# Patient Record
Sex: Female | Born: 1950 | Race: White | Hispanic: No | Marital: Married | Smoking: Former smoker
Health system: Southern US, Community
[De-identification: ages and names within clinical notes are randomized; demographics above are authoritative.]

## PROBLEM LIST (undated history)

## (undated) DIAGNOSIS — Z87442 Personal history of urinary calculi: Secondary | ICD-10-CM

## (undated) DIAGNOSIS — T8859XA Other complications of anesthesia, initial encounter: Secondary | ICD-10-CM

## (undated) DIAGNOSIS — K8020 Calculus of gallbladder without cholecystitis without obstruction: Secondary | ICD-10-CM

## (undated) DIAGNOSIS — Z8632 Personal history of gestational diabetes: Secondary | ICD-10-CM

## (undated) DIAGNOSIS — M3500 Sicca syndrome, unspecified: Secondary | ICD-10-CM

## (undated) DIAGNOSIS — M545 Other chronic pain: Secondary | ICD-10-CM

## (undated) DIAGNOSIS — G894 Chronic pain syndrome: Secondary | ICD-10-CM

## (undated) DIAGNOSIS — G8929 Other chronic pain: Secondary | ICD-10-CM

## (undated) DIAGNOSIS — T4145XA Adverse effect of unspecified anesthetic, initial encounter: Secondary | ICD-10-CM

## (undated) DIAGNOSIS — N189 Chronic kidney disease, unspecified: Secondary | ICD-10-CM

## (undated) DIAGNOSIS — K389 Disease of appendix, unspecified: Secondary | ICD-10-CM

## (undated) DIAGNOSIS — Z9889 Other specified postprocedural states: Secondary | ICD-10-CM

## (undated) DIAGNOSIS — M797 Fibromyalgia: Secondary | ICD-10-CM

## (undated) DIAGNOSIS — E079 Disorder of thyroid, unspecified: Secondary | ICD-10-CM

## (undated) DIAGNOSIS — G43909 Migraine, unspecified, not intractable, without status migrainosus: Secondary | ICD-10-CM

## (undated) DIAGNOSIS — R51 Headache: Secondary | ICD-10-CM

## (undated) DIAGNOSIS — K219 Gastro-esophageal reflux disease without esophagitis: Secondary | ICD-10-CM

## (undated) DIAGNOSIS — M1990 Unspecified osteoarthritis, unspecified site: Secondary | ICD-10-CM

## (undated) DIAGNOSIS — E7800 Pure hypercholesterolemia, unspecified: Secondary | ICD-10-CM

## (undated) DIAGNOSIS — R112 Nausea with vomiting, unspecified: Secondary | ICD-10-CM

## (undated) DIAGNOSIS — T7840XA Allergy, unspecified, initial encounter: Secondary | ICD-10-CM

## (undated) DIAGNOSIS — I10 Essential (primary) hypertension: Secondary | ICD-10-CM

## (undated) HISTORY — DX: Essential (primary) hypertension: I10

## (undated) HISTORY — PX: OTHER SURGICAL HISTORY: SHX169

## (undated) HISTORY — PX: APPENDECTOMY: SHX54

## (undated) HISTORY — DX: Migraine, unspecified, not intractable, without status migrainosus: G43.909

## (undated) HISTORY — DX: Chronic kidney disease, unspecified: N18.9

## (undated) HISTORY — DX: Pure hypercholesterolemia, unspecified: E78.00

## (undated) HISTORY — DX: Disorder of thyroid, unspecified: E07.9

## (undated) HISTORY — DX: Allergy, unspecified, initial encounter: T78.40XA

## (undated) HISTORY — DX: Gastro-esophageal reflux disease without esophagitis: K21.9

## (undated) HISTORY — DX: Unspecified osteoarthritis, unspecified site: M19.90

## (undated) HISTORY — DX: Headache: R51

---
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
## 1996-02-29 HISTORY — PX: ABDOMINAL HYSTERECTOMY: SHX81

## 2003-03-01 HISTORY — PX: LUMBAR FUSION: SHX111

## 2006-08-30 LAB — HM COLONOSCOPY: HM Colonoscopy: NORMAL

## 2009-02-28 HISTORY — PX: ANTERIOR CERVICAL DECOMP/DISCECTOMY FUSION: SHX1161

## 2009-02-28 HISTORY — PX: KIDNEY STONE SURGERY: SHX686

## 2011-06-30 ENCOUNTER — Other Ambulatory Visit (INDEPENDENT_AMBULATORY_CARE_PROVIDER_SITE_OTHER): Payer: BC Managed Care – PPO

## 2011-06-30 ENCOUNTER — Ambulatory Visit (INDEPENDENT_AMBULATORY_CARE_PROVIDER_SITE_OTHER): Payer: BC Managed Care – PPO | Admitting: Internal Medicine

## 2011-06-30 ENCOUNTER — Encounter: Payer: Self-pay | Admitting: Internal Medicine

## 2011-06-30 VITALS — BP 150/88 | HR 80 | Temp 97.7°F | Resp 16 | Ht 65.0 in | Wt 139.0 lb

## 2011-06-30 DIAGNOSIS — G44229 Chronic tension-type headache, not intractable: Secondary | ICD-10-CM

## 2011-06-30 DIAGNOSIS — IMO0002 Reserved for concepts with insufficient information to code with codable children: Secondary | ICD-10-CM

## 2011-06-30 DIAGNOSIS — J309 Allergic rhinitis, unspecified: Secondary | ICD-10-CM

## 2011-06-30 DIAGNOSIS — Z23 Encounter for immunization: Secondary | ICD-10-CM

## 2011-06-30 DIAGNOSIS — F419 Anxiety disorder, unspecified: Secondary | ICD-10-CM | POA: Insufficient documentation

## 2011-06-30 DIAGNOSIS — I10 Essential (primary) hypertension: Secondary | ICD-10-CM

## 2011-06-30 DIAGNOSIS — Z1231 Encounter for screening mammogram for malignant neoplasm of breast: Secondary | ICD-10-CM | POA: Insufficient documentation

## 2011-06-30 DIAGNOSIS — M3500 Sicca syndrome, unspecified: Secondary | ICD-10-CM

## 2011-06-30 DIAGNOSIS — E7800 Pure hypercholesterolemia, unspecified: Secondary | ICD-10-CM

## 2011-06-30 DIAGNOSIS — G4750 Parasomnia, unspecified: Secondary | ICD-10-CM | POA: Insufficient documentation

## 2011-06-30 DIAGNOSIS — F411 Generalized anxiety disorder: Secondary | ICD-10-CM

## 2011-06-30 LAB — URINALYSIS, ROUTINE W REFLEX MICROSCOPIC
Bilirubin Urine: NEGATIVE
Nitrite: NEGATIVE
pH: 6 (ref 5.0–8.0)

## 2011-06-30 LAB — COMPREHENSIVE METABOLIC PANEL
ALT: 17 U/L (ref 0–35)
AST: 25 U/L (ref 0–37)
Albumin: 4.9 g/dL (ref 3.5–5.2)
Calcium: 10.6 mg/dL — ABNORMAL HIGH (ref 8.4–10.5)
Chloride: 95 mEq/L — ABNORMAL LOW (ref 96–112)
Creatinine, Ser: 1.1 mg/dL (ref 0.4–1.2)
Potassium: 4.9 mEq/L (ref 3.5–5.1)
Sodium: 139 mEq/L (ref 135–145)
Total Protein: 7.9 g/dL (ref 6.0–8.3)

## 2011-06-30 LAB — CBC WITH DIFFERENTIAL/PLATELET
Basophils Absolute: 0.1 10*3/uL (ref 0.0–0.1)
Eosinophils Absolute: 0.2 10*3/uL (ref 0.0–0.7)
Lymphocytes Relative: 31.6 % (ref 12.0–46.0)
MCHC: 33.6 g/dL (ref 30.0–36.0)
Monocytes Relative: 6 % (ref 3.0–12.0)
Neutrophils Relative %: 60 % (ref 43.0–77.0)
Platelets: 267 10*3/uL (ref 150.0–400.0)
RDW: 14.2 % (ref 11.5–14.6)

## 2011-06-30 LAB — LDL CHOLESTEROL, DIRECT: Direct LDL: 184.5 mg/dL

## 2011-06-30 LAB — LIPID PANEL
Total CHOL/HDL Ratio: 4
Triglycerides: 164 mg/dL — ABNORMAL HIGH (ref 0.0–149.0)

## 2011-06-30 MED ORDER — CLONAZEPAM 2 MG PO TABS: 2.0000 mg | ORAL_TABLET | Freq: Every day | ORAL | Status: DC

## 2011-06-30 MED ORDER — OLMESARTAN-AMLODIPINE-HCTZ 40-5-12.5 MG PO TABS: 1.0000 | ORAL_TABLET | Freq: Every day | ORAL | Status: DC

## 2011-06-30 MED ORDER — BUTALBITAL-APAP-CAFFEINE 50-325-40 MG PO TABS: 1.0000 | ORAL_TABLET | Freq: Four times a day (QID) | ORAL | Status: DC | PRN

## 2011-06-30 MED ORDER — MOMETASONE FUROATE 50 MCG/ACT NA SUSP: 2.0000 | Freq: Every day | NASAL | Status: DC

## 2011-06-30 NOTE — Assessment & Plan Note (Signed)
Change her BP meds to tribenzor and today I will check her lytes and renal function and will look for secondary causes as well

## 2011-06-30 NOTE — Assessment & Plan Note (Signed)
Start nasonex 

## 2011-06-30 NOTE — Assessment & Plan Note (Signed)
For an FLP today 

## 2011-06-30 NOTE — Assessment & Plan Note (Signed)
Change to fioricet at her request

## 2011-06-30 NOTE — Assessment & Plan Note (Signed)
Rheum referral at her request 

## 2011-06-30 NOTE — Assessment & Plan Note (Signed)
Continue klonopin as needed 

## 2011-06-30 NOTE — Patient Instructions (Signed)

## 2011-06-30 NOTE — Progress Notes (Signed)
Subjective:    Patient ID: Mary [name], female    DOB: 07-Oct-1950, 61 y.o.   MRN: [id number]  HPI  New to me she has a complicated history and is currently seeing a psychologist, pain doc, endo, rheum, dermatologist. She comes in today seeking better control of her BP and she wants to change fiorinal to fioricet b/c the asa causes nausea. She has a long history of CDH- tension type. Her ha description, frequency, and severity have not changed. 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 She has sacro-iliac pain and recently received injections by Dr. Marilynn Latino in [place]. She was referred to a pain clinic for meds as well.  Review of Systems  Constitutional: Positive for fatigue. Negative for fever, chills, diaphoresis, activity change, appetite change and unexpected weight change.  HENT: Positive for congestion, rhinorrhea, sneezing, postnasal drip and tinnitus. Negative for hearing loss, ear pain, nosebleeds, sore throat, facial swelling, drooling, mouth sores, trouble swallowing, neck pain, neck stiffness, dental problem, voice change, sinus pressure and ear discharge.   Eyes: Positive for photophobia. Negative for pain, discharge, redness, itching and visual disturbance.  Respiratory: Negative for apnea, cough, choking, chest tightness, shortness of breath, wheezing and stridor.   Cardiovascular: Negative for chest pain, palpitations and leg swelling.  Gastrointestinal: Positive for nausea. Negative for vomiting, abdominal pain, diarrhea, constipation, blood in stool, abdominal distention, anal bleeding and rectal pain.  Genitourinary: Negative for dysuria, urgency, frequency, hematuria, flank pain, decreased urine volume, enuresis, difficulty  urinating and dyspareunia.  Musculoskeletal: Positive for myalgias, back pain and arthralgias. Negative for joint swelling and gait problem.  Skin: Negative for color change, pallor, rash and wound.  Neurological: Negative for dizziness, tremors, seizures, syncope, facial asymmetry, speech difficulty, weakness, light-headedness, numbness and headaches.  Hematological: Negative for adenopathy. Does not bruise/bleed easily.  Psychiatric/Behavioral: Positive for sleep disturbance. Negative for suicidal ideas, hallucinations, behavioral problems, confusion, self-injury, dysphoric mood, decreased concentration and agitation. The patient is nervous/anxious. The patient is not hyperactive.        Objective:   Physical Exam  Vitals reviewed. Constitutional: She is oriented to person, place, and time. She appears well-developed and well-nourished. No distress.  HENT:  Head: Normocephalic and atraumatic.  Mouth/Throat: Oropharynx is clear and moist. No oropharyngeal exudate.  Eyes: Conjunctivae are normal. Right eye exhibits no discharge. Left eye exhibits no discharge. No scleral icterus.  Neck: Normal range of motion. Neck supple. No JVD present. No tracheal deviation present. No thyromegaly present.  Cardiovascular: Normal rate, regular rhythm, normal heart sounds and intact distal pulses.  Exam reveals no gallop and no friction rub.   No murmur heard. Pulmonary/Chest: Effort normal and breath sounds normal. No stridor. No respiratory distress. She has no wheezes. She has no rales. She exhibits no tenderness.  Abdominal: Soft. Bowel sounds are normal. She exhibits no distension and no mass. There is no tenderness. There is no rebound and no guarding.  Musculoskeletal: Normal range of motion. She exhibits no edema and no tenderness.  Lymphadenopathy:    She has no cervical adenopathy.  Neurological: She is alert and oriented to person, place, and time. She has normal reflexes. She displays normal  reflexes. She exhibits normal muscle tone. Coordination normal.  Skin:  Skin is warm and dry. No rash noted. She is not diaphoretic. No erythema. No pallor.  Psychiatric: She has a normal mood and affect. Her behavior is normal. Judgment and thought content normal.          Assessment & Plan:

## 2011-07-01 ENCOUNTER — Encounter: Payer: Self-pay | Admitting: Internal Medicine

## 2011-07-04 ENCOUNTER — Telehealth: Payer: Self-pay | Admitting: Internal Medicine

## 2011-07-04 NOTE — Telephone Encounter (Signed)
What is her BP ?

## 2011-07-04 NOTE — Telephone Encounter (Signed)
Pt says she had bad reaction to tribenzor--dizziness weak tired heart rate fast for 3 days--pt ph# [phone]

## 2011-07-11 NOTE — Telephone Encounter (Signed)
Closing phone note, no call back from pt

## 2011-07-13 ENCOUNTER — Telehealth: Payer: Self-pay | Admitting: *Deleted

## 2011-07-13 NOTE — Telephone Encounter (Signed)
Pt states that Dr. Yetta Barre recently changed her BP medication from Lisinopril to Tribenzor and that she had a reaction when she started to take Tribenzor (dizziness, palpitations, weakness). Pt stopped taking the Tribenzor and started to take Lisinopril-HCTZ 20-25 again. Pt has only a few days of pills left of the Lisinopril and wants to know if she should continue taking the medication and if a refill can be sent to her pharmacy. Please advise in TLJ's absence.

## 2011-07-14 MED ORDER — LISINOPRIL-HYDROCHLOROTHIAZIDE 20-25 MG PO TABS: 1.0000 | ORAL_TABLET | Freq: Every day | ORAL | Status: DC

## 2011-07-14 NOTE — Telephone Encounter (Signed)
Pt informed of Rx/pharmacy 

## 2011-07-14 NOTE — Telephone Encounter (Signed)
Ok to refill Lisinopr HCT #30 with 3 ref OV w/Dr Geralyn Flash

## 2011-07-14 NOTE — Telephone Encounter (Signed)
Left message for pt to callback office.  

## 2011-08-01 ENCOUNTER — Ambulatory Visit (HOSPITAL_COMMUNITY)
Admission: RE | Admit: 2011-08-01 | Discharge: 2011-08-01 | Disposition: A | Discharge status: Home / Self Care | Payer: BC Managed Care – PPO | Source: Ambulatory Visit | Attending: Internal Medicine | Admitting: Internal Medicine

## 2011-08-01 DIAGNOSIS — Z1231 Encounter for screening mammogram for malignant neoplasm of breast: Secondary | ICD-10-CM | POA: Insufficient documentation

## 2011-08-10 LAB — HM MAMMOGRAPHY

## 2011-08-11 ENCOUNTER — Encounter: Payer: Self-pay | Admitting: Internal Medicine

## 2011-08-11 ENCOUNTER — Ambulatory Visit (INDEPENDENT_AMBULATORY_CARE_PROVIDER_SITE_OTHER): Payer: BC Managed Care – PPO | Admitting: Internal Medicine

## 2011-08-11 VITALS — BP 128/72 | HR 76 | Temp 97.8°F | Resp 16 | Wt 136.5 lb

## 2011-08-11 DIAGNOSIS — G4750 Parasomnia, unspecified: Secondary | ICD-10-CM

## 2011-08-11 DIAGNOSIS — E7800 Pure hypercholesterolemia, unspecified: Secondary | ICD-10-CM

## 2011-08-11 DIAGNOSIS — M549 Dorsalgia, unspecified: Secondary | ICD-10-CM

## 2011-08-11 DIAGNOSIS — R400 Somnolence: Secondary | ICD-10-CM

## 2011-08-11 DIAGNOSIS — I10 Essential (primary) hypertension: Secondary | ICD-10-CM

## 2011-08-11 DIAGNOSIS — IMO0002 Reserved for concepts with insufficient information to code with codable children: Secondary | ICD-10-CM

## 2011-08-11 DIAGNOSIS — R404 Transient alteration of awareness: Secondary | ICD-10-CM

## 2011-08-11 DIAGNOSIS — G47419 Narcolepsy without cataplexy: Secondary | ICD-10-CM | POA: Insufficient documentation

## 2011-08-11 DIAGNOSIS — M3500 Sicca syndrome, unspecified: Secondary | ICD-10-CM

## 2011-08-11 DIAGNOSIS — G8929 Other chronic pain: Secondary | ICD-10-CM

## 2011-08-11 MED ORDER — MODAFINIL 100 MG PO TABS: 100.0000 mg | ORAL_TABLET | Freq: Every day | ORAL | Status: DC

## 2011-08-11 NOTE — Progress Notes (Signed)
Subjective:    Patient ID: Mary [name], female    DOB: 1950-07-29, 61 y.o.   MRN: [id number]  Hypertension This is a chronic problem. The current episode started more than 1 year ago. The problem has been gradually improving since onset. The problem is controlled. Pertinent negatives include no anxiety, blurred vision, chest pain, headaches, malaise/fatigue, neck pain, orthopnea, palpitations, peripheral edema, PND, shortness of breath or sweats. Agents associated with hypertension include amphetamines. Past treatments include ACE inhibitors and diuretics. The current treatment provides significant improvement. There are no compliance problems.       Review of Systems  Constitutional: Negative for fever, chills, malaise/fatigue, diaphoresis, activity change, appetite change, fatigue and unexpected weight change.  HENT: Negative.  Negative for neck pain.   Eyes: Negative.  Negative for blurred vision.  Respiratory: Negative for apnea, cough, choking, chest tightness, shortness of breath, wheezing and stridor.   Cardiovascular: Negative for chest pain, palpitations, orthopnea, leg swelling and PND.  Gastrointestinal: Negative for nausea, vomiting, abdominal pain, diarrhea, constipation, blood in stool, abdominal distention, anal bleeding and rectal pain.  Genitourinary: Negative.   Musculoskeletal: Positive for back pain (chronic, unchanged). Negative for myalgias, joint swelling, arthralgias and gait problem.  Skin: Negative for color change, pallor, rash and wound.  Neurological: Negative for dizziness, tremors, seizures, syncope, facial asymmetry, speech difficulty, weakness, light-headedness, numbness and headaches.  Hematological: Negative for adenopathy. Does not bruise/bleed easily.  Psychiatric/Behavioral: Positive for disturbed wake/sleep cycle (sleep disturbance at night and excessively sleep during the day) and decreased concentration. Negative for suicidal ideas, hallucinations,  behavioral problems, confusion, self-injury, dysphoric mood and agitation. The patient is not nervous/anxious and is not hyperactive.        Objective:   Physical Exam  Vitals reviewed. Constitutional: She is oriented to person, place, and time. She appears well-developed and well-nourished. No distress.  HENT:  Head: Normocephalic and atraumatic.  Mouth/Throat: Oropharynx is clear and moist. No oropharyngeal exudate.  Eyes: Conjunctivae are normal. Right eye exhibits no discharge. Left eye exhibits no discharge. No scleral icterus.  Neck: Normal range of motion. Neck supple. No JVD present. No tracheal deviation present. No thyromegaly present.  Cardiovascular: Normal rate, regular rhythm, normal heart sounds and intact distal pulses.  Exam reveals no gallop and no friction rub.   No murmur heard. Pulmonary/Chest: Effort normal and breath sounds normal. No stridor. No respiratory distress. She has no wheezes. She has no rales. She exhibits no tenderness.  Abdominal: Soft. Bowel sounds are normal. She exhibits no distension. There is no tenderness. There is no rebound and no guarding.  Musculoskeletal: Normal range of motion. She exhibits no edema and no tenderness.  Lymphadenopathy:    She has no cervical adenopathy.  Neurological: She is oriented to person, place, and time.  Skin: Skin is warm and dry. No rash noted. She is not diaphoretic. No erythema. No pallor.  Psychiatric: She has a normal mood and affect. Her behavior is normal. Judgment and thought content normal.      Lab Results  Component Value Date   WBC 10.4 06/30/2011   HGB 14.8 06/30/2011   HCT 44.1 06/30/2011   PLT 267.0 06/30/2011   GLUCOSE 109* 06/30/2011   CHOL 267* 06/30/2011   TRIG 164.0* 06/30/2011   HDL 72.80 06/30/2011   LDLDIRECT 184.5 06/30/2011   ALT 17 06/30/2011   AST 25 06/30/2011   NA 139 06/30/2011   K 4.9 06/30/2011   CL 95* 06/30/2011   CREATININE 1.1 06/30/2011  BUN 32* 06/30/2011   CO2 31 06/30/2011   TSH 1.23  06/30/2011      Assessment & Plan:

## 2011-08-11 NOTE — Assessment & Plan Note (Addendum)
No treatment for now at her request, she will improve on lifestyle modifications with diet, exercise, and weight loss

## 2011-08-11 NOTE — Assessment & Plan Note (Signed)
She uses provigil for this and it works well

## 2011-08-11 NOTE — Patient Instructions (Signed)

## 2011-08-11 NOTE — Assessment & Plan Note (Signed)
She wants to see a rheumatologist

## 2011-08-11 NOTE — Assessment & Plan Note (Signed)
Pain clinic referral 

## 2011-08-11 NOTE — Assessment & Plan Note (Signed)
Her BP is well controlled 

## 2011-08-12 ENCOUNTER — Other Ambulatory Visit: Payer: Self-pay | Admitting: Internal Medicine

## 2011-08-12 DIAGNOSIS — R928 Other abnormal and inconclusive findings on diagnostic imaging of breast: Secondary | ICD-10-CM

## 2011-08-19 ENCOUNTER — Encounter: Payer: Self-pay | Admitting: Physical Medicine and Rehabilitation

## 2011-08-22 ENCOUNTER — Ambulatory Visit
Admission: RE | Admit: 2011-08-22 | Discharge: 2011-08-22 | Disposition: A | Discharge status: Home / Self Care | Payer: BC Managed Care – PPO | Source: Ambulatory Visit | Attending: Internal Medicine | Admitting: Internal Medicine

## 2011-08-22 ENCOUNTER — Other Ambulatory Visit: Payer: Self-pay | Admitting: Internal Medicine

## 2011-08-22 DIAGNOSIS — R928 Other abnormal and inconclusive findings on diagnostic imaging of breast: Secondary | ICD-10-CM

## 2011-08-26 ENCOUNTER — Ambulatory Visit: Payer: BC Managed Care – PPO | Admitting: Physical Medicine and Rehabilitation

## 2011-09-06 ENCOUNTER — Other Ambulatory Visit: Payer: Self-pay

## 2011-09-06 ENCOUNTER — Other Ambulatory Visit: Payer: Self-pay | Admitting: Internal Medicine

## 2011-09-06 MED ORDER — LISINOPRIL-HYDROCHLOROTHIAZIDE 20-25 MG PO TABS: 1.0000 | ORAL_TABLET | Freq: Every day | ORAL | Status: DC

## 2011-09-06 NOTE — Telephone Encounter (Signed)
Patient husband was in our office today for appointment and requested refill to [name] mail order for her lisinopril.

## 2011-09-12 ENCOUNTER — Other Ambulatory Visit: Payer: Self-pay

## 2011-09-12 MED ORDER — CLOBETASOL PROPIONATE 0.05 % EX CREA: TOPICAL_CREAM | Freq: Two times a day (BID) | CUTANEOUS | Status: DC

## 2011-09-21 ENCOUNTER — Encounter
Payer: BC Managed Care – PPO | Attending: Physical Medicine and Rehabilitation | Admitting: Physical Medicine and Rehabilitation

## 2011-09-21 ENCOUNTER — Encounter: Payer: Self-pay | Admitting: Physical Medicine and Rehabilitation

## 2011-09-21 VITALS — BP 126/70 | HR 80 | Resp 14 | Ht 65.0 in | Wt 134.0 lb

## 2011-09-21 DIAGNOSIS — M79609 Pain in unspecified limb: Secondary | ICD-10-CM | POA: Insufficient documentation

## 2011-09-21 DIAGNOSIS — M25529 Pain in unspecified elbow: Secondary | ICD-10-CM | POA: Insufficient documentation

## 2011-09-21 DIAGNOSIS — M79643 Pain in unspecified hand: Secondary | ICD-10-CM

## 2011-09-21 DIAGNOSIS — M6250 Muscle wasting and atrophy, not elsewhere classified, unspecified site: Secondary | ICD-10-CM | POA: Insufficient documentation

## 2011-09-21 DIAGNOSIS — M2170 Unequal limb length (acquired), unspecified site: Secondary | ICD-10-CM

## 2011-09-21 DIAGNOSIS — E119 Type 2 diabetes mellitus without complications: Secondary | ICD-10-CM | POA: Insufficient documentation

## 2011-09-21 DIAGNOSIS — R51 Headache: Secondary | ICD-10-CM | POA: Insufficient documentation

## 2011-09-21 DIAGNOSIS — N189 Chronic kidney disease, unspecified: Secondary | ICD-10-CM | POA: Insufficient documentation

## 2011-09-21 DIAGNOSIS — M25569 Pain in unspecified knee: Secondary | ICD-10-CM | POA: Insufficient documentation

## 2011-09-21 DIAGNOSIS — M25519 Pain in unspecified shoulder: Secondary | ICD-10-CM | POA: Insufficient documentation

## 2011-09-21 DIAGNOSIS — M545 Low back pain, unspecified: Secondary | ICD-10-CM | POA: Insufficient documentation

## 2011-09-21 DIAGNOSIS — Z981 Arthrodesis status: Secondary | ICD-10-CM | POA: Insufficient documentation

## 2011-09-21 DIAGNOSIS — K219 Gastro-esophageal reflux disease without esophagitis: Secondary | ICD-10-CM | POA: Insufficient documentation

## 2011-09-21 DIAGNOSIS — Z86711 Personal history of pulmonary embolism: Secondary | ICD-10-CM | POA: Insufficient documentation

## 2011-09-21 DIAGNOSIS — M542 Cervicalgia: Secondary | ICD-10-CM

## 2011-09-21 NOTE — Patient Instructions (Addendum)
1. Obtain notes from previous pain management  2. Consider cervical and lumbar flexion extension films  3. Consider moving toward education on proper body mechanics, posture, core strengthening, evaluate leg length discrepancy further   4. Occupational therapy for education on pacing and joint protection techniques.  5. Physical therapy to address bilateral knee pain with an emphasis on quadriceps strengthening.  6. I understand your getting sacroiliac joint injections at an outside facility which are working out well for you.  7. Consider hand x rays  8. Non narcotic management with emphasis on rehabilitation medicine
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
## 2011-09-22 ENCOUNTER — Telehealth: Payer: Self-pay

## 2011-09-22 NOTE — Telephone Encounter (Signed)
Pt called and would like to talk to someone about getting her records.  She would like to explain some things that may be in them. ( FYI -UDS was not done on pt because she said we were not going to manage her medications.)

## 2011-09-22 NOTE — Telephone Encounter (Signed)
Spoke with pt regarding her concern.  Records will not be requested at this time.  She is not sure what she wants to do treatment wise.

## 2011-09-30 ENCOUNTER — Telehealth: Payer: Self-pay | Admitting: Internal Medicine

## 2011-09-30 NOTE — Telephone Encounter (Signed)
Caller: Tegan/Patient; PCP: Sanda Linger; CB#: [phone];  Call regarding Frequent  Migraine Headaches With Auras and Neck Spasms;  Onset: 2003.  Temp not taken; was 99.2 po at 1800 09/29/11.  Hysterectomy. Reported 13 migraines with auras; sometimes 2/day from 09/07/11 to 09/29/11.  Dr Yetta Barre referred to pain clinic; seen at pain clinic 09/21/11 but did not like it because they wanted to control all her meds and begin rehab, xrays and PT that's been done before without help. Asking for referral to different pain clinic/ MD who does Botox.  Advised to  see MD within 72 hrs for history of arthritis in neck or headache triggered by movement or positioning of head or neck per Headaches Guideline.  Instructed to call  10/03/11 for appt.

## 2011-10-14 ENCOUNTER — Encounter: Payer: BC Managed Care – PPO | Admitting: Physical Medicine and Rehabilitation

## 2011-12-15 ENCOUNTER — Ambulatory Visit: Payer: BC Managed Care – PPO | Admitting: Internal Medicine

## 2012-01-03 ENCOUNTER — Telehealth: Payer: Self-pay | Admitting: Internal Medicine

## 2012-01-03 NOTE — Telephone Encounter (Signed)
The patient is hoping to get her no show fee removed.  She stated pressed the "cancel apt" option on the automated call.

## 2012-01-09 NOTE — Telephone Encounter (Signed)
Fee removed, pt informed °

## 2012-01-16 ENCOUNTER — Other Ambulatory Visit (INDEPENDENT_AMBULATORY_CARE_PROVIDER_SITE_OTHER): Payer: BC Managed Care – PPO

## 2012-01-16 ENCOUNTER — Ambulatory Visit (INDEPENDENT_AMBULATORY_CARE_PROVIDER_SITE_OTHER): Payer: BC Managed Care – PPO | Admitting: Internal Medicine

## 2012-01-16 ENCOUNTER — Encounter: Payer: Self-pay | Admitting: Internal Medicine

## 2012-01-16 VITALS — BP 132/62 | HR 58 | Temp 97.9°F | Resp 14 | Ht 65.0 in | Wt 133.2 lb

## 2012-01-16 DIAGNOSIS — M8580 Other specified disorders of bone density and structure, unspecified site: Secondary | ICD-10-CM | POA: Insufficient documentation

## 2012-01-16 DIAGNOSIS — Z23 Encounter for immunization: Secondary | ICD-10-CM

## 2012-01-16 DIAGNOSIS — I10 Essential (primary) hypertension: Secondary | ICD-10-CM

## 2012-01-16 DIAGNOSIS — M899 Disorder of bone, unspecified: Secondary | ICD-10-CM

## 2012-01-16 DIAGNOSIS — R739 Hyperglycemia, unspecified: Secondary | ICD-10-CM | POA: Insufficient documentation

## 2012-01-16 DIAGNOSIS — R7309 Other abnormal glucose: Secondary | ICD-10-CM

## 2012-01-16 LAB — BASIC METABOLIC PANEL
BUN: 25 mg/dL — ABNORMAL HIGH (ref 6–23)
Chloride: 99 mEq/L (ref 96–112)
GFR: 56.2 mL/min — ABNORMAL LOW (ref >60.00)
Potassium: 4.4 mEq/L (ref 3.5–5.1)
Sodium: 137 mEq/L (ref 135–145)

## 2012-01-16 LAB — HEMOGLOBIN A1C: Hgb A1c MFr Bld: 6.1 % (ref 4.6–6.5)

## 2012-01-16 NOTE — Progress Notes (Signed)
Subjective:    Patient ID: Mary [name], female    DOB: 1950-04-09, 61 y.o.   MRN: [id number]  Hypertension This is a chronic problem. The current episode started more than 1 year ago. The problem has been gradually improving since onset. The problem is controlled. Associated symptoms include anxiety. Pertinent negatives include no blurred vision, chest pain, headaches, malaise/fatigue, neck pain, orthopnea, palpitations, peripheral edema, PND, shortness of breath or sweats. There are no associated agents to hypertension. Past treatments include ACE inhibitors and diuretics. The current treatment provides significant improvement. Compliance problems include exercise and diet.       Review of Systems  Constitutional: Negative for fever, chills, malaise/fatigue, diaphoresis, activity change, appetite change, fatigue and unexpected weight change.  HENT: Positive for congestion, rhinorrhea, sneezing and postnasal drip. Negative for hearing loss, ear pain, nosebleeds, sore throat, facial swelling, drooling, mouth sores, trouble swallowing, neck pain, neck stiffness, dental problem, voice change, sinus pressure, tinnitus and ear discharge.   Eyes: Negative.  Negative for blurred vision.  Respiratory: Negative for cough, chest tightness, shortness of breath, wheezing and stridor.   Cardiovascular: Negative for chest pain, palpitations, orthopnea, leg swelling and PND.  Gastrointestinal: Negative for nausea, abdominal pain, diarrhea, constipation and blood in stool.  Genitourinary: Negative.   Musculoskeletal: Negative for myalgias, back pain, joint swelling, arthralgias and gait problem.  Skin: Negative for color change, pallor, rash and wound.  Neurological: Negative for dizziness, tremors, seizures, syncope, facial asymmetry, speech difficulty, weakness, light-headedness, numbness and headaches.  Hematological: Negative for adenopathy. Does not bruise/bleed easily.  Psychiatric/Behavioral:  Negative.        Objective:   Physical Exam  Vitals reviewed. Constitutional: She is oriented to person, place, and time. She appears well-developed and well-nourished. No distress.  HENT:  Head: Normocephalic and atraumatic.  Mouth/Throat: Oropharynx is clear and moist. No oropharyngeal exudate.  Eyes: Conjunctivae normal are normal. Right eye exhibits no discharge. Left eye exhibits no discharge. No scleral icterus.  Neck: Normal range of motion. Neck supple. No JVD present. No tracheal deviation present. No thyromegaly present.  Cardiovascular: Normal rate, regular rhythm, normal heart sounds and intact distal pulses.  Exam reveals no gallop and no friction rub.   No murmur heard. Pulmonary/Chest: Effort normal and breath sounds normal. No stridor. No respiratory distress. She has no wheezes. She has no rales. She exhibits no tenderness.  Abdominal: Soft. Bowel sounds are normal. She exhibits no distension and no mass. There is no tenderness. There is no rebound and no guarding.  Musculoskeletal: Normal range of motion. She exhibits no edema and no tenderness.  Lymphadenopathy:    She has no cervical adenopathy.  Neurological: She is oriented to person, place, and time.  Skin: Skin is warm and dry. No rash noted. She is not diaphoretic. No erythema. No pallor.  Psychiatric: She has a normal mood and affect. Her behavior is normal. Judgment and thought content normal.     Lab Results  Component Value Date   WBC 10.4 06/30/2011   HGB 14.8 06/30/2011   HCT 44.1 06/30/2011   PLT 267.0 06/30/2011   GLUCOSE 109* 06/30/2011   CHOL 267* 06/30/2011   TRIG 164.0* 06/30/2011   HDL 72.80 06/30/2011   LDLDIRECT 184.5 06/30/2011   ALT 17 06/30/2011   AST 25 06/30/2011   NA 139 06/30/2011   K 4.9 06/30/2011   CL 95* 06/30/2011   CREATININE 1.1 06/30/2011   BUN 32* 06/30/2011   CO2 31 06/30/2011   TSH 1.23  06/30/2011       Assessment & Plan:

## 2012-01-16 NOTE — Patient Instructions (Signed)

## 2012-01-17 ENCOUNTER — Encounter: Payer: Self-pay | Admitting: Internal Medicine

## 2012-01-17 NOTE — Assessment & Plan Note (Signed)
I will check her a1c to see if she has developed DM II 

## 2012-01-17 NOTE — Assessment & Plan Note (Signed)
Her BP is well controlled, I will check her lytes and renal function 

## 2012-01-17 NOTE — Assessment & Plan Note (Signed)
She needs a f/up DEXA scan 

## 2012-02-16 ENCOUNTER — Telehealth: Payer: Self-pay | Admitting: Internal Medicine

## 2012-02-16 ENCOUNTER — Telehealth: Payer: Self-pay

## 2012-02-16 DIAGNOSIS — F419 Anxiety disorder, unspecified: Secondary | ICD-10-CM

## 2012-02-16 DIAGNOSIS — G4750 Parasomnia, unspecified: Secondary | ICD-10-CM

## 2012-02-16 MED ORDER — CLONAZEPAM 2 MG PO TABS: 2.0000 mg | ORAL_TABLET | Freq: Every day | ORAL | Status: DC

## 2012-02-16 NOTE — Telephone Encounter (Signed)
ok 

## 2012-02-16 NOTE — Telephone Encounter (Signed)
Patient Information:  Caller Name: Mellissa Kohut  Phone: [phone]  Patient: Mary [name]  Gender: Female  DOB: 10/17/2010  Age: 61 Years  PCP: Jeoffrey Massed  Office Follow Up:  Does the office need to follow up with this patient?: No  Instructions For The Office: N/A  RN Note:  Coughing spasms, "choking" on mucus.  Cough has made her vomit.  Call is second call in 24 hours; per protocol, advised appt within 4 hours.  Appt scheduled 02/16/12 1550 with Dr. Karma Greaser.  krs/can  Symptoms  Reason For Call & Symptoms: cold symptoms, congestion, cough  Reviewed Health History In EMR: Yes  Reviewed Medications In EMR: Yes  Reviewed Allergies In EMR: Yes  Reviewed Surgeries / Procedures: Yes  Date of Onset of Symptoms: 02/15/2012  Weight: N/A  Guideline(s) Used:  Cough  Disposition Per Guideline:   See Today in Office  Reason For Disposition Reached:   Continuous (nonstop) coughing  Advice Given:  N/A  Appointment Scheduled:  02/16/2012 15:50:00 Appointment Scheduled Provider:  Jaynie Collins

## 2012-02-16 NOTE — Telephone Encounter (Signed)
Received fax from [facility] requesting 90day supply with appropriate refills on  clonazepam 2 mg. Thanks

## 2012-03-13 ENCOUNTER — Inpatient Hospital Stay: Admission: RE | Admit: 2012-03-13 | Payer: BC Managed Care – PPO | Source: Ambulatory Visit

## 2012-05-14 ENCOUNTER — Ambulatory Visit: Payer: BC Managed Care – PPO | Admitting: Internal Medicine

## 2012-05-16 ENCOUNTER — Ambulatory Visit (INDEPENDENT_AMBULATORY_CARE_PROVIDER_SITE_OTHER): Payer: BC Managed Care – PPO | Admitting: Internal Medicine

## 2012-05-16 ENCOUNTER — Encounter: Payer: Self-pay | Admitting: Internal Medicine

## 2012-05-16 ENCOUNTER — Other Ambulatory Visit (INDEPENDENT_AMBULATORY_CARE_PROVIDER_SITE_OTHER): Payer: BC Managed Care – PPO

## 2012-05-16 VITALS — BP 114/72 | HR 64 | Temp 98.7°F | Resp 16 | Wt 134.0 lb

## 2012-05-16 DIAGNOSIS — G4750 Parasomnia, unspecified: Secondary | ICD-10-CM

## 2012-05-16 DIAGNOSIS — F411 Generalized anxiety disorder: Secondary | ICD-10-CM

## 2012-05-16 DIAGNOSIS — F419 Anxiety disorder, unspecified: Secondary | ICD-10-CM

## 2012-05-16 DIAGNOSIS — R7309 Other abnormal glucose: Secondary | ICD-10-CM

## 2012-05-16 DIAGNOSIS — E7800 Pure hypercholesterolemia, unspecified: Secondary | ICD-10-CM

## 2012-05-16 DIAGNOSIS — IMO0002 Reserved for concepts with insufficient information to code with codable children: Secondary | ICD-10-CM

## 2012-05-16 DIAGNOSIS — I10 Essential (primary) hypertension: Secondary | ICD-10-CM

## 2012-05-16 LAB — CBC WITH DIFFERENTIAL/PLATELET
Basophils Relative: 0.7 % (ref 0.0–3.0)
Eosinophils Relative: 2.2 % (ref 0.0–5.0)
HCT: 40.7 % (ref 36.0–46.0)
Monocytes Relative: 4.9 % (ref 3.0–12.0)
Neutrophils Relative %: 63.3 % (ref 43.0–77.0)
Platelets: 288 10*3/uL (ref 150.0–400.0)
RBC: 4.65 Mil/uL (ref 3.87–5.11)
WBC: 8.6 10*3/uL (ref 4.5–10.5)

## 2012-05-16 LAB — COMPREHENSIVE METABOLIC PANEL
Albumin: 4.6 g/dL (ref 3.5–5.2)
Alkaline Phosphatase: 60 U/L (ref 39–117)
BUN: 21 mg/dL (ref 6–23)
CO2: 34 mEq/L — ABNORMAL HIGH (ref 19–32)
GFR: 51.62 mL/min — ABNORMAL LOW (ref >60.00)
Glucose, Bld: 108 mg/dL — ABNORMAL HIGH (ref 70–99)
Sodium: 138 mEq/L (ref 135–145)
Total Bilirubin: 0.9 mg/dL (ref 0.3–1.2)
Total Protein: 8.1 g/dL (ref 6.0–8.3)

## 2012-05-16 LAB — LDL CHOLESTEROL, DIRECT: Direct LDL: 133.4 mg/dL

## 2012-05-16 LAB — URINALYSIS, ROUTINE W REFLEX MICROSCOPIC
Ketones, ur: NEGATIVE
Nitrite: NEGATIVE
Specific Gravity, Urine: <=1.005 (ref 1.000–1.030)
Urobilinogen, UA: 0.2 (ref 0.0–1.0)
pH: 6 (ref 5.0–8.0)

## 2012-05-16 LAB — LIPID PANEL
Cholesterol: 287 mg/dL — ABNORMAL HIGH (ref 0–200)
Total CHOL/HDL Ratio: 5
Triglycerides: 495 mg/dL — ABNORMAL HIGH (ref 0.0–149.0)
VLDL: 99 mg/dL — ABNORMAL HIGH (ref 0.0–40.0)

## 2012-05-16 LAB — TSH: TSH: 1.06 u[IU]/mL (ref 0.35–5.50)

## 2012-05-16 MED ORDER — CLONAZEPAM 2 MG PO TABS: 2.0000 mg | ORAL_TABLET | Freq: Two times a day (BID) | ORAL | Status: DC | PRN

## 2012-05-16 NOTE — Progress Notes (Signed)
Subjective:    Patient ID: Mary [name], female    DOB: Jul 23, 1950, 62 y.o.   MRN: [id number]  Hypertension This is a chronic problem. The current episode started more than 1 year ago. The problem has been gradually improving since onset. The problem is controlled. Associated symptoms include anxiety. Pertinent negatives include no blurred vision, chest pain, headaches, malaise/fatigue, neck pain, orthopnea, palpitations, peripheral edema, PND, shortness of breath or sweats. Past treatments include ACE inhibitors and diuretics. The current treatment provides significant improvement. There are no compliance problems.       Review of Systems  Constitutional: Negative.  Negative for fever, chills, malaise/fatigue, diaphoresis, activity change, appetite change, fatigue and unexpected weight change.  HENT: Negative.  Negative for neck pain.   Eyes: Negative.  Negative for blurred vision.  Respiratory: Negative.  Negative for cough, choking, chest tightness, shortness of breath, wheezing and stridor.   Cardiovascular: Negative.  Negative for chest pain, palpitations, orthopnea, leg swelling and PND.  Gastrointestinal: Negative.  Negative for nausea, vomiting, abdominal pain, diarrhea and constipation.  Endocrine: Negative.   Genitourinary: Negative.   Musculoskeletal: Positive for back pain (chronic,unchanged). Negative for myalgias, joint swelling and gait problem.  Skin: Negative.  Negative for color change, pallor, rash and wound.  Allergic/Immunologic: Negative.   Neurological: Negative.  Negative for dizziness, tremors, seizures, syncope, facial asymmetry, speech difficulty, weakness, light-headedness, numbness and headaches.  Hematological: Negative.  Negative for adenopathy. Does not bruise/bleed easily.  Psychiatric/Behavioral: Positive for sleep disturbance. Negative for suicidal ideas, hallucinations, behavioral problems, confusion, self-injury, dysphoric mood, decreased concentration  and agitation. The patient is nervous/anxious. The patient is not hyperactive.        Objective:   Physical Exam  Vitals reviewed. Constitutional: She is oriented to person, place, and time. She appears well-developed and well-nourished. No distress.  HENT:  Head: Normocephalic and atraumatic.  Mouth/Throat: Oropharynx is clear and moist. No oropharyngeal exudate.  Eyes: Conjunctivae are normal. Right eye exhibits no discharge. Left eye exhibits no discharge. No scleral icterus.  Neck: Normal range of motion. Neck supple. No JVD present. No tracheal deviation present. No thyromegaly present.  Cardiovascular: Normal rate, regular rhythm, normal heart sounds and intact distal pulses.  Exam reveals no gallop and no friction rub.   No murmur heard. Pulmonary/Chest: Effort normal and breath sounds normal. No stridor. No respiratory distress. She has no wheezes. She has no rales. She exhibits no tenderness.  Abdominal: Soft. Bowel sounds are normal. She exhibits no distension and no mass. There is no tenderness. There is no rebound and no guarding.  Musculoskeletal: Normal range of motion. She exhibits no edema and no tenderness.  Lymphadenopathy:    She has no cervical adenopathy.  Neurological: She is oriented to person, place, and time.  Skin: Skin is warm and dry. No rash noted. She is not diaphoretic. No erythema. No pallor.  Psychiatric: She has a normal mood and affect. Her behavior is normal. Judgment and thought content normal.      Lab Results  Component Value Date   WBC 10.4 06/30/2011   HGB 14.8 06/30/2011   HCT 44.1 06/30/2011   PLT 267.0 06/30/2011   GLUCOSE 104* 01/16/2012   CHOL 267* 06/30/2011   TRIG 164.0* 06/30/2011   HDL 72.80 06/30/2011   LDLDIRECT 184.5 06/30/2011   ALT 17 06/30/2011   AST 25 06/30/2011   NA 137 01/16/2012   K 4.4 01/16/2012   CL 99 01/16/2012   CREATININE 1.1 01/16/2012   BUN 25*  01/16/2012   CO2 31 01/16/2012   TSH 1.23 06/30/2011   HGBA1C 6.1 01/16/2012       Assessment & Plan:

## 2012-05-16 NOTE — Assessment & Plan Note (Signed)
She will continue klonopin She is not willing to start an SSRI

## 2012-05-16 NOTE — Assessment & Plan Note (Signed)
Repeat FLP today.

## 2012-05-16 NOTE — Patient Instructions (Signed)

## 2012-05-16 NOTE — Assessment & Plan Note (Signed)
Her BP is well controlled Today I will check her lytes and renal function 

## 2012-05-16 NOTE — Assessment & Plan Note (Signed)
I will check her a1c to see if she has developed DM2

## 2012-05-17 ENCOUNTER — Encounter: Payer: Self-pay | Admitting: Internal Medicine

## 2012-08-07 ENCOUNTER — Other Ambulatory Visit: Payer: Self-pay | Admitting: Internal Medicine

## 2012-08-07 DIAGNOSIS — I10 Essential (primary) hypertension: Secondary | ICD-10-CM

## 2012-08-07 MED ORDER — LISINOPRIL-HYDROCHLOROTHIAZIDE 20-25 MG PO TABS: 1.0000 | ORAL_TABLET | Freq: Every day | ORAL | Status: DC

## 2012-08-29 ENCOUNTER — Telehealth: Payer: Self-pay | Admitting: *Deleted

## 2012-08-29 DIAGNOSIS — G4750 Parasomnia, unspecified: Secondary | ICD-10-CM

## 2012-08-29 DIAGNOSIS — F419 Anxiety disorder, unspecified: Secondary | ICD-10-CM

## 2012-08-29 DIAGNOSIS — G44229 Chronic tension-type headache, not intractable: Secondary | ICD-10-CM

## 2012-08-29 MED ORDER — BUTALBITAL-APAP-CAFFEINE 50-325-40 MG PO TABS: 1.0000 | ORAL_TABLET | Freq: Four times a day (QID) | ORAL | Status: AC | PRN

## 2012-08-29 MED ORDER — CLONAZEPAM 2 MG PO TABS: 2.0000 mg | ORAL_TABLET | Freq: Two times a day (BID) | ORAL | Status: DC | PRN

## 2012-08-29 NOTE — Telephone Encounter (Signed)
Pt called requesting refill on Butalbital APAP (Fioricet) 50-325mg  and Clonazepam 2mg . Please advise

## 2012-08-29 NOTE — Telephone Encounter (Signed)
ok 

## 2012-08-30 NOTE — Telephone Encounter (Signed)
Rx faxed to pharmacy by Alvy Beal, CMA

## 2012-11-15 ENCOUNTER — Ambulatory Visit: Payer: BC Managed Care – PPO | Admitting: Internal Medicine

## 2012-12-12 ENCOUNTER — Other Ambulatory Visit: Payer: Self-pay | Admitting: Internal Medicine

## 2012-12-12 DIAGNOSIS — I10 Essential (primary) hypertension: Secondary | ICD-10-CM

## 2012-12-12 MED ORDER — LISINOPRIL-HYDROCHLOROTHIAZIDE 20-25 MG PO TABS: 1.0000 | ORAL_TABLET | Freq: Every day | ORAL | Status: DC

## 2013-01-31 ENCOUNTER — Other Ambulatory Visit (INDEPENDENT_AMBULATORY_CARE_PROVIDER_SITE_OTHER): Payer: BC Managed Care – PPO

## 2013-01-31 ENCOUNTER — Encounter: Payer: Self-pay | Admitting: Internal Medicine

## 2013-01-31 ENCOUNTER — Ambulatory Visit (INDEPENDENT_AMBULATORY_CARE_PROVIDER_SITE_OTHER): Payer: BC Managed Care – PPO | Admitting: Internal Medicine

## 2013-01-31 VITALS — BP 132/74 | HR 67 | Temp 98.5°F | Resp 16 | Ht 65.0 in | Wt 134.0 lb

## 2013-01-31 DIAGNOSIS — R404 Transient alteration of awareness: Secondary | ICD-10-CM

## 2013-01-31 DIAGNOSIS — F419 Anxiety disorder, unspecified: Secondary | ICD-10-CM

## 2013-01-31 DIAGNOSIS — R400 Somnolence: Secondary | ICD-10-CM

## 2013-01-31 DIAGNOSIS — Z23 Encounter for immunization: Secondary | ICD-10-CM

## 2013-01-31 DIAGNOSIS — E781 Pure hyperglyceridemia: Secondary | ICD-10-CM | POA: Insufficient documentation

## 2013-01-31 DIAGNOSIS — F411 Generalized anxiety disorder: Secondary | ICD-10-CM

## 2013-01-31 DIAGNOSIS — I10 Essential (primary) hypertension: Secondary | ICD-10-CM

## 2013-01-31 DIAGNOSIS — IMO0002 Reserved for concepts with insufficient information to code with codable children: Secondary | ICD-10-CM

## 2013-01-31 DIAGNOSIS — G4750 Parasomnia, unspecified: Secondary | ICD-10-CM

## 2013-01-31 DIAGNOSIS — E7800 Pure hypercholesterolemia, unspecified: Secondary | ICD-10-CM

## 2013-01-31 LAB — LIPID PANEL: Cholesterol: 261 mg/dL — ABNORMAL HIGH (ref 0–200)

## 2013-01-31 LAB — BASIC METABOLIC PANEL
BUN: 21 mg/dL (ref 6–23)
Calcium: 9.4 mg/dL (ref 8.4–10.5)
Creatinine, Ser: 0.9 mg/dL (ref 0.4–1.2)
GFR: 67.65 mL/min (ref >60.00)

## 2013-01-31 MED ORDER — MODAFINIL 100 MG PO TABS: 100.0000 mg | ORAL_TABLET | Freq: Every day | ORAL | Status: DC

## 2013-01-31 MED ORDER — LISINOPRIL-HYDROCHLOROTHIAZIDE 20-25 MG PO TABS: 1.0000 | ORAL_TABLET | Freq: Every day | ORAL | Status: DC

## 2013-01-31 NOTE — Patient Instructions (Signed)
Hypertriglyceridemia  Diet for High blood levels of Triglycerides Most fats in food are triglycerides. Triglycerides in your blood are stored as fat in your body. High levels of triglycerides in your blood may put you at a greater risk for heart disease and stroke.  Normal triglyceride levels are less than 150 mg/dL. Borderline high levels are 150-199 mg/dl. High levels are 200 - 499 mg/dL, and very high triglyceride levels are greater than 500 mg/dL. The decision to treat high triglycerides is generally based on the level. For people with borderline or high triglyceride levels, treatment includes weight loss and exercise. Drugs are recommended for people with very high triglyceride levels. Many people who need treatment for high triglyceride levels have metabolic syndrome. This syndrome is a collection of disorders that often include: insulin resistance, high blood pressure, blood clotting problems, high cholesterol and triglycerides. TESTING PROCEDURE FOR TRIGLYCERIDES  You should not eat 4 hours before getting your triglycerides measured. The normal range of triglycerides is between 10 and 250 milligrams per deciliter (mg/dl). Some people may have extreme levels (1000 or above), but your triglyceride level may be too high if it is above 150 mg/dl, depending on what other risk factors you have for heart disease.  People with high blood triglycerides may also have high blood cholesterol levels. If you have high blood cholesterol as well as high blood triglycerides, your risk for heart disease is probably greater than if you only had high triglycerides. High blood cholesterol is one of the main risk factors for heart disease. CHANGING YOUR DIET  Your weight can affect your blood triglyceride level. If you are more than 20% above your ideal body weight, you may be able to lower your blood triglycerides by losing weight. Eating less and exercising regularly is the best way to combat this. Fat provides more  calories than any other food. The best way to lose weight is to eat less fat. Only 30% of your total calories should come from fat. Less than 7% of your diet should come from saturated fat. A diet low in fat and saturated fat is the same as a diet to decrease blood cholesterol. By eating a diet lower in fat, you may lose weight, lower your blood cholesterol, and lower your blood triglyceride level.  Eating a diet low in fat, especially saturated fat, may also help you lower your blood triglyceride level. Ask your dietitian to help you figure how much fat you can eat based on the number of calories your caregiver has prescribed for you.  Exercise, in addition to helping with weight loss may also help lower triglyceride levels.   Alcohol can increase blood triglycerides. You may need to stop drinking alcoholic beverages.  Too much carbohydrate in your diet may also increase your blood triglycerides. Some complex carbohydrates are necessary in your diet. These may include bread, rice, potatoes, other starchy vegetables and cereals.  Reduce "simple" carbohydrates. These may include pure sugars, candy, honey, and jelly without losing other nutrients. If you have the kind of high blood triglycerides that is affected by the amount of carbohydrates in your diet, you will need to eat less sugar and less high-sugar foods. Your caregiver can help you with this.  Adding 2-4 grams of fish oil (EPA+ DHA) may also help lower triglycerides. Speak with your caregiver before adding any supplements to your regimen. Following the Diet  Maintain your ideal weight. Your caregivers can help you with a diet. Generally, eating less food and getting more   exercise will help you lose weight. Joining a weight control group may also help. Ask your caregivers for a good weight control group in your area.  Eat low-fat foods instead of high-fat foods. This can help you lose weight too.  These foods are lower in fat. Eat MORE of these:    Dried beans, peas, and lentils.  Egg whites.  Low-fat cottage cheese.  Fish.  Lean cuts of meat, such as round, sirloin, rump, and flank (cut extra fat off meat you fix).  Whole grain breads, cereals and pasta.  Skim and nonfat dry milk.  Low-fat yogurt.  Poultry without the skin.  Cheese made with skim or part-skim milk, such as mozzarella, parmesan, farmers', ricotta, or pot cheese. These are higher fat foods. Eat LESS of these:   Whole milk and foods made from whole milk, such as American, blue, cheddar, monterey jack, and swiss cheese  High-fat meats, such as luncheon meats, sausages, knockwurst, bratwurst, hot dogs, ribs, corned beef, ground pork, and regular ground beef.  Fried foods. Limit saturated fats in your diet. Substituting unsaturated fat for saturated fat may decrease your blood triglyceride level. You will need to read package labels to know which products contain saturated fats.  These foods are high in saturated fat. Eat LESS of these:   Fried pork skins.  Whole milk.  Skin and fat from poultry.  Palm oil.  Butter.  Shortening.  Cream cheese.  Bacon.  Margarines and baked goods made from listed oils.  Vegetable shortenings.  Chitterlings.  Fat from meats.  Coconut oil.  Palm kernel oil.  Lard.  Cream.  Sour cream.  Fatback.  Coffee whiteners and non-dairy creamers made with these oils.  Cheese made from whole milk. Use unsaturated fats (both polyunsaturated and monounsaturated) moderately. Remember, even though unsaturated fats are better than saturated fats; you still want a diet low in total fat.  These foods are high in unsaturated fat:   Canola oil.  Sunflower oil.  Mayonnaise.  Almonds.  Peanuts.  Pine nuts.  Margarines made with these oils.  Safflower oil.  Olive oil.  Avocados.  Cashews.  Peanut butter.  Sunflower seeds.  Soybean oil.  Peanut  oil.  Olives.  Pecans.  Walnuts.  Pumpkin seeds. Avoid sugar and other high-sugar foods. This will decrease carbohydrates without decreasing other nutrients. Sugar in your food goes rapidly to your blood. When there is excess sugar in your blood, your liver may use it to make more triglycerides. Sugar also contains calories without other important nutrients.  Eat LESS of these:   Sugar, brown sugar, powdered sugar, jam, jelly, preserves, honey, syrup, molasses, pies, candy, cakes, cookies, frosting, pastries, colas, soft drinks, punches, fruit drinks, and regular gelatin.  Avoid alcohol. Alcohol, even more than sugar, may increase blood triglycerides. In addition, alcohol is high in calories and low in nutrients. Ask for sparkling water, or a diet soft drink instead of an alcoholic beverage. Suggestions for planning and preparing meals   Bake, broil, grill or roast meats instead of frying.  Remove fat from meats and skin from poultry before cooking.  Add spices, herbs, lemon juice or vinegar to vegetables instead of salt, rich sauces or gravies.  Use a non-stick skillet without fat or use no-stick sprays.  Cool and refrigerate stews and broth. Then remove the hardened fat floating on the surface before serving.  Refrigerate meat drippings and skim off fat to make low-fat gravies.  Serve more fish.  Use less butter,   margarine and other high-fat spreads on bread or vegetables.  Use skim or reconstituted non-fat dry milk for cooking.  Cook with low-fat cheeses.  Substitute low-fat yogurt or cottage cheese for all or part of the sour cream in recipes for sauces, dips or congealed salads.  Use half yogurt/half mayonnaise in salad recipes.  Substitute evaporated skim milk for cream. Evaporated skim milk or reconstituted non-fat dry milk can be whipped and substituted for whipped cream in certain recipes.  Choose fresh fruits for dessert instead of high-fat foods such as pies or  cakes. Fruits are naturally low in fat. When Dining Out   Order low-fat appetizers such as fruit or vegetable juice, pasta with vegetables or tomato sauce.  Select clear, rather than cream soups.  Ask that dressings and gravies be served on the side. Then use less of them.  Order foods that are baked, broiled, poached, steamed, stir-fried, or roasted.  Ask for margarine instead of butter, and use only a small amount.  Drink sparkling water, unsweetened tea or coffee, or diet soft drinks instead of alcohol or other sweet beverages. QUESTIONS AND ANSWERS ABOUT OTHER FATS IN THE BLOOD: SATURATED FAT, TRANS FAT, AND CHOLESTEROL What is trans fat? Trans fat is a type of fat that is formed when vegetable oil is hardened through a process called hydrogenation. This process helps makes foods more solid, gives them shape, and prolongs their shelf life. Trans fats are also called hydrogenated or partially hydrogenated oils.  What do saturated fat, trans fat, and cholesterol in foods have to do with heart disease? Saturated fat, trans fat, and cholesterol in the diet all raise the level of LDL "bad" cholesterol in the blood. The higher the LDL cholesterol, the greater the risk for coronary heart disease (CHD). Saturated fat and trans fat raise LDL similarly.  What foods contain saturated fat, trans fat, and cholesterol? High amounts of saturated fat are found in animal products, such as fatty cuts of meat, chicken skin, and full-fat dairy products like butter, whole milk, cream, and cheese, and in tropical vegetable oils such as palm, palm kernel, and coconut oil. Trans fat is found in some of the same foods as saturated fat, such as vegetable shortening, some margarines (especially hard or stick margarine), crackers, cookies, baked goods, fried foods, salad dressings, and other processed foods made with partially hydrogenated vegetable oils. Small amounts of trans fat also occur naturally in some animal  products, such as milk products, beef, and lamb. Foods high in cholesterol include liver, other organ meats, egg yolks, shrimp, and full-fat dairy products. How can I use the new food label to make heart-healthy food choices? Check the Nutrition Facts panel of the food label. Choose foods lower in saturated fat, trans fat, and cholesterol. For saturated fat and cholesterol, you can also use the Percent Daily Value (%DV): 5% DV or less is low, and 20% DV or more is high. (There is no %DV for trans fat.) Use the Nutrition Facts panel to choose foods low in saturated fat and cholesterol, and if the trans fat is not listed, read the ingredients and limit products that list shortening or hydrogenated or partially hydrogenated vegetable oil, which tend to be high in trans fat. POINTS TO REMEMBER:   Discuss your risk for heart disease with your caregivers, and take steps to reduce risk factors.  Change your diet. Choose foods that are low in saturated fat, trans fat, and cholesterol.  Add exercise to your daily routine if   it is not already being done. Participate in physical activity of moderate intensity, like brisk walking, for at least 30 minutes on most, and preferably all days of the week. No time? Break the 30 minutes into three, 10-minute segments during the day.  Stop smoking. If you do smoke, contact your caregiver to discuss ways in which they can help you quit.  Do not use street drugs.  Maintain a normal weight.  Maintain a healthy blood pressure.  Keep up with your blood work for checking the fats in your blood as directed by your caregiver. Document Released: 12/03/2003 Document Revised: 08/16/2011 Document Reviewed: 06/30/2008 [employer] Patient Information 2014 [employer].  

## 2013-01-31 NOTE — Progress Notes (Signed)
Subjective:    Patient ID: Mary [name], female    DOB: [birth year], 62 y.o.   MRN: [id number]  Hypertension This is a chronic problem. The current episode started more than 1 year ago. The problem has been gradually improving since onset. The problem is controlled. Pertinent negatives include no anxiety, blurred vision, chest pain, headaches, malaise/fatigue, neck pain, orthopnea, palpitations, peripheral edema, PND, shortness of breath or sweats. Past treatments include ACE inhibitors and diuretics. The current treatment provides moderate improvement. Compliance problems include exercise and diet.       Review of Systems  Constitutional: Positive for fatigue. Negative for fever, chills, malaise/fatigue, diaphoresis, activity change, appetite change and unexpected weight change.       She has severe fatigue and excessive sleepiness during the day  HENT: Negative.   Eyes: Negative.  Negative for blurred vision.  Respiratory: Negative.  Negative for cough, choking, chest tightness, shortness of breath, wheezing and stridor.   Cardiovascular: Negative.  Negative for chest pain, palpitations, orthopnea, leg swelling and PND.  Gastrointestinal: Negative.  Negative for nausea, vomiting, abdominal pain, diarrhea, constipation and blood in stool.  Endocrine: Negative.   Genitourinary: Negative.   Musculoskeletal: Positive for back pain. Negative for arthralgias, gait problem, joint swelling, myalgias, neck pain and neck stiffness.  Skin: Negative.   Allergic/Immunologic: Negative.   Neurological: Negative.  Negative for dizziness, tremors, syncope, weakness, light-headedness, numbness and headaches.  Hematological: Negative.  Negative for adenopathy. Does not bruise/bleed easily.  Psychiatric/Behavioral: Negative for suicidal ideas, hallucinations, behavioral problems, confusion, sleep disturbance, self-injury, dysphoric mood, decreased concentration and agitation. The patient is  nervous/anxious. The patient is not hyperactive.        Objective:   Physical Exam  Vitals reviewed. Constitutional: She is oriented to person, place, and time. She appears well-developed and well-nourished. No distress.  HENT:  Head: Normocephalic and atraumatic.  Mouth/Throat: Oropharynx is clear and moist. No oropharyngeal exudate.  Eyes: Conjunctivae are normal. Right eye exhibits no discharge. Left eye exhibits no discharge. No scleral icterus.  Neck: Normal range of motion. Neck supple. No JVD present. No tracheal deviation present. No thyromegaly present.  Cardiovascular: Normal rate, regular rhythm, normal heart sounds and intact distal pulses.  Exam reveals no gallop and no friction rub.   No murmur heard. Pulmonary/Chest: Effort normal and breath sounds normal. No stridor. No respiratory distress. She has no wheezes. She has no rales. She exhibits no tenderness.  Abdominal: Soft. Bowel sounds are normal. She exhibits no distension and no mass. There is no tenderness. There is no rebound and no guarding.  Musculoskeletal: Normal range of motion. She exhibits no edema and no tenderness.  Lymphadenopathy:    She has no cervical adenopathy.  Neurological: She is oriented to person, place, and time.  Skin: Skin is warm and dry. No rash noted. She is not diaphoretic. No erythema. No pallor.  Psychiatric: She has a normal mood and affect. Her behavior is normal. Judgment and thought content normal.     Lab Results  Component Value Date   WBC 8.6 05/16/2012   HGB 13.8 05/16/2012   HCT 40.7 05/16/2012   PLT 288.0 05/16/2012   GLUCOSE 108* 05/16/2012   CHOL 287* 05/16/2012   TRIG 495.0 Triglyceride is over 400; calculations on Lipids are invalid.* 05/16/2012   HDL 55.20 05/16/2012   LDLDIRECT 133.4 05/16/2012   ALT 16 05/16/2012   AST 23 05/16/2012   NA 138 05/16/2012   K 4.6 05/16/2012   CL 92*  05/16/2012   CREATININE 1.1 05/16/2012   BUN 21 05/16/2012   CO2 34* 05/16/2012   TSH 1.06  05/16/2012   HGBA1C 5.9 05/16/2012       Assessment & Plan:

## 2013-01-31 NOTE — Progress Notes (Signed)
Pre visit review using our clinic review tool, if applicable. No additional management support is needed unless otherwise documented below in the visit note. 

## 2013-02-01 DIAGNOSIS — Z23 Encounter for immunization: Secondary | ICD-10-CM

## 2013-02-03 ENCOUNTER — Encounter: Payer: Self-pay | Admitting: Internal Medicine

## 2013-02-03 NOTE — Assessment & Plan Note (Signed)
Improvement noted 

## 2013-02-03 NOTE — Assessment & Plan Note (Signed)
She will restart provigil

## 2013-02-03 NOTE — Assessment & Plan Note (Signed)
Her BP is well controlled Lytes and renal function are normal 

## 2013-02-03 NOTE — Assessment & Plan Note (Signed)
Again, she will not take a statin 

## 2013-02-03 NOTE — Assessment & Plan Note (Signed)
She will continue klonopin as needed

## 2013-03-21 ENCOUNTER — Encounter: Payer: Self-pay | Admitting: Internal Medicine

## 2013-03-21 ENCOUNTER — Ambulatory Visit (INDEPENDENT_AMBULATORY_CARE_PROVIDER_SITE_OTHER): Payer: BC Managed Care – PPO | Admitting: Internal Medicine

## 2013-03-21 VITALS — BP 128/80 | HR 71 | Temp 97.9°F | Resp 16 | Ht 65.0 in | Wt 135.0 lb

## 2013-03-21 DIAGNOSIS — I10 Essential (primary) hypertension: Secondary | ICD-10-CM

## 2013-03-21 DIAGNOSIS — M549 Dorsalgia, unspecified: Secondary | ICD-10-CM

## 2013-03-21 DIAGNOSIS — IMO0002 Reserved for concepts with insufficient information to code with codable children: Secondary | ICD-10-CM

## 2013-03-21 DIAGNOSIS — M3500 Sicca syndrome, unspecified: Secondary | ICD-10-CM

## 2013-03-21 DIAGNOSIS — G8929 Other chronic pain: Secondary | ICD-10-CM

## 2013-03-21 DIAGNOSIS — R404 Transient alteration of awareness: Secondary | ICD-10-CM

## 2013-03-21 DIAGNOSIS — G4750 Parasomnia, unspecified: Secondary | ICD-10-CM

## 2013-03-21 DIAGNOSIS — R400 Somnolence: Secondary | ICD-10-CM

## 2013-03-21 MED ORDER — AMLODIPINE-OLMESARTAN 10-40 MG PO TABS: 1.0000 | ORAL_TABLET | Freq: Every day | ORAL | Status: DC

## 2013-03-21 MED ORDER — MODAFINIL 100 MG PO TABS: 100.0000 mg | ORAL_TABLET | Freq: Every day | ORAL | Status: DC

## 2013-03-21 NOTE — Assessment & Plan Note (Signed)
I think she is having side effects from the ACEI-HCTZ combo So I have asked her to change to azor for BP control

## 2013-03-21 NOTE — Assessment & Plan Note (Signed)
She tells me that she needs a new pain specialist

## 2013-03-21 NOTE — Progress Notes (Signed)
Pre visit review using our clinic review tool, if applicable. No additional management support is needed unless otherwise documented below in the visit note. 

## 2013-03-21 NOTE — Assessment & Plan Note (Signed)
I have asked her to see rheum about this

## 2013-03-21 NOTE — Patient Instructions (Signed)

## 2013-03-21 NOTE — Progress Notes (Signed)
Subjective:    Patient ID: Mary [name], female    DOB: 12/11/1950, 63 y.o.   MRN: [id number]  Hypertension This is a chronic problem. The current episode started more than 1 year ago. The problem has been gradually improving since onset. The problem is controlled. Associated symptoms include anxiety. Pertinent negatives include no blurred vision, chest pain, headaches, malaise/fatigue, neck pain, orthopnea, palpitations, peripheral edema, PND, shortness of breath or sweats. Agents associated with hypertension include amphetamines. Past treatments include ACE inhibitors and diuretics. The current treatment provides significant improvement. Compliance problems include medication side effects, exercise and diet (dry mouth and throat, intermittent episodes of neck and face swelling).       Review of Systems  Constitutional: Negative.  Negative for fever, chills, malaise/fatigue, diaphoresis, activity change, appetite change, fatigue and unexpected weight change.  HENT: Negative.  Negative for facial swelling, sinus pressure, sore throat, trouble swallowing and voice change.        Dry mouth  Eyes: Negative.  Negative for blurred vision.  Respiratory: Negative.  Negative for cough, choking, chest tightness, shortness of breath, wheezing and stridor.   Cardiovascular: Negative.  Negative for chest pain, palpitations, orthopnea, leg swelling and PND.  Gastrointestinal: Negative.  Negative for nausea, abdominal pain, diarrhea, constipation and blood in stool.  Endocrine: Negative.   Genitourinary: Negative.   Musculoskeletal: Positive for back pain. Negative for arthralgias, gait problem, joint swelling, myalgias, neck pain and neck stiffness.  Skin: Negative.   Allergic/Immunologic: Negative.   Neurological: Negative.  Negative for headaches.  Hematological: Negative.  Negative for adenopathy. Does not bruise/bleed easily.  Psychiatric/Behavioral: Negative.        Objective:   Physical  Exam  Vitals reviewed. Constitutional: She is oriented to person, place, and time. She appears well-developed and well-nourished.  Non-toxic appearance. She does not have a sickly appearance. She does not appear ill. No distress.  HENT:  Head: Normocephalic and atraumatic.  Mouth/Throat: Oropharynx is clear and moist. Mucous membranes are not pale, dry and not cyanotic. No oral lesions. No trismus in the jaw. No uvula swelling. No oropharyngeal exudate, posterior oropharyngeal edema, posterior oropharyngeal erythema or tonsillar abscesses.  Eyes: Conjunctivae are normal. Right eye exhibits no discharge. Left eye exhibits no discharge. No scleral icterus.  Neck: Normal range of motion. Neck supple. No JVD present. No tracheal deviation present. No thyromegaly present.  Cardiovascular: Normal rate, regular rhythm, normal heart sounds and intact distal pulses.  Exam reveals no gallop and no friction rub.   No murmur heard. Pulmonary/Chest: Effort normal and breath sounds normal. No stridor. No respiratory distress. She has no wheezes. She has no rales. She exhibits no tenderness.  Abdominal: Soft. Bowel sounds are normal. She exhibits no distension and no mass. There is no tenderness. There is no rebound and no guarding.  Musculoskeletal: Normal range of motion. She exhibits no edema and no tenderness.  Lymphadenopathy:    She has no cervical adenopathy.  Neurological: She is oriented to person, place, and time.  Skin: Skin is warm and dry. No rash noted. She is not diaphoretic. No erythema. No pallor.  Psychiatric: She has a normal mood and affect. Her behavior is normal. Judgment and thought content normal.     Lab Results  Component Value Date   WBC 8.6 05/16/2012   HGB 13.8 05/16/2012   HCT 40.7 05/16/2012   PLT 288.0 05/16/2012   GLUCOSE 99 01/31/2013   CHOL 261* 01/31/2013   TRIG 189.0* 01/31/2013   HDL  59.20 01/31/2013   LDLDIRECT 167.1 01/31/2013   ALT 16 05/16/2012   AST 23 05/16/2012    NA 137 01/31/2013   K 4.0 01/31/2013   CL 99 01/31/2013   CREATININE 0.9 01/31/2013   BUN 21 01/31/2013   CO2 28 01/31/2013   TSH 1.06 05/16/2012   HGBA1C 5.9 05/16/2012       Assessment & Plan:

## 2013-03-21 NOTE — Assessment & Plan Note (Signed)
Will continue provigil

## 2013-05-08 ENCOUNTER — Other Ambulatory Visit: Payer: Self-pay | Admitting: Internal Medicine

## 2013-05-23 ENCOUNTER — Ambulatory Visit: Payer: BC Managed Care – PPO | Admitting: Internal Medicine

## 2013-06-03 ENCOUNTER — Telehealth: Payer: Self-pay | Admitting: *Deleted

## 2013-06-03 NOTE — Telephone Encounter (Signed)
Spoke with pt advised of MDs message 

## 2013-06-03 NOTE — Telephone Encounter (Signed)
Pt called states since taking Azor she has had excessive weight gain.  Please advise

## 2013-06-03 NOTE — Telephone Encounter (Signed)
That does not make sense to me

## 2013-06-17 ENCOUNTER — Encounter: Payer: Self-pay | Admitting: Internal Medicine

## 2013-06-17 ENCOUNTER — Ambulatory Visit (INDEPENDENT_AMBULATORY_CARE_PROVIDER_SITE_OTHER): Payer: BC Managed Care – PPO | Admitting: Internal Medicine

## 2013-06-17 ENCOUNTER — Ambulatory Visit (INDEPENDENT_AMBULATORY_CARE_PROVIDER_SITE_OTHER): Payer: BC Managed Care – PPO

## 2013-06-17 VITALS — BP 180/86 | HR 58 | Temp 98.5°F | Resp 16 | Ht 65.0 in | Wt 140.0 lb

## 2013-06-17 DIAGNOSIS — F419 Anxiety disorder, unspecified: Secondary | ICD-10-CM

## 2013-06-17 DIAGNOSIS — G4750 Parasomnia, unspecified: Secondary | ICD-10-CM

## 2013-06-17 DIAGNOSIS — IMO0002 Reserved for concepts with insufficient information to code with codable children: Secondary | ICD-10-CM

## 2013-06-17 DIAGNOSIS — I10 Essential (primary) hypertension: Secondary | ICD-10-CM

## 2013-06-17 DIAGNOSIS — F411 Generalized anxiety disorder: Secondary | ICD-10-CM

## 2013-06-17 LAB — URINALYSIS, ROUTINE W REFLEX MICROSCOPIC
Bilirubin Urine: NEGATIVE
Ketones, ur: NEGATIVE
Leukocytes, UA: NEGATIVE
NITRITE: NEGATIVE
PH: 5.5 (ref 5.0–8.0)
Specific Gravity, Urine: <=1.005 — AB (ref 1.000–1.030)
TOTAL PROTEIN, URINE-UPE24: NEGATIVE
Urine Glucose: NEGATIVE
Urobilinogen, UA: 0.2 (ref 0.0–1.0)

## 2013-06-17 LAB — BASIC METABOLIC PANEL
BUN: 30 mg/dL — ABNORMAL HIGH (ref 6–23)
CO2: 29 mEq/L (ref 19–32)
Calcium: 10 mg/dL (ref 8.4–10.5)
Chloride: 101 mEq/L (ref 96–112)
Creatinine, Ser: 1 mg/dL (ref 0.4–1.2)
GFR: 61.24 mL/min (ref >60.00)
Glucose, Bld: 119 mg/dL — ABNORMAL HIGH (ref 70–99)
POTASSIUM: 4.9 meq/L (ref 3.5–5.1)
Sodium: 139 mEq/L (ref 135–145)

## 2013-06-17 LAB — CBC WITH DIFFERENTIAL/PLATELET
Basophils Absolute: 0 10*3/uL (ref 0.0–0.1)
Basophils Relative: 0.7 % (ref 0.0–3.0)
EOS PCT: 2 % (ref 0.0–5.0)
Eosinophils Absolute: 0.1 10*3/uL (ref 0.0–0.7)
HEMATOCRIT: 37.9 % (ref 36.0–46.0)
Hemoglobin: 12.7 g/dL (ref 12.0–15.0)
LYMPHS ABS: 2.2 10*3/uL (ref 0.7–4.0)
Lymphocytes Relative: 30.5 % (ref 12.0–46.0)
MCHC: 33.6 g/dL (ref 30.0–36.0)
MCV: 87.1 fl (ref 78.0–100.0)
MONOS PCT: 5.4 % (ref 3.0–12.0)
Monocytes Absolute: 0.4 10*3/uL (ref 0.1–1.0)
Neutro Abs: 4.4 10*3/uL (ref 1.4–7.7)
Neutrophils Relative %: 61.4 % (ref 43.0–77.0)
Platelets: 205 10*3/uL (ref 150.0–400.0)
RBC: 4.35 Mil/uL (ref 3.87–5.11)
RDW: 13.4 % (ref 11.5–14.6)
WBC: 7.2 10*3/uL (ref 4.5–10.5)

## 2013-06-17 LAB — T3, FREE: T3, Free: 3 pg/mL (ref 2.3–4.2)

## 2013-06-17 LAB — TSH: TSH: 0.58 u[IU]/mL (ref 0.35–5.50)

## 2013-06-17 MED ORDER — NEBIVOLOL HCL 10 MG PO TABS: 10.0000 mg | ORAL_TABLET | Freq: Every day | ORAL | Status: DC

## 2013-06-17 MED ORDER — LISINOPRIL 20 MG PO TABS: 20.0000 mg | ORAL_TABLET | Freq: Every day | ORAL | Status: DC

## 2013-06-17 MED ORDER — CLONAZEPAM 2 MG PO TABS: 2.0000 mg | ORAL_TABLET | Freq: Two times a day (BID) | ORAL | Status: DC | PRN

## 2013-06-17 MED ORDER — FUROSEMIDE 20 MG PO TABS: 20.0000 mg | ORAL_TABLET | Freq: Two times a day (BID) | ORAL | Status: DC

## 2013-06-17 NOTE — Progress Notes (Signed)
Subjective:    Patient ID: Mary [name], female    DOB: 12-25-1950, 63 y.o.   MRN: [id number]  Hypertension This is a chronic problem. The current episode started more than 1 year ago. The problem has been gradually worsening since onset. The problem is uncontrolled. Associated symptoms include anxiety. Pertinent negatives include no blurred vision, chest pain, headaches, malaise/fatigue, neck pain, orthopnea, palpitations, peripheral edema, PND, shortness of breath or sweats. Agents associated with hypertension include amphetamines. Past treatments include calcium channel blockers and angiotensin blockers. The current treatment provides moderate improvement. Compliance problems include psychosocial issues, exercise and diet.  Hypertensive end-organ damage includes a thyroid problem.      Review of Systems  Constitutional: Positive for fatigue. Negative for fever, chills, malaise/fatigue, diaphoresis, appetite change and unexpected weight change.  HENT: Negative.   Eyes: Negative.  Negative for blurred vision.  Respiratory: Negative.  Negative for cough, choking, chest tightness, shortness of breath and stridor.   Cardiovascular: Negative.  Negative for chest pain, palpitations, orthopnea, leg swelling and PND.  Gastrointestinal: Negative.  Negative for nausea, vomiting, abdominal pain, diarrhea, constipation and blood in stool.  Endocrine: Negative.   Genitourinary: Negative.   Musculoskeletal: Positive for arthralgias and back pain. Negative for gait problem, joint swelling, myalgias, neck pain and neck stiffness.  Skin: Negative.   Allergic/Immunologic: Negative.   Neurological: Negative.  Negative for dizziness, tremors, weakness, light-headedness, numbness and headaches.  Hematological: Negative.  Negative for adenopathy. Does not bruise/bleed easily.  Psychiatric/Behavioral: Positive for sleep disturbance. Negative for suicidal ideas, hallucinations, behavioral problems,  confusion, self-injury, dysphoric mood, decreased concentration and agitation. The patient is nervous/anxious. The patient is not hyperactive.        Objective:   Physical Exam  Vitals reviewed. Constitutional: She is oriented to person, place, and time. She appears well-developed and well-nourished. No distress.  HENT:  Head: Normocephalic and atraumatic.  Mouth/Throat: Oropharynx is clear and moist. No oropharyngeal exudate.  Eyes: Conjunctivae and EOM are normal. Pupils are equal, round, and reactive to light. Right eye exhibits no discharge. Left eye exhibits no discharge. No scleral icterus.  Neck: Normal range of motion. Neck supple. No JVD present. No tracheal deviation present. No thyromegaly present.  Cardiovascular: Normal rate, regular rhythm, normal heart sounds and intact distal pulses.  Exam reveals no gallop and no friction rub.   No murmur heard. Pulmonary/Chest: Effort normal and breath sounds normal. No stridor. No respiratory distress. She has no wheezes. She has no rales. She exhibits no tenderness.  Abdominal: Soft. Bowel sounds are normal. She exhibits no distension and no mass. There is no tenderness. There is no rebound and no guarding.  Musculoskeletal: Normal range of motion. She exhibits no edema and no tenderness.  Lymphadenopathy:    She has no cervical adenopathy.  Neurological: She is oriented to person, place, and time.  Skin: Skin is warm and dry. No rash noted. She is not diaphoretic. No erythema. No pallor.  Psychiatric: Her behavior is normal. Judgment and thought content normal. Her mood appears anxious. Her affect is not angry, not blunt, not labile and not inappropriate. Her speech is not rapid and/or pressured, not delayed, not tangential and not slurred. Cognition and memory are normal. Cognition and memory are not impaired. She exhibits a depressed mood (tearful). She expresses no homicidal and no suicidal ideation. She expresses no suicidal plans and  no homicidal plans. She is communicative.     Lab Results  Component Value Date   WBC 8.6  05/16/2012   HGB 13.8 05/16/2012   HCT 40.7 05/16/2012   PLT 288.0 05/16/2012   GLUCOSE 99 01/31/2013   CHOL 261* 01/31/2013   TRIG 189.0* 01/31/2013   HDL 59.20 01/31/2013   LDLDIRECT 167.1 01/31/2013   ALT 16 05/16/2012   AST 23 05/16/2012   NA 137 01/31/2013   K 4.0 01/31/2013   CL 99 01/31/2013   CREATININE 0.9 01/31/2013   BUN 21 01/31/2013   CO2 28 01/31/2013   TSH 1.06 05/16/2012   HGBA1C 5.9 05/16/2012       Assessment & Plan:

## 2013-06-17 NOTE — Progress Notes (Signed)
Pre visit review using our clinic review tool, if applicable. No additional management support is needed unless otherwise documented below in the visit note. 

## 2013-06-17 NOTE — Patient Instructions (Signed)

## 2013-06-18 ENCOUNTER — Telehealth: Payer: Self-pay

## 2013-06-18 ENCOUNTER — Telehealth: Payer: Self-pay | Admitting: Internal Medicine

## 2013-06-18 ENCOUNTER — Encounter: Payer: Self-pay | Admitting: Internal Medicine

## 2013-06-18 DIAGNOSIS — I10 Essential (primary) hypertension: Secondary | ICD-10-CM

## 2013-06-18 LAB — DRUGS OF ABUSE SCREEN W/O ALC, ROUTINE URINE
Amphetamine Screen, Ur: NEGATIVE
Barbiturate Quant, Ur: POSITIVE — AB
Benzodiazepines.: NEGATIVE
CREATININE, U: 29.3 mg/dL
Cocaine Metabolites: NEGATIVE
METHADONE: NEGATIVE
Marijuana Metabolite: NEGATIVE
Opiate Screen, Urine: POSITIVE — AB
PROPOXYPHENE: NEGATIVE
Phencyclidine (PCP): NEGATIVE

## 2013-06-18 LAB — T4: T4, Total: 6.5 ug/dL (ref 5.0–12.5)

## 2013-06-18 MED ORDER — LISINOPRIL 20 MG PO TABS: 20.0000 mg | ORAL_TABLET | Freq: Every day | ORAL | Status: DC

## 2013-06-18 NOTE — Telephone Encounter (Signed)
1 qd 

## 2013-06-18 NOTE — Assessment & Plan Note (Signed)
She tells me that klonopin helps with this

## 2013-06-18 NOTE — Assessment & Plan Note (Addendum)
Her BP is not well controlled and she has not been taking Azor She will have to stop taking provigil due to the BP elevation She tells me that HCTZ "does not work for her" Will try to control her BP with an ACEI, lasix, and bystolic I will check her labs and UDS today to look for secondary causes of HTN and to look for end organ damage

## 2013-06-18 NOTE — Telephone Encounter (Signed)
Relevant patient education assigned to patient using Emmi. ° °

## 2013-06-18 NOTE — Assessment & Plan Note (Signed)
She requests a refill for klonopin She will not take an antidepressant like an SSRI

## 2013-06-18 NOTE — Telephone Encounter (Signed)
Received fax from pharmacy requesting clarification on lisinopril. There are two sets of directions 1QD and 1 1/2 QD, please advise which is correct.

## 2013-06-19 LAB — OPIATES/OPIOIDS (LC/MS-MS)
Codeine Urine: NEGATIVE ng/mL
HEROIN (6-AM), UR: NEGATIVE ng/mL
Hydrocodone: NEGATIVE ng/mL
Hydromorphone: NEGATIVE ng/mL
Morphine Urine: 4147 ng/mL
NOROXYCODONE, UR: 957 ng/mL
Norhydrocodone, Ur: NEGATIVE ng/mL
OXYCODONE, UR: 170 ng/mL
Oxymorphone: 111 ng/mL

## 2013-06-19 LAB — BARBITURATES (GC/LC/MS), URINE
AMOBARBITAL GC/MS, URINE: NEGATIVE ng/mL
Butalbital: 684 ng/mL
Pentabarbital: NEGATIVE ng/mL
Phenobarbital: NEGATIVE ng/mL
SECOBARBITAL GC/MS, URINE: NEGATIVE ng/mL

## 2013-07-08 ENCOUNTER — Other Ambulatory Visit (INDEPENDENT_AMBULATORY_CARE_PROVIDER_SITE_OTHER): Payer: BC Managed Care – PPO

## 2013-07-08 ENCOUNTER — Ambulatory Visit (INDEPENDENT_AMBULATORY_CARE_PROVIDER_SITE_OTHER): Payer: BC Managed Care – PPO | Admitting: Internal Medicine

## 2013-07-08 ENCOUNTER — Encounter: Payer: Self-pay | Admitting: Internal Medicine

## 2013-07-08 VITALS — BP 136/82 | HR 47 | Temp 97.9°F | Resp 16 | Ht 65.0 in | Wt 137.0 lb

## 2013-07-08 DIAGNOSIS — I10 Essential (primary) hypertension: Secondary | ICD-10-CM

## 2013-07-08 DIAGNOSIS — R300 Dysuria: Secondary | ICD-10-CM

## 2013-07-08 DIAGNOSIS — N390 Urinary tract infection, site not specified: Secondary | ICD-10-CM | POA: Insufficient documentation

## 2013-07-08 DIAGNOSIS — M899 Disorder of bone, unspecified: Secondary | ICD-10-CM

## 2013-07-08 DIAGNOSIS — E7800 Pure hypercholesterolemia, unspecified: Secondary | ICD-10-CM

## 2013-07-08 DIAGNOSIS — M949 Disorder of cartilage, unspecified: Secondary | ICD-10-CM

## 2013-07-08 DIAGNOSIS — M8580 Other specified disorders of bone density and structure, unspecified site: Secondary | ICD-10-CM

## 2013-07-08 DIAGNOSIS — B952 Enterococcus as the cause of diseases classified elsewhere: Secondary | ICD-10-CM | POA: Insufficient documentation

## 2013-07-08 LAB — URINALYSIS, ROUTINE W REFLEX MICROSCOPIC
Bilirubin Urine: NEGATIVE
Hgb urine dipstick: NEGATIVE
KETONES UR: NEGATIVE
LEUKOCYTES UA: NEGATIVE
Nitrite: NEGATIVE
PH: 5.5 (ref 5.0–8.0)
SPECIFIC GRAVITY, URINE: 1.01 (ref 1.000–1.030)
Total Protein, Urine: NEGATIVE
URINE GLUCOSE: NEGATIVE
UROBILINOGEN UA: 0.2 (ref 0.0–1.0)

## 2013-07-08 NOTE — Progress Notes (Signed)
Pre visit review using our clinic review tool, if applicable. No additional management support is needed unless otherwise documented below in the visit note. 

## 2013-07-08 NOTE — Patient Instructions (Signed)

## 2013-07-08 NOTE — Progress Notes (Signed)
Subjective:    Patient ID: Mary [name], female    DOB: [birth year], 63 y.o.   MRN: [id number]  Dysuria  This is a new problem. The current episode started 1 to 4 weeks ago. The problem occurs intermittently. The problem has been unchanged. The quality of the pain is described as burning. The pain is at a severity of 0/10. The patient is experiencing no pain. She is sexually active. There is no history of pyelonephritis. Pertinent negatives include no chills, discharge, flank pain, frequency, hematuria, hesitancy, nausea, sweats, urgency or vomiting. She has tried nothing for the symptoms. The treatment provided no relief.      Review of Systems  Constitutional: Negative.  Negative for fever, chills, diaphoresis, appetite change and fatigue.  HENT: Negative.   Eyes: Negative.   Respiratory: Negative.  Negative for cough, choking, chest tightness, shortness of breath and stridor.   Cardiovascular: Negative.  Negative for chest pain, palpitations and leg swelling.  Gastrointestinal: Negative.  Negative for nausea, vomiting, abdominal pain, diarrhea, constipation and abdominal distention.  Endocrine: Negative.   Genitourinary: Positive for dysuria. Negative for hesitancy, urgency, frequency, hematuria, flank pain, decreased urine volume, vaginal bleeding, vaginal discharge, enuresis, difficulty urinating, genital sores, vaginal pain, menstrual problem and dyspareunia.  Musculoskeletal: Negative.   Skin: Negative.   Allergic/Immunologic: Negative.   Neurological: Negative.   Hematological: Negative.  Negative for adenopathy. Does not bruise/bleed easily.  Psychiatric/Behavioral: Negative.        Objective:   Physical Exam  Vitals reviewed. Constitutional: She is oriented to person, place, and time. She appears well-developed and well-nourished. No distress.  HENT:  Head: Normocephalic and atraumatic.  Mouth/Throat: Oropharynx is clear and moist. No oropharyngeal exudate.  Eyes:  Conjunctivae are normal. Right eye exhibits no discharge. Left eye exhibits no discharge. No scleral icterus.  Neck: Normal range of motion. Neck supple. No JVD present. No tracheal deviation present. No thyromegaly present.  Cardiovascular: Normal rate, regular rhythm, normal heart sounds and intact distal pulses.  Exam reveals no gallop and no friction rub.   No murmur heard. Pulmonary/Chest: Effort normal and breath sounds normal. No stridor. No respiratory distress. She has no wheezes. She has no rales. She exhibits no tenderness.  Abdominal: Soft. Bowel sounds are normal. She exhibits no distension and no mass. There is no hepatosplenomegaly, splenomegaly or hepatomegaly. There is no tenderness. There is no rebound, no guarding and no CVA tenderness.  Musculoskeletal: Normal range of motion. She exhibits no edema and no tenderness.  Lymphadenopathy:    She has no cervical adenopathy.  Neurological: She is oriented to person, place, and time.  Skin: Skin is warm and dry. No rash noted. She is not diaphoretic. No erythema. No pallor.  Psychiatric: She has a normal mood and affect. Her behavior is normal. Judgment and thought content normal.      Lab Results  Component Value Date   WBC 7.2 06/17/2013   HGB 12.7 06/17/2013   HCT 37.9 06/17/2013   PLT 205.0 06/17/2013   GLUCOSE 119* 06/17/2013   CHOL 261* 01/31/2013   TRIG 189.0* 01/31/2013   HDL 59.20 01/31/2013   LDLDIRECT 167.1 01/31/2013   ALT 16 05/16/2012   AST 23 05/16/2012   NA 139 06/17/2013   K 4.9 06/17/2013   CL 101 06/17/2013   CREATININE 1.0 06/17/2013   BUN 30* 06/17/2013   CO2 29 06/17/2013   TSH 0.58 06/17/2013   HGBA1C 5.9 05/16/2012      Assessment & Plan:

## 2013-07-09 NOTE — Assessment & Plan Note (Signed)
Her BP is well controlled 

## 2013-07-09 NOTE — Assessment & Plan Note (Signed)
She is due for a DEXA scan 

## 2013-07-09 NOTE — Assessment & Plan Note (Signed)
I will check her UA and urine clx Will treat for UTI if needed

## 2013-07-10 ENCOUNTER — Encounter: Payer: Self-pay | Admitting: Internal Medicine

## 2013-07-11 ENCOUNTER — Other Ambulatory Visit: Payer: Self-pay | Admitting: Internal Medicine

## 2013-07-11 ENCOUNTER — Encounter: Payer: Self-pay | Admitting: Internal Medicine

## 2013-07-11 DIAGNOSIS — N390 Urinary tract infection, site not specified: Secondary | ICD-10-CM

## 2013-07-11 DIAGNOSIS — B952 Enterococcus as the cause of diseases classified elsewhere: Secondary | ICD-10-CM

## 2013-07-11 MED ORDER — AMPICILLIN 250 MG PO CAPS: 250.0000 mg | ORAL_CAPSULE | Freq: Four times a day (QID) | ORAL | Status: DC

## 2013-07-12 LAB — CULTURE, URINE COMPREHENSIVE: Colony Count: 35000

## 2013-07-15 ENCOUNTER — Other Ambulatory Visit: Payer: Self-pay | Admitting: Internal Medicine

## 2013-07-15 ENCOUNTER — Telehealth: Payer: Self-pay | Admitting: Internal Medicine

## 2013-07-15 DIAGNOSIS — B952 Enterococcus as the cause of diseases classified elsewhere: Secondary | ICD-10-CM

## 2013-07-15 DIAGNOSIS — N390 Urinary tract infection, site not specified: Secondary | ICD-10-CM

## 2013-07-15 MED ORDER — FOSFOMYCIN TROMETHAMINE 3 G PO PACK: 3.0000 g | PACK | Freq: Once | ORAL | Status: DC

## 2013-07-15 NOTE — Telephone Encounter (Signed)
Pt has allergy to penicillin. Is there an alternative to the Ampicillin TR 250 mg that was rx?

## 2013-07-15 NOTE — Telephone Encounter (Signed)
Yes, this has been changed

## 2013-07-29 ENCOUNTER — Other Ambulatory Visit: Payer: Self-pay

## 2013-07-29 DIAGNOSIS — I10 Essential (primary) hypertension: Secondary | ICD-10-CM

## 2013-07-29 MED ORDER — LISINOPRIL 20 MG PO TABS: 20.0000 mg | ORAL_TABLET | Freq: Every day | ORAL | Status: DC

## 2013-07-29 MED ORDER — FUROSEMIDE 20 MG PO TABS: 20.0000 mg | ORAL_TABLET | Freq: Two times a day (BID) | ORAL | Status: DC

## 2013-10-06 ENCOUNTER — Other Ambulatory Visit: Payer: Self-pay | Admitting: Internal Medicine

## 2013-10-22 ENCOUNTER — Telehealth: Payer: Self-pay

## 2013-10-22 DIAGNOSIS — G4750 Parasomnia, unspecified: Secondary | ICD-10-CM

## 2013-10-22 DIAGNOSIS — F419 Anxiety disorder, unspecified: Secondary | ICD-10-CM

## 2013-10-22 NOTE — Telephone Encounter (Signed)
Received request for butalb\caff and clonazepam. Please advise for mail order. Thanks

## 2013-10-23 MED ORDER — CLONAZEPAM 2 MG PO TABS: 2.0000 mg | ORAL_TABLET | Freq: Two times a day (BID) | ORAL | Status: DC | PRN

## 2013-10-23 MED ORDER — BUTALBITAL-APAP-CAFFEINE 50-325-40 MG PO TABS: ORAL_TABLET | ORAL | Status: DC

## 2013-10-23 NOTE — Telephone Encounter (Signed)
RX printed and pending signature to be faxed

## 2013-10-23 NOTE — Telephone Encounter (Signed)
RX faxed

## 2013-10-23 NOTE — Telephone Encounter (Signed)
Ok with me 

## 2013-11-13 ENCOUNTER — Ambulatory Visit: Payer: BC Managed Care – PPO | Admitting: Internal Medicine

## 2014-01-01 ENCOUNTER — Other Ambulatory Visit: Payer: Self-pay | Admitting: *Deleted

## 2014-01-01 DIAGNOSIS — G4750 Parasomnia, unspecified: Secondary | ICD-10-CM

## 2014-01-01 DIAGNOSIS — F419 Anxiety disorder, unspecified: Secondary | ICD-10-CM

## 2014-01-01 DIAGNOSIS — I10 Essential (primary) hypertension: Secondary | ICD-10-CM

## 2014-01-01 NOTE — Telephone Encounter (Signed)
Please advise refills? [facility] is [facility] and would the following medications. Lisinopril, Clonazepam and Butalbital-Apap-Caffeine.

## 2014-01-02 MED ORDER — LISINOPRIL 20 MG PO TABS: 20.0000 mg | ORAL_TABLET | Freq: Every day | ORAL | Status: DC

## 2014-01-02 MED ORDER — CLONAZEPAM 2 MG PO TABS: 2.0000 mg | ORAL_TABLET | Freq: Two times a day (BID) | ORAL | Status: DC | PRN

## 2014-01-02 MED ORDER — BUTALBITAL-APAP-CAFFEINE 50-325-40 MG PO TABS: ORAL_TABLET | ORAL | Status: DC

## 2014-01-08 ENCOUNTER — Ambulatory Visit (INDEPENDENT_AMBULATORY_CARE_PROVIDER_SITE_OTHER): Payer: BC Managed Care – PPO | Admitting: Internal Medicine

## 2014-01-08 ENCOUNTER — Other Ambulatory Visit (INDEPENDENT_AMBULATORY_CARE_PROVIDER_SITE_OTHER): Payer: BC Managed Care – PPO

## 2014-01-08 DIAGNOSIS — Z23 Encounter for immunization: Secondary | ICD-10-CM

## 2014-01-08 DIAGNOSIS — I10 Essential (primary) hypertension: Secondary | ICD-10-CM

## 2014-01-08 DIAGNOSIS — E7800 Pure hypercholesterolemia, unspecified: Secondary | ICD-10-CM

## 2014-01-08 DIAGNOSIS — R739 Hyperglycemia, unspecified: Secondary | ICD-10-CM

## 2014-01-08 DIAGNOSIS — G47419 Narcolepsy without cataplexy: Secondary | ICD-10-CM

## 2014-01-08 LAB — CBC WITH DIFFERENTIAL/PLATELET
BASOS PCT: 0.5 % (ref 0.0–3.0)
Basophils Absolute: 0 10*3/uL (ref 0.0–0.1)
EOS ABS: 0.2 10*3/uL (ref 0.0–0.7)
Eosinophils Relative: 1.9 % (ref 0.0–5.0)
HCT: 38.6 % (ref 36.0–46.0)
HEMOGLOBIN: 12.9 g/dL (ref 12.0–15.0)
Lymphocytes Relative: 31.9 % (ref 12.0–46.0)
Lymphs Abs: 2.6 10*3/uL (ref 0.7–4.0)
MCHC: 33.4 g/dL (ref 30.0–36.0)
MCV: 88.5 fl (ref 78.0–100.0)
MONO ABS: 0.4 10*3/uL (ref 0.1–1.0)
Monocytes Relative: 5.2 % (ref 3.0–12.0)
Neutro Abs: 4.9 10*3/uL (ref 1.4–7.7)
Neutrophils Relative %: 60.5 % (ref 43.0–77.0)
Platelets: 266 10*3/uL (ref 150.0–400.0)
RBC: 4.36 Mil/uL (ref 3.87–5.11)
RDW: 14.7 % (ref 11.5–15.5)
WBC: 8.1 10*3/uL (ref 4.0–10.5)

## 2014-01-08 LAB — TSH: TSH: 0.74 u[IU]/mL (ref 0.35–4.50)

## 2014-01-08 LAB — HEMOGLOBIN A1C: Hgb A1c MFr Bld: 5.5 % (ref 4.6–6.5)

## 2014-01-08 MED ORDER — MODAFINIL 100 MG PO TABS: 100.0000 mg | ORAL_TABLET | Freq: Every day | ORAL | Status: DC

## 2014-01-08 NOTE — Assessment & Plan Note (Signed)
She will cont provigil for this

## 2014-01-08 NOTE — Assessment & Plan Note (Signed)
Her BP is well controlled I will monitor her lytes and renal function today 

## 2014-01-08 NOTE — Assessment & Plan Note (Signed)
FLP today 

## 2014-01-08 NOTE — Assessment & Plan Note (Signed)
I will recheck her A1C to see if she has developed DM2  

## 2014-01-08 NOTE — Progress Notes (Signed)
Pre visit review using our clinic review tool, if applicable. No additional management support is needed unless otherwise documented below in the visit note. 

## 2014-01-08 NOTE — Patient Instructions (Signed)

## 2014-01-08 NOTE — Progress Notes (Signed)
Subjective:    Patient ID: Mary [name], female    DOB: 1950/05/08, 63 y.o.   MRN: [id number]  Hypertension This is a chronic problem. The current episode started more than 1 year ago. The problem has been gradually improving since onset. The problem is controlled. Associated symptoms include anxiety and neck pain. Pertinent negatives include no blurred vision, chest pain, headaches, malaise/fatigue, orthopnea, palpitations, peripheral edema, PND, shortness of breath or sweats. Past treatments include ACE inhibitors and diuretics. The current treatment provides significant improvement. Compliance problems include exercise and diet.       Review of Systems  Constitutional: Positive for fatigue. Negative for fever, chills, malaise/fatigue, diaphoresis and appetite change.       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  HENT: Negative.   Eyes: Negative.  Negative for blurred vision and visual disturbance.  Respiratory: Negative.  Negative for shortness of breath.   Cardiovascular: Negative.  Negative for chest pain, palpitations, orthopnea and PND.  Gastrointestinal: Negative.  Negative for nausea, vomiting, abdominal pain, diarrhea, constipation and blood in stool.  Endocrine: Negative.   Genitourinary: Negative.   Musculoskeletal: Positive for back pain, arthralgias and neck pain.  Skin: Negative.   Neurological: Negative.  Negative for headaches.  Hematological: Negative.  Negative for adenopathy. Does not bruise/bleed easily.  Psychiatric/Behavioral: Positive for sleep disturbance. Negative for suicidal ideas, hallucinations, behavioral problems, confusion, self-injury, dysphoric mood, decreased concentration and agitation. The patient is nervous/anxious. The patient is not hyperactive.        Objective:   Physical Exam  Constitutional: She is oriented to  person, place, and time. She appears well-developed and well-nourished. No distress.  HENT:  Head: Normocephalic and atraumatic.  Mouth/Throat: Oropharynx is clear and moist. No oropharyngeal exudate.  Eyes: Conjunctivae are normal. Right eye exhibits no discharge. Left eye exhibits no discharge. No scleral icterus.  Neck: Normal range of motion. Neck supple. No JVD present. No tracheal deviation present. No thyromegaly present.  Cardiovascular: Normal rate, regular rhythm, normal heart sounds and intact distal pulses.  Exam reveals no gallop and no friction rub.   No murmur heard. Pulmonary/Chest: Effort normal and breath sounds normal. No stridor. No respiratory distress. She has no wheezes. She has no rales. She exhibits no tenderness.  Abdominal: Soft. Bowel sounds are normal. She exhibits no distension and no mass. There is no tenderness. There is no rebound and no guarding.  Musculoskeletal: Normal range of motion. She exhibits no edema or tenderness.  Lymphadenopathy:    She has no cervical adenopathy.  Neurological: She is oriented to person, place, and time.  Skin: Skin is warm and dry. No rash noted. She is not diaphoretic. No erythema. No pallor.  Psychiatric: She has a normal mood and affect. Her behavior is normal. Judgment and thought content normal.  Vitals reviewed.    Lab Results  Component Value Date   WBC 7.2 06/17/2013   HGB 12.7 06/17/2013   HCT 37.9 06/17/2013   PLT 205.0 06/17/2013   GLUCOSE 119* 06/17/2013   CHOL 261* 01/31/2013   TRIG 189.0* 01/31/2013   HDL 59.20 01/31/2013   LDLDIRECT 167.1 01/31/2013   ALT 16 05/16/2012   AST 23 05/16/2012   NA 139 06/17/2013   K 4.9 06/17/2013   CL 101 06/17/2013   CREATININE 1.0 06/17/2013   BUN 30* 06/17/2013   CO2 29  06/17/2013   TSH 0.58 06/17/2013   HGBA1C 5.9 05/16/2012       Assessment & Plan:

## 2014-01-09 ENCOUNTER — Encounter: Payer: Self-pay | Admitting: Internal Medicine

## 2014-01-09 DIAGNOSIS — Z23 Encounter for immunization: Secondary | ICD-10-CM

## 2014-01-09 LAB — LIPID PANEL
Cholesterol: 280 mg/dL — ABNORMAL HIGH (ref 0–200)
HDL: 64.2 mg/dL (ref >39.00)
LDL Cholesterol: 197 mg/dL — ABNORMAL HIGH (ref 0–99)
NonHDL: 215.8
TRIGLYCERIDES: 96 mg/dL (ref 0.0–149.0)
Total CHOL/HDL Ratio: 4
VLDL: 19.2 mg/dL (ref 0.0–40.0)

## 2014-01-09 LAB — COMPREHENSIVE METABOLIC PANEL
ALK PHOS: 60 U/L (ref 39–117)
ALT: 13 U/L (ref 0–35)
AST: 21 U/L (ref 0–37)
Albumin: 4 g/dL (ref 3.5–5.2)
BUN: 25 mg/dL — AB (ref 6–23)
CO2: 29 mEq/L (ref 19–32)
CREATININE: 1.1 mg/dL (ref 0.4–1.2)
Calcium: 9.7 mg/dL (ref 8.4–10.5)
Chloride: 96 mEq/L (ref 96–112)
GFR: 52.4 mL/min — ABNORMAL LOW (ref >60.00)
Glucose, Bld: 147 mg/dL — ABNORMAL HIGH (ref 70–99)
Potassium: 3.6 mEq/L (ref 3.5–5.1)
Sodium: 136 mEq/L (ref 135–145)
Total Bilirubin: 0.3 mg/dL (ref 0.2–1.2)
Total Protein: 7.6 g/dL (ref 6.0–8.3)

## 2014-01-13 ENCOUNTER — Encounter: Payer: Self-pay | Admitting: Internal Medicine

## 2014-04-08 ENCOUNTER — Other Ambulatory Visit: Payer: Self-pay

## 2014-04-08 DIAGNOSIS — I10 Essential (primary) hypertension: Secondary | ICD-10-CM

## 2014-04-08 MED ORDER — FUROSEMIDE 20 MG PO TABS: 20.0000 mg | ORAL_TABLET | Freq: Two times a day (BID) | ORAL | Status: DC

## 2014-04-08 MED ORDER — LISINOPRIL 20 MG PO TABS: 20.0000 mg | ORAL_TABLET | Freq: Every day | ORAL | Status: DC

## 2014-06-11 ENCOUNTER — Telehealth: Payer: Self-pay | Admitting: Internal Medicine

## 2014-06-11 DIAGNOSIS — I10 Essential (primary) hypertension: Secondary | ICD-10-CM

## 2014-06-11 MED ORDER — LISINOPRIL 20 MG PO TABS: 20.0000 mg | ORAL_TABLET | Freq: Every day | ORAL | Status: DC

## 2014-06-11 MED ORDER — FUROSEMIDE 20 MG PO TABS: 20.0000 mg | ORAL_TABLET | Freq: Two times a day (BID) | ORAL | Status: DC

## 2014-06-11 NOTE — Telephone Encounter (Signed)
Fax refills to [facility]...Raechel Chute/lmb

## 2014-06-11 NOTE — Telephone Encounter (Signed)
Pt request refill for Lasix and lisinopril (PRINIVIL,ZESTRIL) 20 MG tablet [facility], phone # [phone]. Pt is no longer use [facility] anymore (please remove).   Pt is out of this med and need this ASAP.

## 2014-07-11 ENCOUNTER — Telehealth: Payer: Self-pay

## 2014-07-11 NOTE — Telephone Encounter (Signed)
Received refill request from [facility]  request refills for fioricet, and clnazepam . Both Rx last written 01/02/14. Please advise Thanks

## 2014-07-13 NOTE — Telephone Encounter (Signed)
No, she is overdue for an OV

## 2014-07-14 NOTE — Telephone Encounter (Signed)
Pt notified via mychart

## 2014-07-18 NOTE — Telephone Encounter (Signed)
See my chart message below, pt last in office11/2015. Thanks, PCP out of the office today.

## 2014-07-20 NOTE — Telephone Encounter (Signed)
I am unable to refill the medication per Dr. Yetta BarreJones previous message requiring an office visit.

## 2014-08-14 ENCOUNTER — Other Ambulatory Visit (INDEPENDENT_AMBULATORY_CARE_PROVIDER_SITE_OTHER): Payer: BLUE CROSS/BLUE SHIELD

## 2014-08-14 ENCOUNTER — Telehealth: Payer: Self-pay

## 2014-08-14 ENCOUNTER — Encounter: Payer: Self-pay | Admitting: Internal Medicine

## 2014-08-14 ENCOUNTER — Ambulatory Visit (INDEPENDENT_AMBULATORY_CARE_PROVIDER_SITE_OTHER): Payer: BLUE CROSS/BLUE SHIELD | Admitting: Internal Medicine

## 2014-08-14 VITALS — BP 130/78 | HR 76 | Temp 98.6°F | Resp 16 | Ht 65.0 in | Wt 138.2 lb

## 2014-08-14 DIAGNOSIS — G47419 Narcolepsy without cataplexy: Secondary | ICD-10-CM

## 2014-08-14 DIAGNOSIS — F419 Anxiety disorder, unspecified: Secondary | ICD-10-CM | POA: Diagnosis not present

## 2014-08-14 DIAGNOSIS — I10 Essential (primary) hypertension: Secondary | ICD-10-CM | POA: Diagnosis not present

## 2014-08-14 DIAGNOSIS — G44221 Chronic tension-type headache, intractable: Secondary | ICD-10-CM

## 2014-08-14 DIAGNOSIS — G4750 Parasomnia, unspecified: Secondary | ICD-10-CM | POA: Diagnosis not present

## 2014-08-14 DIAGNOSIS — R739 Hyperglycemia, unspecified: Secondary | ICD-10-CM

## 2014-08-14 LAB — HEMOGLOBIN A1C: HEMOGLOBIN A1C: 5.5 % (ref 4.6–6.5)

## 2014-08-14 LAB — BASIC METABOLIC PANEL
BUN: 19 mg/dL (ref 6–23)
CALCIUM: 10.2 mg/dL (ref 8.4–10.5)
CO2: 31 mEq/L (ref 19–32)
Chloride: 98 mEq/L (ref 96–112)
Creatinine, Ser: 1.03 mg/dL (ref 0.40–1.20)
GFR: 57.6 mL/min — AB (ref >60.00)
Glucose, Bld: 106 mg/dL — ABNORMAL HIGH (ref 70–99)
Potassium: 4.6 mEq/L (ref 3.5–5.1)
Sodium: 137 mEq/L (ref 135–145)

## 2014-08-14 MED ORDER — BUTALBITAL-APAP-CAFFEINE 50-325-40 MG PO TABS: ORAL_TABLET | ORAL | Status: DC

## 2014-08-14 MED ORDER — CLONAZEPAM 2 MG PO TABS: 2.0000 mg | ORAL_TABLET | Freq: Two times a day (BID) | ORAL | Status: DC | PRN

## 2014-08-14 NOTE — Telephone Encounter (Signed)
Patient had rx for Butalbital-acetaminophen -caffeine; 50-325-40 mg per tablet; take 1 to 2 tablets every 6 hours as needed for h/a and rx for klonopin 2mg  by mouth 2 times daily as needed for anxiety. Patient requested on her way out for these rx to be faxed to [facility].  Faxed over rx to [facility] as directed by the patient.  To check for receipt in am of 6/17

## 2014-08-14 NOTE — Patient Instructions (Signed)

## 2014-08-14 NOTE — Progress Notes (Signed)
Pre visit review using our clinic review tool, if applicable. No additional management support is needed unless otherwise documented below in the visit note. 

## 2014-08-14 NOTE — Progress Notes (Signed)
Subjective:  Patient ID: Mary [name], female    DOB: [birth year]  Age: 64 y.o. MRN: [id number]  CC: Hypertension   HPI Mary [name] presents for a BP check and a request for medication refills. She has no new complaints.  Outpatient Prescriptions Prior to Visit  Medication Sig Dispense Refill  . clobetasol cream (TEMOVATE) 0.05 % Apply topically 2 (two) times daily. 30 g 2  . furosemide (LASIX) 20 MG tablet Take 1 tablet (20 mg total) by mouth 2 (two) times daily. 180 tablet 2  . lisinopril (PRINIVIL,ZESTRIL) 20 MG tablet Take 1 tablet (20 mg total) by mouth daily. 90 tablet 2  . methocarbamol (ROBAXIN) 500 MG tablet Take 2 tabs four times daily    . modafinil (PROVIGIL) 100 MG tablet Take 1 tablet (100 mg total) by mouth daily. 30 tablet 5  . morphine (MS CONTIN) 30 MG 12 hr tablet every 8 (eight) hours.     Marland Kitchen oxyCODONE-acetaminophen (PERCOCET) 7.5-325 MG per tablet Take 1 tablet by mouth 3 (three) times daily as needed.    . butalbital-acetaminophen-caffeine (FIORICET, ESGIC) 50-325-40 MG per tablet TAKE 1 TO 2 TABLETS EVERY 6 HOURS AS NEEDED FOR HEADACHE 90 tablet 1  . clonazePAM (KLONOPIN) 2 MG tablet Take 1 tablet (2 mg total) by mouth 2 (two) times daily as needed for anxiety. 180 tablet 1  . mometasone (NASONEX) 50 MCG/ACT nasal spray Place 2 sprays into the nose daily. 17 g 0   No facility-administered medications prior to visit.    ROS Review of Systems  Constitutional: Positive for fatigue. Negative for fever, chills, diaphoresis, appetite change and unexpected weight change.       She complains of fatigue and sleepiness during the day  Eyes: Negative.  Negative for photophobia and visual disturbance.  Respiratory: Negative.  Negative for cough, choking, chest tightness, shortness of breath, wheezing and stridor.   Cardiovascular: Negative.  Negative for chest pain, palpitations and leg swelling.  Gastrointestinal: Negative.  Negative for nausea, vomiting, abdominal  pain, diarrhea, constipation and blood in stool.  Endocrine: Negative.   Genitourinary: Negative.   Musculoskeletal: Positive for back pain and neck pain. Negative for myalgias, joint swelling, arthralgias, gait problem and neck stiffness.       She has chronic,unchanged neck and low back pain  Skin: Negative.  Negative for rash.  Allergic/Immunologic: Negative.   Neurological: Positive for headaches. Negative for dizziness, tremors, seizures, syncope, facial asymmetry, speech difficulty, weakness, light-headedness and numbness.       She complains of intermittent, chronic tension-type headaches that are well controlled with fioricet, she requests a refill.  Hematological: Negative.  Negative for adenopathy. Does not bruise/bleed easily.  Psychiatric/Behavioral: Positive for sleep disturbance. Negative for suicidal ideas, hallucinations, behavioral problems, confusion, self-injury, dysphoric mood, decreased concentration and agitation. The patient is nervous/anxious. The patient is not hyperactive.        She complains of anxiety and occasional panic feelings as well as insomnia, she requests a refill on klonopin    Objective:  BP 130/78 mmHg  Pulse 76  Temp(Src) 98.6 F (37 C) (Oral)  Resp 16  Ht  (1.651 m)  Wt 138 lb 4 oz (62.71 kg)  BMI 23.01 kg/m2  SpO2 93%  BP Readings from Last 3 Encounters:  08/14/14 130/78  07/08/13 136/82  06/17/13 180/86    Wt Readings from Last 3 Encounters:  08/14/14 138 lb 4 oz (62.71 kg)  07/08/13 137 lb (62.143 kg)  06/17/13 140 lb (63.504  kg)    Physical Exam  Constitutional: She is oriented to person, place, and time. She appears well-developed and well-nourished. No distress.  HENT:  Head: Normocephalic and atraumatic.  Mouth/Throat: Oropharynx is clear and moist. No oropharyngeal exudate.  Eyes: Conjunctivae and EOM are normal. Pupils are equal, round, and reactive to light. Right eye exhibits no discharge. Left eye exhibits no  discharge. No scleral icterus.  Neck: Normal range of motion. Neck supple. No JVD present. No tracheal deviation present. No thyromegaly present.  Cardiovascular: Normal rate, regular rhythm, normal heart sounds and intact distal pulses.  Exam reveals no gallop and no friction rub.   No murmur heard. Pulmonary/Chest: Effort normal and breath sounds normal. No stridor. No respiratory distress. She has no wheezes. She has no rales. She exhibits no tenderness.  Abdominal: Soft. Bowel sounds are normal. She exhibits no distension and no mass. There is no tenderness. There is no rebound and no guarding.  Musculoskeletal: Normal range of motion. She exhibits no edema or tenderness.       Cervical back: Normal.       Lumbar back: Normal.  Lymphadenopathy:    She has no cervical adenopathy.  Neurological: She is oriented to person, place, and time.  Neg SLR in BLE  Skin: Skin is warm and dry. No rash noted. She is not diaphoretic. No erythema. No pallor.  Psychiatric: She has a normal mood and affect. Her behavior is normal. Judgment and thought content normal.  Vitals reviewed.   Lab Results  Component Value Date   WBC 8.1 01/08/2014   HGB 12.9 01/08/2014   HCT 38.6 01/08/2014   PLT 266.0 01/08/2014   GLUCOSE 106* 08/14/2014   CHOL 280* 01/08/2014   TRIG 96.0 01/08/2014   HDL 64.20 01/08/2014   LDLDIRECT 167.1 01/31/2013   LDLCALC 197* 01/08/2014   ALT 13 01/08/2014   AST 21 01/08/2014   NA 137 08/14/2014   K 4.6 08/14/2014   CL 98 08/14/2014   CREATININE 1.03 08/14/2014   BUN 19 08/14/2014   CO2 31 08/14/2014   TSH 0.74 01/08/2014   HGBA1C 5.5 08/14/2014    US Breast Right  09/02/2011   **ADDENDUM** CREATED: 09/02/2011 09:24:01  Correction to Screening Mammogram report:  The possible mass was seen in the right breast, not the left.  Correctly, the right breast received diagnostic mammography and ultrasound.  **END ADDENDUM** SIGNED BY: Esperanza Heir, M.D.  08/22/2011   *RADIOLOGY  REPORT*  Clinical Data:  The patient returns for evaluation of a possible mass in the right breast noted on recent screening study dated 08/01/2011.  The prior screening report incorrectly stated that the abnormality was in the left breast.  The left breast is normal. The possible mass was in the right breast.  DIGITAL DIAGNOSTIC RIGHT LIMITED MAMMOGRAM  AND RIGHT BREAST ULTRASOUND:  Comparison:  01/08/2003 from [facility]  Findings:  Additional views confirm the presence of a circumscribed oval nodule in the medial aspect of the right breast posteriorly. This has a dependent calcification within it.  On physical exam, no mass is palpated in the right breast.  Ultrasound is performed, showing two adjacent cysts at 2 o'clock 5 cm from the right nipple measuring 5 x 3 x 4 mm in overall size.  IMPRESSION: Right breast cysts.  No mammographic or sonographic evidence of malignancy.  RECOMMENDATION: Yearly screening mammography is suggested.  BI-RADS CATEGORY 2:  Benign finding(s).  Original Report Authenticated By: [email or web address]  [employer] R  09/02/2011   **ADDENDUM** CREATED: 09/02/2011 09:24:01  Correction to Screening Mammogram report:  The possible mass was seen in the right breast, not the left.  Correctly, the right breast received diagnostic mammography and ultrasound.  **END ADDENDUM** SIGNED BY: Esperanza Heir, M.D.  08/22/2011   *RADIOLOGY REPORT*  Clinical Data:  The patient returns for evaluation of a possible mass in the right breast noted on recent screening study dated 08/01/2011.  The prior screening report incorrectly stated that the abnormality was in the left breast.  The left breast is normal. The possible mass was in the right breast.  DIGITAL DIAGNOSTIC RIGHT LIMITED MAMMOGRAM  AND RIGHT BREAST ULTRASOUND:  Comparison:  01/08/2003 from [facility]  Findings:  Additional views confirm the presence of a circumscribed oval nodule  in the medial aspect of the right breast posteriorly. This has a dependent calcification within it.  On physical exam, no mass is palpated in the right breast.  Ultrasound is performed, showing two adjacent cysts at 2 o'clock 5 cm from the right nipple measuring 5 x 3 x 4 mm in overall size.  IMPRESSION: Right breast cysts.  No mammographic or sonographic evidence of malignancy.  RECOMMENDATION: Yearly screening mammography is suggested.  BI-RADS CATEGORY 2:  Benign finding(s).  Original Report Authenticated By: [email or web address]   Assessment & Plan:   Mary was seen today for hypertension.  Diagnoses and all orders for this visit:  Chronic tension-type headache, intractable Orders: -     butalbital-acetaminophen-caffeine (FIORICET, ESGIC) 50-325-40 MG per tablet; TAKE 1 TO 2 TABLETS EVERY 6 HOURS AS NEEDED FOR HEADACHE  Anxiety - she will not take an anti-depressant, will cont klonopin at her request Orders: -     clonazePAM (KLONOPIN) 2 MG tablet; Take 1 tablet (2 mg total) by mouth 2 (two) times daily as needed for anxiety.  Narcolepsy - her last sleep study was done in 2007, she wants to have the Rx for provigil covered by her insurance so I have asked her to see sleep medicine to consider having another sleep study done Orders: -     Ambulatory referral to Pulmonology  Parasomnia Orders: -     clonazePAM (KLONOPIN) 2 MG tablet; Take 1 tablet (2 mg total) by mouth 2 (two) times daily as needed for anxiety. -     Ambulatory referral to Pulmonology   I am having Ms. [name] maintain her oxyCODONE-acetaminophen, methocarbamol, mometasone, clobetasol cream, morphine, modafinil, lisinopril, furosemide, butalbital-acetaminophen-caffeine, and clonazePAM.  Meds ordered this encounter  Medications  . butalbital-acetaminophen-caffeine (FIORICET, ESGIC) 50-325-40 MG per tablet    Sig: TAKE 1 TO 2 TABLETS EVERY 6 HOURS AS NEEDED FOR HEADACHE    Dispense:  90 tablet    Refill:  1  . clonazePAM  (KLONOPIN) 2 MG tablet    Sig: Take 1 tablet (2 mg total) by mouth 2 (two) times daily as needed for anxiety.    Dispense:  180 tablet    Refill:  1     Follow-up: Return in about 4 months (around 12/14/2014).  Sanda Linger, MD

## 2014-08-15 NOTE — Telephone Encounter (Signed)
Both rx went through. No fax sheet indicating any issue with transmission.

## 2014-08-18 NOTE — Assessment & Plan Note (Signed)
Her BP is well controlled Lytes and renal function are stable 

## 2014-09-09 ENCOUNTER — Other Ambulatory Visit: Payer: Self-pay | Admitting: Internal Medicine

## 2014-11-27 ENCOUNTER — Institutional Professional Consult (permissible substitution): Payer: BLUE CROSS/BLUE SHIELD | Admitting: Internal Medicine

## 2014-12-16 ENCOUNTER — Ambulatory Visit: Payer: BLUE CROSS/BLUE SHIELD | Admitting: Internal Medicine

## 2014-12-16 DIAGNOSIS — Z0289 Encounter for other administrative examinations: Secondary | ICD-10-CM

## 2015-01-14 ENCOUNTER — Encounter (HOSPITAL_COMMUNITY): Payer: Self-pay

## 2015-01-14 ENCOUNTER — Ambulatory Visit (INDEPENDENT_AMBULATORY_CARE_PROVIDER_SITE_OTHER): Payer: BLUE CROSS/BLUE SHIELD | Admitting: Internal Medicine

## 2015-01-14 ENCOUNTER — Emergency Department (HOSPITAL_COMMUNITY)
Admission: EM | Admit: 2015-01-14 | Discharge: 2015-01-14 | Disposition: A | Discharge status: Home / Self Care | Payer: BLUE CROSS/BLUE SHIELD | Attending: Physician Assistant | Admitting: Physician Assistant

## 2015-01-14 ENCOUNTER — Encounter: Payer: Self-pay | Admitting: Internal Medicine

## 2015-01-14 DIAGNOSIS — Z79891 Long term (current) use of opiate analgesic: Secondary | ICD-10-CM | POA: Insufficient documentation

## 2015-01-14 DIAGNOSIS — Z79899 Other long term (current) drug therapy: Secondary | ICD-10-CM | POA: Insufficient documentation

## 2015-01-14 DIAGNOSIS — H9201 Otalgia, right ear: Secondary | ICD-10-CM | POA: Insufficient documentation

## 2015-01-14 DIAGNOSIS — J45909 Unspecified asthma, uncomplicated: Secondary | ICD-10-CM | POA: Diagnosis not present

## 2015-01-14 DIAGNOSIS — Z87891 Personal history of nicotine dependence: Secondary | ICD-10-CM | POA: Insufficient documentation

## 2015-01-14 DIAGNOSIS — E1122 Type 2 diabetes mellitus with diabetic chronic kidney disease: Secondary | ICD-10-CM | POA: Insufficient documentation

## 2015-01-14 DIAGNOSIS — Z880 Allergy status to penicillin: Secondary | ICD-10-CM | POA: Insufficient documentation

## 2015-01-14 DIAGNOSIS — N189 Chronic kidney disease, unspecified: Secondary | ICD-10-CM | POA: Insufficient documentation

## 2015-01-14 DIAGNOSIS — R5383 Other fatigue: Secondary | ICD-10-CM

## 2015-01-14 DIAGNOSIS — Z7952 Long term (current) use of systemic steroids: Secondary | ICD-10-CM | POA: Diagnosis not present

## 2015-01-14 DIAGNOSIS — M1990 Unspecified osteoarthritis, unspecified site: Secondary | ICD-10-CM | POA: Insufficient documentation

## 2015-01-14 DIAGNOSIS — Z8639 Personal history of other endocrine, nutritional and metabolic disease: Secondary | ICD-10-CM | POA: Diagnosis not present

## 2015-01-14 DIAGNOSIS — E876 Hypokalemia: Secondary | ICD-10-CM | POA: Diagnosis not present

## 2015-01-14 DIAGNOSIS — R404 Transient alteration of awareness: Secondary | ICD-10-CM | POA: Diagnosis not present

## 2015-01-14 DIAGNOSIS — J029 Acute pharyngitis, unspecified: Secondary | ICD-10-CM | POA: Diagnosis not present

## 2015-01-14 DIAGNOSIS — K5900 Constipation, unspecified: Secondary | ICD-10-CM | POA: Diagnosis not present

## 2015-01-14 DIAGNOSIS — G43909 Migraine, unspecified, not intractable, without status migrainosus: Secondary | ICD-10-CM | POA: Diagnosis not present

## 2015-01-14 DIAGNOSIS — Z9089 Acquired absence of other organs: Secondary | ICD-10-CM | POA: Insufficient documentation

## 2015-01-14 DIAGNOSIS — Z8719 Personal history of other diseases of the digestive system: Secondary | ICD-10-CM | POA: Insufficient documentation

## 2015-01-14 HISTORY — DX: Sjogren syndrome, unspecified: M35.00

## 2015-01-14 LAB — BASIC METABOLIC PANEL
ANION GAP: 12 (ref 5–15)
BUN: 14 mg/dL (ref 6–20)
CALCIUM: 9.9 mg/dL (ref 8.9–10.3)
CO2: 28 mmol/L (ref 22–32)
Chloride: 99 mmol/L — ABNORMAL LOW (ref 101–111)
Creatinine, Ser: 0.93 mg/dL (ref 0.44–1.00)
GLUCOSE: 90 mg/dL (ref 65–99)
Potassium: 3.3 mmol/L — ABNORMAL LOW (ref 3.5–5.1)
SODIUM: 139 mmol/L (ref 135–145)

## 2015-01-14 LAB — CBC
HCT: 42.8 % (ref 36.0–46.0)
HEMOGLOBIN: 13.7 g/dL (ref 12.0–15.0)
MCH: 29.8 pg (ref 26.0–34.0)
MCHC: 32 g/dL (ref 30.0–36.0)
MCV: 93 fL (ref 78.0–100.0)
PLATELETS: 251 10*3/uL (ref 150–400)
RBC: 4.6 MIL/uL (ref 3.87–5.11)
RDW: 13.2 % (ref 11.5–15.5)
WBC: 7.4 10*3/uL (ref 4.0–10.5)

## 2015-01-14 LAB — URINALYSIS, ROUTINE W REFLEX MICROSCOPIC
BILIRUBIN URINE: NEGATIVE
GLUCOSE, UA: NEGATIVE mg/dL
HGB URINE DIPSTICK: NEGATIVE
Ketones, ur: NEGATIVE mg/dL
Nitrite: NEGATIVE
Protein, ur: NEGATIVE mg/dL
SPECIFIC GRAVITY, URINE: 1.008 (ref 1.005–1.030)
pH: 5 (ref 5.0–8.0)

## 2015-01-14 LAB — CBG MONITORING, ED: GLUCOSE-CAPILLARY: 95 mg/dL (ref 65–99)

## 2015-01-14 LAB — URINE MICROSCOPIC-ADD ON
BACTERIA UA: NONE SEEN
RBC / HPF: NONE SEEN RBC/hpf (ref 0–5)

## 2015-01-14 NOTE — ED Provider Notes (Signed)
CSN: [id number]     Arrival date & time 01/14/15  1614 History   First MD Initiated Contact with Patient 01/14/15 2028     Chief Complaint  Patient presents with  . Sore Throat  . Fatigue     (Consider location/radiation/quality/duration/timing/severity/associated sxs/prior Treatment) HPI Comments: Mary [name] is a 64 y.o. female with a PMHx of asthma, arthritis, DM2, GERD, CKD, thyroid disease, Sjogren's syndrome, HLD, and chronic migraines who presents to the ED with complaints of sore throat 3 weeks which is gradually improved. She states she went to an urgent care and had a negative strep test, and that this symptom has gradually improved. She has had right ear pain and fatigue as well. She was seen by her regular doctor today, chart review reveals that Dr. Yetta Barre (PCP) who felt that her fatigue was due to overuse of her chronic narcotics, but wanted her evaluated for metabolic or infectious causes. Concerned that she may need to detox/withdraw from her pain medications.   She describes her right ear pain is 2/10 intermittent pressure-like pain which is nonradiating, with no known aggravating factors, and relieved with "popping her ear". She reports that all her symptoms are gradually improving. She is unsure why her primary care doctor sent her here. She denies any fevers, chills, chest pain, shortness breath, drooling, trismus, ear drainage, rhinorrhea, cough, abdominal pain, nausea, vomiting, diarrhea, melena, hematochezia, dysuria, hematuria, vaginal bleeding or discharge, numbness, tingling, weakness, headache, or vision changes. She states she has chronic constipation which is unchanged from baseline.  Patient is a 64 y.o. female presenting with pharyngitis. The history is provided by the patient and medical records. No language interpreter was used.  Sore Throat This is a new problem. The current episode started 1 to 4 weeks ago. The problem occurs constantly. The problem has been  gradually improving. Associated symptoms include a sore throat. Pertinent negatives include no abdominal pain, arthralgias, chest pain, chills, fever, headaches, myalgias, nausea, numbness, urinary symptoms, vomiting or weakness. Nothing aggravates the symptoms. She has tried nothing for the symptoms. The treatment provided no relief.    Past Medical History  Diagnosis Date  . Asthma   . Arthritis   . Diabetes mellitus   . Headache(784.0)   . GERD (gastroesophageal reflux disease)   . Allergy   . Chronic kidney disease   . Thyroid disease   . Migraines   . High cholesterol   . Sjogrens syndrome [facility])    Past Surgical History  Procedure Laterality Date  . Appendectomy    . Abdominal hysterectomy  1998  . Tonsillectomy  1971  . Breast biopsy  1971  . Lumbar fusion  2005  . Anterior cervical decomp/discectomy fusion  2011  . Kidney stone surgery  2011   Family History  Problem Relation Age of Onset  . Cancer Neg Hx   . Early death Neg Hx   . Heart disease Neg Hx   . Hyperlipidemia Neg Hx   . Hypertension Neg Hx   . Stroke Neg Hx    Social History  Substance Use Topics  . Smoking status: Former Games developer  . Smokeless tobacco: None  . Alcohol Use: No   OB History    No data available     Review of Systems  Constitutional: Negative for fever and chills.  HENT: Positive for ear pain and sore throat. Negative for drooling, ear discharge, rhinorrhea and trouble swallowing.   Eyes: Negative for pain, discharge and visual disturbance.  Respiratory: Negative  for shortness of breath.   Cardiovascular: Negative for chest pain.  Gastrointestinal: Positive for constipation (chronic and unchanged). Negative for nausea, vomiting, abdominal pain, diarrhea and blood in stool.  Genitourinary: Negative for dysuria, hematuria, vaginal bleeding and vaginal discharge.  Musculoskeletal: Negative for myalgias and arthralgias.  Skin: Negative for color change.  Allergic/Immunologic: Negative  for immunocompromised state.  Neurological: Negative for weakness, numbness and headaches.  Psychiatric/Behavioral: Negative for confusion.   10 Systems reviewed and are negative for acute change except as noted in the HPI.    Allergies  Sulfa antibiotics; Adhesive; Celebrex; Lyrica; Macrobid; Penicillins; Tribenzor; Tricor; Vioxx; and Cephalexin  Home Medications   Prior to Admission medications   Medication Sig Start Date End Date Taking? Authorizing Provider  butalbital-acetaminophen-caffeine (FIORICET, ESGIC) 50-325-40 MG per tablet TAKE 1 TO 2 TABLETS EVERY 6 HOURS AS NEEDED FOR HEADACHE 08/14/14   Etta Grandchildhomas L Jones, MD  clobetasol cream (TEMOVATE) 0.05 % Apply topically 2 (two) times daily. 09/12/11   Etta Grandchildhomas L Jones, MD  clonazePAM (KLONOPIN) 2 MG tablet Take 1 tablet (2 mg total) by mouth 2 (two) times daily as needed for anxiety. 08/14/14   Etta Grandchildhomas L Jones, MD  furosemide (LASIX) 20 MG tablet Take 1 tablet (20 mg total) by mouth 2 (two) times daily. 06/11/14   Etta Grandchildhomas L Jones, MD  lidocaine (XYLOCAINE) 2 % solution  01/01/15   Historical Provider, MD  lisinopril (PRINIVIL,ZESTRIL) 20 MG tablet Take 1 tablet (20 mg total) by mouth daily. 06/11/14   Etta Grandchildhomas L Jones, MD  methocarbamol (ROBAXIN) 500 MG tablet Take 2 tabs four times daily    Historical Provider, MD  morphine (MS CONTIN) 30 MG 12 hr tablet every 8 (eight) hours.  08/25/11   Historical Provider, MD  oxyCODONE-acetaminophen (PERCOCET) 7.5-325 MG per tablet Take 1 tablet by mouth 3 (three) times daily as needed.    Historical Provider, MD   BP 134/70 mmHg  Pulse 51  Temp(Src) 98.1 F (36.7 C) (Oral)  Resp 20  SpO2 100% Physical Exam  Constitutional: She is oriented to person, place, and time. Vital signs are normal. She appears well-developed and well-nourished.  Non-toxic appearance. No distress.  Afebrile, nontoxic, NAD. Thin frail woman  HENT:  Head: Normocephalic and atraumatic.  Right Ear: Hearing, tympanic membrane,  external ear and ear canal normal.  Left Ear: Hearing, tympanic membrane, external ear and ear canal normal.  Nose: Nose normal.  Mouth/Throat: Uvula is midline and mucous membranes are normal. No trismus in the jaw. No uvula swelling. Posterior oropharyngeal erythema present. No oropharyngeal exudate, posterior oropharyngeal edema or tonsillar abscesses.  Ears are clear bilaterally. Nose clear. Oropharynx with trace erythema, without uvular swelling or deviation, no trismus or drooling, no tonsils present, no exudates.  No PTA.  Eyes: Conjunctivae and EOM are normal. Right eye exhibits no discharge. Left eye exhibits no discharge.  Neck: Normal range of motion. Neck supple.  Cardiovascular: Normal rate, regular rhythm, normal heart sounds and intact distal pulses.  Exam reveals no gallop and no friction rub.   No murmur heard. Pulmonary/Chest: Effort normal and breath sounds normal. No respiratory distress. She has no decreased breath sounds. She has no wheezes. She has no rhonchi. She has no rales.  Abdominal: Soft. Normal appearance and bowel sounds are normal. She exhibits no distension. There is no tenderness. There is no rigidity, no rebound, no guarding, no CVA tenderness, no tenderness at McBurney's point and negative Murphy's sign.  Musculoskeletal: Normal range of motion.  Neurological:  She is alert and oriented to person, place, and time. She has normal strength. No sensory deficit.  No focal neuro deficits  Skin: Skin is warm, dry and intact. No rash noted.  Psychiatric: She has a normal mood and affect.  Nursing note and vitals reviewed.   ED Course  Procedures (including critical care time) Labs Review Labs Reviewed  BASIC METABOLIC PANEL - Abnormal; Notable for the following:    Potassium 3.3 (*)    Chloride 99 (*)    All other components within normal limits  URINALYSIS, ROUTINE W REFLEX MICROSCOPIC (NOT AT [facility]) - Abnormal; Notable for the following:    Leukocytes, UA  SMALL (*)    All other components within normal limits  URINE MICROSCOPIC-ADD ON - Abnormal; Notable for the following:    Squamous Epithelial / LPF 0-5 (*)    All other components within normal limits  CBC  CBG MONITORING, ED    Imaging Review No results found. I have personally reviewed and evaluated these images and lab results as part of my medical decision-making.   EKG Interpretation None      MDM   Final diagnoses:  Pharyngitis  Sore throat  Right ear pain  Other fatigue  Hypokalemia    64 y.o. female here with sore throat 3 weeks which is gradually improving, and right ear pressure-like pain as well as fatigue. According to chart review, PCP sent her here for evaluation of fatigue and concern for detox from narcotics. On exam, patient is alert and oriented, throat clear, ears clear bilaterally. Discussed that this is likely a viral URI which is improving. Lab results all unremarkable aside from mild hypokalemia at 3.3, doubt need for intervention, discussed foods with potassium to supplement. Doubt need for strep testing, pt states she had neg strep already and clinically she has a low CENTOR score. Doubt need for mono given that this will not change management of her course. Will have her f/up with PCP in 3-5 days. Discussed symptomatic control. I explained the diagnosis and have given explicit precautions to return to the ER including for any other new or worsening symptoms. The patient understands and accepts the medical plan as it's been dictated and I have answered their questions. Discharge instructions concerning home care and prescriptions have been given. The patient is STABLE and is discharged to home in good condition.  BP 98/56 mmHg  Pulse 65  Temp(Src) 97.9 F (36.6 C) (Oral)  Resp 18  SpO2 97%  No orders of the defined types were placed in this encounter.       Janilah Hojnacki Camprubi-Soms, PA-C 01/14/15 2107  Courteney Randall An, MD 01/14/15 2316

## 2015-01-14 NOTE — ED Notes (Signed)
Per PCP's office, they are concerned that the Pt has been drinking or taking too much of her pain medications.

## 2015-01-14 NOTE — Discharge Instructions (Signed)
Continue to stay well-hydrated. Gargle warm salt water and spit it out. Continue to alternate between Tylenol and Ibuprofen for pain or fever. Consider an over the counter antihistamine like zyrtec or claritin for your symptoms. Increase the potassium in your diet using the list of foods below. Followup with your primary care doctor in 3-5 days for recheck of ongoing symptoms. Return to emergency department for emergent changing or worsening of symptoms.   Pharyngitis Pharyngitis is redness, pain, and swelling (inflammation) of your pharynx.  CAUSES  Pharyngitis is usually caused by infection. Most of the time, these infections are from viruses (viral) and are part of a cold. However, sometimes pharyngitis is caused by bacteria (bacterial). Pharyngitis can also be caused by allergies. Viral pharyngitis may be spread from person to person by coughing, sneezing, and personal items or utensils (cups, forks, spoons, toothbrushes). Bacterial pharyngitis may be spread from person to person by more intimate contact, such as kissing.  SIGNS AND SYMPTOMS  Symptoms of pharyngitis include:   Sore throat.   Tiredness (fatigue).   Low-grade fever.   Headache.  Joint pain and muscle aches.  Skin rashes.  Swollen lymph nodes.  Plaque-like film on throat or tonsils (often seen with bacterial pharyngitis). DIAGNOSIS  Your health care provider will ask you questions about your illness and your symptoms. Your medical history, along with a physical exam, is often all that is needed to diagnose pharyngitis. Sometimes, a rapid strep test is done. Other lab tests may also be done, depending on the suspected cause.  TREATMENT  Viral pharyngitis will usually get better in 3-4 days without the use of medicine. Bacterial pharyngitis is treated with medicines that kill germs (antibiotics).  HOME CARE INSTRUCTIONS   Drink enough water and fluids to keep your urine clear or pale yellow.   Only take  over-the-counter or prescription medicines as directed by your health care provider:   If you are prescribed antibiotics, make sure you finish them even if you start to feel better.   Do not take aspirin.   Get lots of rest.   Gargle with 8 oz of salt water ( tsp of salt per 1 qt of water) as often as every 1-2 hours to soothe your throat.   Throat lozenges (if you are not at risk for choking) or sprays may be used to soothe your throat. SEEK MEDICAL CARE IF:   You have large, tender lumps in your neck.  You have a rash.  You cough up green, yellow-brown, or bloody spit. SEEK IMMEDIATE MEDICAL CARE IF:   Your neck becomes stiff.  You drool or are unable to swallow liquids.  You vomit or are unable to keep medicines or liquids down.  You have severe pain that does not go away with the use of recommended medicines.  You have trouble breathing (not caused by a stuffy nose). MAKE SURE YOU:   Understand these instructions.  Will watch your condition.  Will get help right away if you are not doing well or get worse.   This information is not intended to replace advice given to you by your health care provider. Make sure you discuss any questions you have with your health care provider.   Document Released: 02/14/2005 Document Revised: 12/05/2012 Document Reviewed: 10/22/2012 Elsevier Interactive Patient Education 2016 [employer].  Fatigue Fatigue is feeling tired all of the time, a lack of energy, or a lack of motivation. Occasional or mild fatigue is often a normal response to activity  or life in general. However, long-lasting (chronic) or extreme fatigue may indicate an underlying medical condition. HOME CARE INSTRUCTIONS  Watch your fatigue for any changes. The following actions may help to lessen any discomfort you are feeling:  Talk to your health care provider about how much sleep you need each night. Try to get the required amount every night.  Take  medicines only as directed by your health care provider.  Eat a healthy and nutritious diet. Ask your health care provider if you need help changing your diet.  Drink enough fluid to keep your urine clear or pale yellow.  Practice ways of relaxing, such as yoga, meditation, massage therapy, or acupuncture.  Exercise regularly.   Change situations that cause you stress. Try to keep your work and personal routine reasonable.  Do not abuse illegal drugs.  Limit alcohol intake to no more than 1 drink per day for nonpregnant women and 2 drinks per day for men. One drink equals 12 ounces of beer, 5 ounces of wine, or 1 ounces of hard liquor.  Take a multivitamin, if directed by your health care provider. SEEK MEDICAL CARE IF:   Your fatigue does not get better.  You have a fever.   You have unintentional weight loss or gain.  You have headaches.   You have difficulty:   Falling asleep.  Sleeping throughout the night.  You feel angry, guilty, anxious, or sad.   You are unable to have a bowel movement (constipation).   You skin is dry.   Your legs or another part of your body is swollen.  SEEK IMMEDIATE MEDICAL CARE IF:   You feel confused.   Your vision is blurry.  You feel faint or pass out.   You have a severe headache.   You have severe abdominal, pelvic, or back pain.   You have chest pain, shortness of breath, or an irregular or fast heartbeat.   You are unable to urinate or you urinate less than normal.   You develop abnormal bleeding, such as bleeding from the rectum, vagina, nose, lungs, or nipples.  You vomit blood.   You have thoughts about harming yourself or committing suicide.   You are worried that you might harm someone else.    This information is not intended to replace advice given to you by your health care provider. Make sure you discuss any questions you have with your health care provider.   Document Released:  12/12/2006 Document Revised: 03/07/2014 Document Reviewed: 06/18/2013 Elsevier Interactive Patient Education 2016 [employer].  Hypokalemia Hypokalemia means that the amount of potassium in the blood is lower than normal.Potassium is a chemical, called an electrolyte, that helps regulate the amount of fluid in the body. It also stimulates muscle contraction and helps nerves function properly.Most of the body's potassium is inside of cells, and only a very small amount is in the blood. Because the amount in the blood is so small, minor changes can be life-threatening. CAUSES  Antibiotics.  Diarrhea or vomiting.  Using laxatives too much, which can cause diarrhea.  Chronic kidney disease.  Water pills (diuretics).  Eating disorders (bulimia).  Low magnesium level.  Sweating a lot. SIGNS AND SYMPTOMS  Weakness.  Constipation.  Fatigue.  Muscle cramps.  Mental confusion.  Skipped heartbeats or irregular heartbeat (palpitations).  Tingling or numbness. DIAGNOSIS  Your health care provider can diagnose hypokalemia with blood tests. In addition to checking your potassium level, your health care provider may also check other  lab tests. TREATMENT Hypokalemia can be treated with potassium supplements taken by mouth or adjustments in your current medicines. If your potassium level is very low, you may need to get potassium through a vein (IV) and be monitored in the hospital. A diet high in potassium is also helpful. Foods high in potassium are:  Nuts, such as peanuts and pistachios.  Seeds, such as sunflower seeds and pumpkin seeds.  Peas, lentils, and lima beans.  Whole grain and bran cereals and breads.  Fresh fruit and vegetables, such as apricots, avocado, bananas, cantaloupe, kiwi, oranges, tomatoes, asparagus, and potatoes.  Orange and tomato juices.  Red meats.  Fruit yogurt. HOME CARE INSTRUCTIONS  Take all medicines as prescribed by your health care  provider.  Maintain a healthy diet by including nutritious food, such as fruits, vegetables, nuts, whole grains, and lean meats.  If you are taking a laxative, be sure to follow the directions on the label. SEEK MEDICAL CARE IF:  Your weakness gets worse.  You feel your heart pounding or racing.  You are vomiting or having diarrhea.  You are diabetic and having trouble keeping your blood glucose in the normal range. SEEK IMMEDIATE MEDICAL CARE IF:  You have chest pain, shortness of breath, or dizziness.  You are vomiting or having diarrhea for more than 2 days.  You faint. MAKE SURE YOU:   Understand these instructions.  Will watch your condition.  Will get help right away if you are not doing well or get worse.   This information is not intended to replace advice given to you by your health care provider. Make sure you discuss any questions you have with your health care provider.   Document Released: 02/14/2005 Document Revised: 03/07/2014 Document Reviewed: 08/17/2012 Elsevier Interactive Patient Education 2016 [employer]  Potassium Content of Foods Potassium is a mineral found in many foods and drinks. It helps keep fluids and minerals balanced in your body and affects how steadily your heart beats. Potassium also helps control your blood pressure and keep your muscles and nervous system healthy. Certain health conditions and medicines may change the balance of potassium in your body. When this happens, you can help balance your level of potassium through the foods that you do or do not eat. Your health care provider or dietitian may recommend an amount of potassium that you should have each day. The following lists of foods provide the amount of potassium (in parentheses) per serving in each item. HIGH IN POTASSIUM  The following foods and beverages have 200 mg or more of potassium per serving:  Apricots, 2 raw or 5 dry (200 mg).  Artichoke, 1 medium (345  mg).  Avocado, raw,  each (245 mg).  Banana, 1 medium (425 mg).  Beans, lima, or baked beans, canned,  cup (280 mg).  Beans, white, canned,  cup (595 mg).  Beef roast, 3 oz (320 mg).  Beef, ground, 3 oz (270 mg).  Beets, raw or cooked,  cup (260 mg).  Bran muffin, 2 oz (300 mg).  Broccoli,  cup (230 mg).  Brussels sprouts,  cup (250 mg).  Cantaloupe,  cup (215 mg).  Cereal, 100% bran,  cup (200-400 mg).  Cheeseburger, single, fast food, 1 each (225-400 mg).  Chicken, 3 oz (220 mg).  Clams, canned, 3 oz (535 mg).  Crab, 3 oz (225 mg).  Dates, 5 each (270 mg).  Dried beans and peas,  cup (300-475 mg).  Figs, dried, 2 each (260 mg).  Fish: halibut, tuna, cod, snapper, 3 oz (480 mg).  Fish: salmon, haddock, swordfish, perch, 3 oz (300 mg).  Fish, tuna, canned 3 oz (200 mg).  [place] fries, fast food, 3 oz (470 mg).  Granola with fruit and nuts,  cup (200 mg).  Grapefruit juice,  cup (200 mg).  Greens, beet,  cup (655 mg).  Honeydew melon,  cup (200 mg).  Kale, raw, 1 cup (300 mg).  Kiwi, 1 medium (240 mg).  Kohlrabi, rutabaga, parsnips,  cup (280 mg).  Lentils,  cup (365 mg).  Mango, 1 each (325 mg).  Milk, chocolate, 1 cup (420 mg).  Milk: nonfat, low-fat, whole, buttermilk, 1 cup (350-380 mg).  Molasses, 1 Tbsp (295 mg).  Mushrooms,  cup (280) mg.  Nectarine, 1 each (275 mg).  Nuts: almonds, peanuts, hazelnuts, [place], cashew, mixed, 1 oz (200 mg).  Nuts, pistachios, 1 oz (295 mg).  Orange, 1 each (240 mg).  Orange juice,  cup (235 mg).  Papaya, medium,  fruit (390 mg).  Peanut butter, chunky, 2 Tbsp (240 mg).  Peanut butter, smooth, 2 Tbsp (210 mg).  Pear, 1 medium (200 mg).  Pomegranate, 1 whole (400 mg).  Pomegranate juice,  cup (215 mg).  Pork, 3 oz (350 mg).  Potato chips, salted, 1 oz (465 mg).  Potato, baked with skin, 1 medium (925 mg).  Potatoes, boiled,  cup (255 mg).  Potatoes, mashed,   cup (330 mg).  Prune juice,  cup (370 mg).  Prunes, 5 each (305 mg).  Pudding, chocolate,  cup (230 mg).  Pumpkin, canned,  cup (250 mg).  Raisins, seedless,  cup (270 mg).  Seeds, sunflower or pumpkin, 1 oz (240 mg).  Soy milk, 1 cup (300 mg).  Spinach,  cup (420 mg).  Spinach, canned,  cup (370 mg).  Sweet potato, baked with skin, 1 medium (450 mg).  Swiss chard,  cup (480 mg).  Tomato or vegetable juice,  cup (275 mg).  Tomato sauce or puree,  cup (400-550 mg).  Tomato, raw, 1 medium (290 mg).  Tomatoes, canned,  cup (200-300 mg).  [place], 3 oz (250 mg).  Wheat germ, 1 oz (250 mg).  Winter squash,  cup (250 mg).  Yogurt, plain or fruited, 6 oz (260-435 mg).  Zucchini,  cup (220 mg). MODERATE IN POTASSIUM The following foods and beverages have 50-200 mg of potassium per serving:  Apple, 1 each (150 mg).  Apple juice,  cup (150 mg).  Applesauce,  cup (90 mg).  Apricot nectar,  cup (140 mg).  Asparagus, small spears,  cup or 6 spears (155 mg).  Bagel, cinnamon raisin, 1 each (130 mg).  Bagel, egg or plain, 4 in., 1 each (70 mg).  Beans, green,  cup (90 mg).  Beans, yellow,  cup (190 mg).  Beer, regular, 12 oz (100 mg).  Beets, canned,  cup (125 mg).  Blackberries,  cup (115 mg).  Blueberries,  cup (60 mg).  Bread, whole wheat, 1 slice (70 mg).  Broccoli, raw,  cup (145 mg).  Cabbage,  cup (150 mg).  Carrots, cooked or raw,  cup (180 mg).  Cauliflower, raw,  cup (150 mg).  Celery, raw,  cup (155 mg).  Cereal, bran flakes, cup (120-150 mg).  Cheese, cottage,  cup (110 mg).  Cherries, 10 each (150 mg).  Chocolate, 1 oz bar (165 mg).  Coffee, brewed 6 oz (90 mg).  Corn,  cup or 1 ear (195 mg).  Cucumbers,  cup (80 mg).  Egg,  large, 1 each (60 mg).  Eggplant,  cup (60 mg).  Endive, raw, cup (80 mg).  English muffin, 1 each (65 mg).  Fish, orange roughy, 3 oz (150 mg).  Frankfurter,  beef or pork, 1 each (75 mg).  Fruit cocktail,  cup (115 mg).  Grape juice,  cup (170 mg).  Grapefruit,  fruit (175 mg).  Grapes,  cup (155 mg).  Greens: kale, turnip, collard,  cup (110-150 mg).  Ice cream or frozen yogurt, chocolate,  cup (175 mg).  Ice cream or frozen yogurt, vanilla,  cup (120-150 mg).  Lemons, limes, 1 each (80 mg).  Lettuce, all types, 1 cup (100 mg).  Mixed vegetables,  cup (150 mg).  Mushrooms, raw,  cup (110 mg).  Nuts: walnuts, pecans, or macadamia, 1 oz (125 mg).  Oatmeal,  cup (80 mg).  Okra,  cup (110 mg).  Onions, raw,  cup (120 mg).  Peach, 1 each (185 mg).  Peaches, canned,  cup (120 mg).  Pears, canned,  cup (120 mg).  Peas, green, frozen,  cup (90 mg).  Peppers, green,  cup (130 mg).  Peppers, red,  cup (160 mg).  Pineapple juice,  cup (165 mg).  Pineapple, fresh or canned,  cup (100 mg).  Plums, 1 each (105 mg).  Pudding, vanilla,  cup (150 mg).  Raspberries,  cup (90 mg).  Rhubarb,  cup (115 mg).  Rice, wild,  cup (80 mg).  Shrimp, 3 oz (155 mg).  Spinach, raw, 1 cup (170 mg).  Strawberries,  cup (125 mg).  Summer squash  cup (175-200 mg).  Swiss chard, raw, 1 cup (135 mg).  Tangerines, 1 each (140 mg).  Tea, brewed, 6 oz (65 mg).  Turnips,  cup (140 mg).  Watermelon,  cup (85 mg).  Wine, red, table, 5 oz (180 mg).  Wine, white, table, 5 oz (100 mg). LOW IN POTASSIUM The following foods and beverages have less than 50 mg of potassium per serving.  Bread, white, 1 slice (30 mg).  Carbonated beverages, 12 oz (less than 5 mg).  Cheese, 1 oz (20-30 mg).  Cranberries,  cup (45 mg).  Cranberry juice cocktail,  cup (20 mg).  Fats and oils, 1 Tbsp (less than 5 mg).  Hummus, 1 Tbsp (32 mg).  Nectar: papaya, mango, or pear,  cup (35 mg).  Rice, white or brown,  cup (50 mg).  Spaghetti or macaroni,  cup cooked (30 mg).  Tortilla, flour or corn, 1 each (50  mg).  Waffle, 4 in., 1 each (50 mg).  Water chestnuts,  cup (40 mg).   This information is not intended to replace advice given to you by your health care provider. Make sure you discuss any questions you have with your health care provider.   Document Released: 09/28/2004 Document Revised: 02/19/2013 Document Reviewed: 01/11/2013 Elsevier Interactive Patient Education [employer]

## 2015-01-14 NOTE — Progress Notes (Signed)
Subjective:  Patient ID: Mary [name], female    DOB: [birth year]  Age: 64 y.o. MRN: [id number]  CC: Fatigue   HPI Mary [name] presents for a 3 week history of sore throat, dry mouth, fatigue, lethargy, somnolence, chills, and a few episodes of fever.  Outpatient Prescriptions Prior to Visit  Medication Sig Dispense Refill  . butalbital-acetaminophen-caffeine (FIORICET, ESGIC) 50-325-40 MG per tablet TAKE 1 TO 2 TABLETS EVERY 6 HOURS AS NEEDED FOR HEADACHE 90 tablet 1  . clobetasol cream (TEMOVATE) 0.05 % Apply topically 2 (two) times daily. 30 g 2  . clonazePAM (KLONOPIN) 2 MG tablet Take 1 tablet (2 mg total) by mouth 2 (two) times daily as needed for anxiety. 180 tablet 1  . furosemide (LASIX) 20 MG tablet Take 1 tablet (20 mg total) by mouth 2 (two) times daily. 180 tablet 2  . lisinopril (PRINIVIL,ZESTRIL) 20 MG tablet Take 1 tablet (20 mg total) by mouth daily. 90 tablet 2  . methocarbamol (ROBAXIN) 500 MG tablet Take 2 tabs four times daily    . morphine (MS CONTIN) 30 MG 12 hr tablet every 8 (eight) hours.     Marland Kitchen oxyCODONE-acetaminophen (PERCOCET) 7.5-325 MG per tablet Take 1 tablet by mouth 3 (three) times daily as needed.    . modafinil (PROVIGIL) 100 MG tablet Take 1 tablet (100 mg total) by mouth daily. 30 tablet 5  . mometasone (NASONEX) 50 MCG/ACT nasal spray Place 2 sprays into the nose daily. 17 g 0   No facility-administered medications prior to visit.    ROS Review of Systems  Constitutional: Positive for fever, chills, activity change and fatigue. Negative for diaphoresis, appetite change and unexpected weight change.  HENT: Positive for facial swelling and sinus pressure. Negative for congestion, postnasal drip, rhinorrhea, sneezing, sore throat and trouble swallowing.   Eyes: Negative.   Respiratory: Negative.  Negative for cough, choking, chest tightness, shortness of breath and stridor.   Cardiovascular: Negative.  Negative for chest pain, palpitations  and leg swelling.  Gastrointestinal: Negative.  Negative for nausea, vomiting, abdominal pain, diarrhea, constipation and blood in stool.  Endocrine: Negative.   Genitourinary: Negative.   Musculoskeletal: Negative.  Negative for myalgias, back pain, joint swelling and arthralgias.  Skin: Negative.  Negative for rash.  Allergic/Immunologic: Negative.   Neurological: Negative.  Negative for dizziness, tremors, light-headedness and numbness.  Hematological: Negative.  Negative for adenopathy. Does not bruise/bleed easily.  Psychiatric/Behavioral: Positive for confusion, sleep disturbance, dysphoric mood and decreased concentration. Negative for agitation. The patient is nervous/anxious.     Objective:  BP 138/70 mmHg  Pulse 88  Temp(Src) 98.6 F (37 C) (Oral)  Ht  (1.651 m)  Wt 138 lb (62.596 kg)  BMI 22.96 kg/m2  SpO2 98%  BP Readings from Last 3 Encounters:  01/14/15 134/70  01/14/15 138/70  08/14/14 130/78    Wt Readings from Last 3 Encounters:  01/14/15 138 lb (62.596 kg)  08/14/14 138 lb 4 oz (62.71 kg)  07/08/13 137 lb (62.143 kg)    Physical Exam  Constitutional: She is oriented to person, place, and time.  Non-toxic appearance. She has a sickly appearance. She appears ill. She appears distressed.  She is pale, lethargic, somnolent, disoriented, and staggers.  HENT:  Mouth/Throat: Oropharynx is clear and moist. No oropharyngeal exudate.  Eyes: Conjunctivae are normal. Right eye exhibits no discharge. Left eye exhibits no discharge. No scleral icterus.  Neck: Normal range of motion. Neck supple. No JVD present. No tracheal deviation present.  No thyromegaly present.  Cardiovascular: Normal rate, regular rhythm, normal heart sounds and intact distal pulses.  Exam reveals no gallop and no friction rub.   No murmur heard. Pulmonary/Chest: Effort normal and breath sounds normal. No stridor. No respiratory distress. She has no wheezes. She has no rales. She exhibits no  tenderness.  Abdominal: Soft. Bowel sounds are normal. She exhibits no distension and no mass. There is no tenderness. There is no rebound and no guarding.  Musculoskeletal: Normal range of motion. She exhibits no edema or tenderness.  Lymphadenopathy:    She has no cervical adenopathy.  Neurological: She is oriented to person, place, and time.  Skin: Skin is warm and dry. No rash noted. She is not diaphoretic. No erythema. No pallor.    Lab Results  Component Value Date   WBC 8.1 01/08/2014   HGB 12.9 01/08/2014   HCT 38.6 01/08/2014   PLT 266.0 01/08/2014   GLUCOSE 106* 08/14/2014   CHOL 280* 01/08/2014   TRIG 96.0 01/08/2014   HDL 64.20 01/08/2014   LDLDIRECT 167.1 01/31/2013   LDLCALC 197* 01/08/2014   ALT 13 01/08/2014   AST 21 01/08/2014   NA 137 08/14/2014   K 4.6 08/14/2014   CL 98 08/14/2014   CREATININE 1.03 08/14/2014   BUN 19 08/14/2014   CO2 31 08/14/2014   TSH 0.74 01/08/2014   HGBA1C 5.5 08/14/2014    US Breast Right  09/02/2011  **ADDENDUM** CREATED: 09/02/2011 09:24:01 Correction to Screening Mammogram report:  The possible mass was seen in the right breast, not the left.  Correctly, the right breast received diagnostic mammography and ultrasound. **END ADDENDUM** SIGNED BY: Esperanza Heir, M.D.  08/22/2011  *RADIOLOGY REPORT* Clinical Data:  The patient returns for evaluation of a possible mass in the right breast noted on recent screening study dated 08/01/2011.  The prior screening report incorrectly stated that the abnormality was in the left breast.  The left breast is normal. The possible mass was in the right breast. DIGITAL DIAGNOSTIC RIGHT LIMITED MAMMOGRAM  AND RIGHT BREAST ULTRASOUND: Comparison:  01/08/2003 from [facility] Radiology [facility] Findings:  Additional views confirm the presence of a circumscribed oval nodule in the medial aspect of the right breast posteriorly. This has a dependent calcification within it. On physical exam, no  mass is palpated in the right breast. Ultrasound is performed, showing two adjacent cysts at 2 o'clock 5 cm from the right nipple measuring 5 x 3 x 4 mm in overall size. IMPRESSION: Right breast cysts.  No mammographic or sonographic evidence of malignancy. RECOMMENDATION: Yearly screening mammography is suggested. BI-RADS CATEGORY 2:  Benign finding(s). Original Report Authenticated By: Phineas Inches  [employer] R  09/02/2011  **ADDENDUM** CREATED: 09/02/2011 09:24:01 Correction to Screening Mammogram report:  The possible mass was seen in the right breast, not the left.  Correctly, the right breast received diagnostic mammography and ultrasound. **END ADDENDUM** SIGNED BY: Esperanza Heir, M.D.  08/22/2011  *RADIOLOGY REPORT* Clinical Data:  The patient returns for evaluation of a possible mass in the right breast noted on recent screening study dated 08/01/2011.  The prior screening report incorrectly stated that the abnormality was in the left breast.  The left breast is normal. The possible mass was in the right breast. DIGITAL DIAGNOSTIC RIGHT LIMITED MAMMOGRAM  AND RIGHT BREAST ULTRASOUND: Comparison:  01/08/2003 from [facility] Radiology [facility] Findings:  Additional views confirm the presence of a circumscribed oval nodule in the  medial aspect of the right breast posteriorly. This has a dependent calcification within it. On physical exam, no mass is palpated in the right breast. Ultrasound is performed, showing two adjacent cysts at 2 o'clock 5 cm from the right nipple measuring 5 x 3 x 4 mm in overall size. IMPRESSION: Right breast cysts.  No mammographic or sonographic evidence of malignancy. RECOMMENDATION: Yearly screening mammography is suggested. BI-RADS CATEGORY 2:  Benign finding(s). Original Report Authenticated By: [email or web address]   Assessment & Plan:   Mary [name] was seen today for fatigue.  Diagnoses and all orders for this visit:  Altered level of consciousness- this is most likely  related to her narcotic medications, I cannot rule out an infectious or metabolic cause, her husband will take her to the emergency room for urgent evaluation, I'm concerned she may need an admission and detox from her narcotic medications.  Other orders -     Cancel: clonazePAM (KLONOPIN) 2 MG tablet; Take 1 tablet (2 mg total) by mouth 2 (two) times daily as needed for anxiety. -     Cancel: lisinopril (PRINIVIL,ZESTRIL) 20 MG tablet; Take 1 tablet (20 mg total) by mouth daily. -     Cancel: furosemide (LASIX) 20 MG tablet; Take 1 tablet (20 mg total) by mouth 2 (two) times daily.  I have discontinued Ms. Doscher's mometasone and modafinil. I am also having her maintain her oxyCODONE-acetaminophen, methocarbamol, clobetasol cream, morphine, lisinopril, furosemide, butalbital-acetaminophen-caffeine, clonazePAM, and lidocaine.  Meds ordered this encounter  Medications  . DISCONTD: oxyCODONE-acetaminophen (PERCOCET) 10-325 MG tablet    Sig:     Refill:  0  . lidocaine (XYLOCAINE) 2 % solution    Sig:     Refill:  0     Follow-up: Return if symptoms worsen or fail to improve.  Sanda Lingerhomas Wang [place], MD

## 2015-01-14 NOTE — Patient Instructions (Signed)
Fatigue  Fatigue is feeling tired all of the time, a lack of energy, or a lack of motivation. Occasional or mild fatigue is often a normal response to activity or life in general. However, long-lasting (chronic) or extreme fatigue may indicate an underlying medical condition.  HOME CARE INSTRUCTIONS   Watch your fatigue for any changes. The following actions may help to lessen any discomfort you are feeling:  · Talk to your health care provider about how much sleep you need each night. Try to get the required amount every night.  · Take medicines only as directed by your health care provider.  · Eat a healthy and nutritious diet. Ask your health care provider if you need help changing your diet.  · Drink enough fluid to keep your urine clear or pale yellow.  · Practice ways of relaxing, such as yoga, meditation, massage therapy, or acupuncture.  · Exercise regularly.    · Change situations that cause you stress. Try to keep your work and personal routine reasonable.  · Do not abuse illegal drugs.  · Limit alcohol intake to no more than 1 drink per day for nonpregnant women and 2 drinks per day for men. One drink equals 12 ounces of beer, 5 ounces of wine, or 1½ ounces of hard liquor.  · Take a multivitamin, if directed by your health care provider.  SEEK MEDICAL CARE IF:   · Your fatigue does not get better.  · You have a fever.    · You have unintentional weight loss or gain.  · You have headaches.    · You have difficulty:      Falling asleep.    Sleeping throughout the night.  · You feel angry, guilty, anxious, or sad.     · You are unable to have a bowel movement (constipation).    · You skin is dry.     · Your legs or another part of your body is swollen.    SEEK IMMEDIATE MEDICAL CARE IF:   · You feel confused.    · Your vision is blurry.  · You feel faint or pass out.    · You have a severe headache.    · You have severe abdominal, pelvic, or back pain.    · You have chest pain, shortness of breath, or an  irregular or fast heartbeat.    · You are unable to urinate or you urinate less than normal.    · You develop abnormal bleeding, such as bleeding from the rectum, vagina, nose, lungs, or nipples.  · You vomit blood.     · You have thoughts about harming yourself or committing suicide.    · You are worried that you might harm someone else.       This information is not intended to replace advice given to you by your health care provider. Make sure you discuss any questions you have with your health care provider.     Document Released: 12/12/2006 Document Revised: 03/07/2014 Document Reviewed: 06/18/2013  Elsevier Interactive Patient Education ©2016 [employer]

## 2015-01-14 NOTE — Progress Notes (Signed)
Pre visit review using our clinic review tool, if applicable. No additional management support is needed unless otherwise documented below in the visit note. 

## 2015-01-14 NOTE — ED Notes (Signed)
MD at bedside. 

## 2015-01-14 NOTE — ED Notes (Signed)
Pt c/o sore throat and intermittent R ear pain x 3 weeks and increased fatigue.  Pain score 2/10.  Hx of Sjogrens.

## 2015-01-19 ENCOUNTER — Other Ambulatory Visit: Payer: Self-pay | Admitting: Internal Medicine

## 2015-01-19 DIAGNOSIS — F419 Anxiety disorder, unspecified: Secondary | ICD-10-CM

## 2015-01-19 MED ORDER — CLONAZEPAM 1 MG PO TABS: 1.0000 mg | ORAL_TABLET | Freq: Two times a day (BID) | ORAL | Status: DC

## 2015-01-26 ENCOUNTER — Other Ambulatory Visit: Payer: Self-pay | Admitting: Internal Medicine

## 2015-05-29 ENCOUNTER — Telehealth: Payer: Self-pay

## 2015-05-29 NOTE — Telephone Encounter (Signed)
LVM for pt to call back as soon as possible.   RE: Flu Vaccine for this flu season.   

## 2015-05-29 NOTE — Telephone Encounter (Signed)
Pt stated she is not coming back to our office anymore.

## 2015-06-01 NOTE — Telephone Encounter (Signed)
Please see below.

## 2017-03-10 DIAGNOSIS — G894 Chronic pain syndrome: Secondary | ICD-10-CM | POA: Diagnosis not present

## 2017-03-10 DIAGNOSIS — Z5181 Encounter for therapeutic drug level monitoring: Secondary | ICD-10-CM | POA: Diagnosis not present

## 2017-03-10 DIAGNOSIS — M19011 Primary osteoarthritis, right shoulder: Secondary | ICD-10-CM | POA: Diagnosis not present

## 2017-03-10 DIAGNOSIS — M25511 Pain in right shoulder: Secondary | ICD-10-CM | POA: Diagnosis not present

## 2017-04-07 DIAGNOSIS — M25511 Pain in right shoulder: Secondary | ICD-10-CM | POA: Diagnosis not present

## 2017-04-07 DIAGNOSIS — G894 Chronic pain syndrome: Secondary | ICD-10-CM | POA: Diagnosis not present

## 2017-04-07 DIAGNOSIS — M898X1 Other specified disorders of bone, shoulder: Secondary | ICD-10-CM | POA: Diagnosis not present

## 2017-04-07 DIAGNOSIS — I10 Essential (primary) hypertension: Secondary | ICD-10-CM | POA: Diagnosis not present

## 2017-04-24 DIAGNOSIS — H25013 Cortical age-related cataract, bilateral: Secondary | ICD-10-CM | POA: Diagnosis not present

## 2017-04-24 DIAGNOSIS — H43813 Vitreous degeneration, bilateral: Secondary | ICD-10-CM | POA: Diagnosis not present

## 2017-04-24 DIAGNOSIS — H04123 Dry eye syndrome of bilateral lacrimal glands: Secondary | ICD-10-CM | POA: Diagnosis not present

## 2017-05-03 DIAGNOSIS — E559 Vitamin D deficiency, unspecified: Secondary | ICD-10-CM | POA: Diagnosis not present

## 2017-05-03 DIAGNOSIS — Z0000 Encounter for general adult medical examination without abnormal findings: Secondary | ICD-10-CM | POA: Diagnosis not present

## 2017-05-03 DIAGNOSIS — E7800 Pure hypercholesterolemia, unspecified: Secondary | ICD-10-CM | POA: Diagnosis not present

## 2017-05-03 DIAGNOSIS — I10 Essential (primary) hypertension: Secondary | ICD-10-CM | POA: Diagnosis not present

## 2017-05-11 DIAGNOSIS — H2511 Age-related nuclear cataract, right eye: Secondary | ICD-10-CM | POA: Diagnosis not present

## 2017-05-11 DIAGNOSIS — H25811 Combined forms of age-related cataract, right eye: Secondary | ICD-10-CM | POA: Diagnosis not present

## 2017-05-11 DIAGNOSIS — H25011 Cortical age-related cataract, right eye: Secondary | ICD-10-CM | POA: Diagnosis not present

## 2017-06-01 DIAGNOSIS — H25012 Cortical age-related cataract, left eye: Secondary | ICD-10-CM | POA: Diagnosis not present

## 2017-06-01 DIAGNOSIS — H25812 Combined forms of age-related cataract, left eye: Secondary | ICD-10-CM | POA: Diagnosis not present

## 2017-06-01 DIAGNOSIS — H2512 Age-related nuclear cataract, left eye: Secondary | ICD-10-CM | POA: Diagnosis not present

## 2017-06-02 DIAGNOSIS — M898X1 Other specified disorders of bone, shoulder: Secondary | ICD-10-CM | POA: Diagnosis not present

## 2017-06-02 DIAGNOSIS — G894 Chronic pain syndrome: Secondary | ICD-10-CM | POA: Diagnosis not present

## 2017-06-02 DIAGNOSIS — M25511 Pain in right shoulder: Secondary | ICD-10-CM | POA: Diagnosis not present

## 2017-06-02 DIAGNOSIS — I10 Essential (primary) hypertension: Secondary | ICD-10-CM | POA: Diagnosis not present

## 2017-07-27 DIAGNOSIS — M25511 Pain in right shoulder: Secondary | ICD-10-CM | POA: Diagnosis not present

## 2017-07-27 DIAGNOSIS — G894 Chronic pain syndrome: Secondary | ICD-10-CM | POA: Diagnosis not present

## 2017-07-27 DIAGNOSIS — M7918 Myalgia, other site: Secondary | ICD-10-CM | POA: Diagnosis not present

## 2017-07-27 DIAGNOSIS — M898X1 Other specified disorders of bone, shoulder: Secondary | ICD-10-CM | POA: Diagnosis not present

## 2017-10-04 DIAGNOSIS — Z79899 Other long term (current) drug therapy: Secondary | ICD-10-CM | POA: Diagnosis not present

## 2017-10-04 DIAGNOSIS — M25511 Pain in right shoulder: Secondary | ICD-10-CM | POA: Diagnosis not present

## 2017-10-04 DIAGNOSIS — Z5181 Encounter for therapeutic drug level monitoring: Secondary | ICD-10-CM | POA: Diagnosis not present

## 2017-10-04 DIAGNOSIS — G894 Chronic pain syndrome: Secondary | ICD-10-CM | POA: Diagnosis not present

## 2017-11-15 DIAGNOSIS — M67911 Unspecified disorder of synovium and tendon, right shoulder: Secondary | ICD-10-CM | POA: Diagnosis not present

## 2017-11-23 DIAGNOSIS — Z23 Encounter for immunization: Secondary | ICD-10-CM | POA: Diagnosis not present

## 2017-11-29 DIAGNOSIS — G894 Chronic pain syndrome: Secondary | ICD-10-CM | POA: Diagnosis not present

## 2017-11-29 DIAGNOSIS — M961 Postlaminectomy syndrome, not elsewhere classified: Secondary | ICD-10-CM | POA: Diagnosis not present

## 2017-11-29 DIAGNOSIS — M25511 Pain in right shoulder: Secondary | ICD-10-CM | POA: Diagnosis not present

## 2017-11-29 DIAGNOSIS — M19011 Primary osteoarthritis, right shoulder: Secondary | ICD-10-CM | POA: Diagnosis not present

## 2017-11-30 DIAGNOSIS — M24111 Other articular cartilage disorders, right shoulder: Secondary | ICD-10-CM | POA: Diagnosis not present

## 2017-11-30 DIAGNOSIS — M7581 Other shoulder lesions, right shoulder: Secondary | ICD-10-CM | POA: Diagnosis not present

## 2017-11-30 DIAGNOSIS — S46011A Strain of muscle(s) and tendon(s) of the rotator cuff of right shoulder, initial encounter: Secondary | ICD-10-CM | POA: Diagnosis not present

## 2017-11-30 DIAGNOSIS — M67813 Other specified disorders of tendon, right shoulder: Secondary | ICD-10-CM | POA: Diagnosis not present

## 2017-12-01 DIAGNOSIS — M24811 Other specific joint derangements of right shoulder, not elsewhere classified: Secondary | ICD-10-CM | POA: Diagnosis not present

## 2017-12-01 DIAGNOSIS — M75121 Complete rotator cuff tear or rupture of right shoulder, not specified as traumatic: Secondary | ICD-10-CM | POA: Diagnosis not present

## 2018-01-29 DIAGNOSIS — G894 Chronic pain syndrome: Secondary | ICD-10-CM | POA: Diagnosis not present

## 2018-01-29 DIAGNOSIS — M961 Postlaminectomy syndrome, not elsewhere classified: Secondary | ICD-10-CM | POA: Diagnosis not present

## 2018-01-29 DIAGNOSIS — M25511 Pain in right shoulder: Secondary | ICD-10-CM | POA: Diagnosis not present

## 2018-01-29 DIAGNOSIS — M19011 Primary osteoarthritis, right shoulder: Secondary | ICD-10-CM | POA: Diagnosis not present

## 2018-02-28 HISTORY — PX: SHOULDER SURGERY: SHX246

## 2018-03-05 DIAGNOSIS — M7521 Bicipital tendinitis, right shoulder: Secondary | ICD-10-CM | POA: Diagnosis not present

## 2018-03-05 DIAGNOSIS — M75111 Incomplete rotator cuff tear or rupture of right shoulder, not specified as traumatic: Secondary | ICD-10-CM | POA: Diagnosis not present

## 2018-03-05 DIAGNOSIS — M19011 Primary osteoarthritis, right shoulder: Secondary | ICD-10-CM | POA: Diagnosis not present

## 2018-03-05 DIAGNOSIS — M7541 Impingement syndrome of right shoulder: Secondary | ICD-10-CM | POA: Diagnosis not present

## 2018-03-05 DIAGNOSIS — G8918 Other acute postprocedural pain: Secondary | ICD-10-CM | POA: Diagnosis not present

## 2018-03-14 DIAGNOSIS — M1711 Unilateral primary osteoarthritis, right knee: Secondary | ICD-10-CM | POA: Diagnosis not present

## 2018-03-14 DIAGNOSIS — Z9889 Other specified postprocedural states: Secondary | ICD-10-CM | POA: Diagnosis not present

## 2018-03-26 DIAGNOSIS — G894 Chronic pain syndrome: Secondary | ICD-10-CM | POA: Diagnosis not present

## 2018-03-26 DIAGNOSIS — M25511 Pain in right shoulder: Secondary | ICD-10-CM | POA: Diagnosis not present

## 2018-03-26 DIAGNOSIS — M19011 Primary osteoarthritis, right shoulder: Secondary | ICD-10-CM | POA: Diagnosis not present

## 2018-03-26 DIAGNOSIS — M961 Postlaminectomy syndrome, not elsewhere classified: Secondary | ICD-10-CM | POA: Diagnosis not present

## 2018-05-03 DIAGNOSIS — G479 Sleep disorder, unspecified: Secondary | ICD-10-CM | POA: Diagnosis not present

## 2018-05-03 DIAGNOSIS — I10 Essential (primary) hypertension: Secondary | ICD-10-CM | POA: Diagnosis not present

## 2018-05-03 DIAGNOSIS — H6980 Other specified disorders of Eustachian tube, unspecified ear: Secondary | ICD-10-CM | POA: Diagnosis not present

## 2018-06-22 DIAGNOSIS — M961 Postlaminectomy syndrome, not elsewhere classified: Secondary | ICD-10-CM | POA: Diagnosis not present

## 2018-06-22 DIAGNOSIS — M25511 Pain in right shoulder: Secondary | ICD-10-CM | POA: Diagnosis not present

## 2018-06-22 DIAGNOSIS — M19011 Primary osteoarthritis, right shoulder: Secondary | ICD-10-CM | POA: Diagnosis not present

## 2018-06-22 DIAGNOSIS — G894 Chronic pain syndrome: Secondary | ICD-10-CM | POA: Diagnosis not present

## 2018-06-27 DIAGNOSIS — F419 Anxiety disorder, unspecified: Secondary | ICD-10-CM | POA: Diagnosis not present

## 2018-06-27 DIAGNOSIS — Z0000 Encounter for general adult medical examination without abnormal findings: Secondary | ICD-10-CM | POA: Diagnosis not present

## 2018-06-27 DIAGNOSIS — I10 Essential (primary) hypertension: Secondary | ICD-10-CM | POA: Diagnosis not present

## 2018-06-27 DIAGNOSIS — G479 Sleep disorder, unspecified: Secondary | ICD-10-CM | POA: Diagnosis not present

## 2018-07-24 DIAGNOSIS — G894 Chronic pain syndrome: Secondary | ICD-10-CM | POA: Diagnosis not present

## 2018-07-24 DIAGNOSIS — M961 Postlaminectomy syndrome, not elsewhere classified: Secondary | ICD-10-CM | POA: Diagnosis not present

## 2018-07-24 DIAGNOSIS — M25511 Pain in right shoulder: Secondary | ICD-10-CM | POA: Diagnosis not present

## 2018-07-24 DIAGNOSIS — M19011 Primary osteoarthritis, right shoulder: Secondary | ICD-10-CM | POA: Diagnosis not present

## 2018-09-07 DIAGNOSIS — M961 Postlaminectomy syndrome, not elsewhere classified: Secondary | ICD-10-CM | POA: Diagnosis not present

## 2018-09-07 DIAGNOSIS — M25511 Pain in right shoulder: Secondary | ICD-10-CM | POA: Diagnosis not present

## 2018-09-07 DIAGNOSIS — M19011 Primary osteoarthritis, right shoulder: Secondary | ICD-10-CM | POA: Diagnosis not present

## 2018-09-07 DIAGNOSIS — G894 Chronic pain syndrome: Secondary | ICD-10-CM | POA: Diagnosis not present

## 2018-10-05 DIAGNOSIS — G894 Chronic pain syndrome: Secondary | ICD-10-CM | POA: Diagnosis not present

## 2018-10-05 DIAGNOSIS — M25511 Pain in right shoulder: Secondary | ICD-10-CM | POA: Diagnosis not present

## 2018-10-05 DIAGNOSIS — M19011 Primary osteoarthritis, right shoulder: Secondary | ICD-10-CM | POA: Diagnosis not present

## 2018-10-05 DIAGNOSIS — M961 Postlaminectomy syndrome, not elsewhere classified: Secondary | ICD-10-CM | POA: Diagnosis not present

## 2018-10-25 DIAGNOSIS — H6980 Other specified disorders of Eustachian tube, unspecified ear: Secondary | ICD-10-CM | POA: Diagnosis not present

## 2018-10-25 DIAGNOSIS — R2689 Other abnormalities of gait and mobility: Secondary | ICD-10-CM | POA: Diagnosis not present

## 2018-10-31 DIAGNOSIS — H9313 Tinnitus, bilateral: Secondary | ICD-10-CM | POA: Diagnosis not present

## 2018-10-31 DIAGNOSIS — R42 Dizziness and giddiness: Secondary | ICD-10-CM | POA: Diagnosis not present

## 2018-10-31 DIAGNOSIS — H6983 Other specified disorders of Eustachian tube, bilateral: Secondary | ICD-10-CM | POA: Diagnosis not present

## 2018-10-31 DIAGNOSIS — H903 Sensorineural hearing loss, bilateral: Secondary | ICD-10-CM | POA: Diagnosis not present

## 2018-11-09 DIAGNOSIS — R42 Dizziness and giddiness: Secondary | ICD-10-CM | POA: Diagnosis not present

## 2018-11-09 DIAGNOSIS — H903 Sensorineural hearing loss, bilateral: Secondary | ICD-10-CM | POA: Diagnosis not present

## 2018-11-21 DIAGNOSIS — R42 Dizziness and giddiness: Secondary | ICD-10-CM | POA: Diagnosis not present

## 2018-11-28 ENCOUNTER — Encounter: Payer: Self-pay | Admitting: Neurology

## 2018-11-28 ENCOUNTER — Other Ambulatory Visit: Payer: Self-pay

## 2018-11-28 ENCOUNTER — Ambulatory Visit: Payer: Medicare Other | Admitting: Neurology

## 2018-11-28 VITALS — BP 168/63 | HR 57 | Temp 96.0°F | Ht 63.0 in | Wt 142.0 lb

## 2018-11-28 DIAGNOSIS — G3281 Cerebellar ataxia in diseases classified elsewhere: Secondary | ICD-10-CM | POA: Diagnosis not present

## 2018-11-28 DIAGNOSIS — H539 Unspecified visual disturbance: Secondary | ICD-10-CM

## 2018-11-28 DIAGNOSIS — R42 Dizziness and giddiness: Secondary | ICD-10-CM | POA: Diagnosis not present

## 2018-11-28 DIAGNOSIS — H81312 Aural vertigo, left ear: Secondary | ICD-10-CM

## 2018-11-28 DIAGNOSIS — G894 Chronic pain syndrome: Secondary | ICD-10-CM

## 2018-11-28 DIAGNOSIS — I639 Cerebral infarction, unspecified: Secondary | ICD-10-CM

## 2018-11-28 DIAGNOSIS — G43619 Persistent migraine aura with cerebral infarction, intractable, without status migrainosus: Secondary | ICD-10-CM | POA: Insufficient documentation

## 2018-11-28 DIAGNOSIS — R270 Ataxia, unspecified: Secondary | ICD-10-CM

## 2018-11-28 DIAGNOSIS — I10 Essential (primary) hypertension: Secondary | ICD-10-CM

## 2018-11-28 MED ORDER — ALPRAZOLAM 1 MG PO TABS: ORAL_TABLET | ORAL | 0 refills | Status: DC

## 2018-11-28 NOTE — Patient Instructions (Signed)
Ataxia  Ataxia is a condition that causes unsteadiness when walking and standing, poor coordination of body movements, and difficulty keeping a straight (upright) posture. It occurs because of a problem with the part of the brain that controls coordination and stability (cerebellum). Ataxia can develop later in life (acquired ataxia), during your 20s or 30s or even into your 60s or later. This type of ataxia develops when another medical condition, such as a stroke, damages the cerebellum. Ataxia also may be present early in life (non-acquired ataxia). There are two main types of non-acquired ataxia:  Congenital. This type is present at birth.  Hereditary. This type is passed from parent to child. The most common form of hereditary non-acquired ataxia is Friedreich ataxia. What are the causes? Acquired ataxia may be caused by:  Changes in the nervous system (neurodegenerative changes).  Changes throughout the body (systemic disorders).  A lot of exposure to: ? Certain medicines such as phenytoin and lithium. ? Solvents. These are cleaning fluids such as paint thinner, nail polish remover, carpet cleaner, and degreasers.  Alcohol abuse (alcoholism).  Medical conditions, such as: ? Celiac disease. ? Hypothyroidism. ? A lack (deficiency) of vitamin E, vitamin B12, or thiamine. ? Brain tumors. ? Multiple sclerosis. ? Cerebral palsy. ? Stroke. ? Paraneoplastic syndromes. ? Viral infections. ? Head injury. ? Malnutrition. Congenital and hereditary ataxia are caused by problems that are present in genes before birth. What are the signs or symptoms? Signs and symptoms of ataxia vary depending on the cause. They may include:  Being unsteady.  Walking with the legs wide apart (wide stance) to keep one's balance.  Uncontrolled shaking (tremor).  Poorly coordinated body movements.  Difficulty maintaining an upright posture.  Fatigue.  Changes in speech.  Changes in vision.   Involuntary eye movements (nystagmus).  Difficulty swallowing.  Difficulty writing.  Muscle tightening that you cannot control (muscle spasms). How is this diagnosed? Ataxia may be diagnosed based on:  Your personal and family medical history.  A physical exam.  Imaging tests, such as a CT scan or MRI.  Spinal tap (lumbar puncture). This procedure involves using a needle to take a sample of the fluid around your brain and spinal cord.  Genetic testing. How is this treated? The underlying condition that causes your ataxia needs to be treated. If the cause is a brain tumor, you may need surgery. Treatment also focuses on helping you live with ataxia and improving your quality of life (supportive treatments). This may involve:  Learning ways to improve coordination and move around more carefully (physical therapy).  Learning ways to improve your ability to do daily tasks, such as bathing and feeding yourself (occupational therapy).  Using devices to help you move around, eat, or communicate (assistive devices), such as a walker, modified eating utensils, and communication aids.  Learning ways to improve speech and swallowing (speech therapy). Follow these instructions at home: Preventing falls  Lie down right away if you become very unsteady, dizzy, or nauseous, or if you feel like you are going to faint. Do not get up until all of those feelings pass.  Keep your home well-lit. Use night-lights as needed.  Remove tripping hazards, such as rugs, cords, and clutter.  Install grab bars by the toilet and in the tub and shower.  Use assistive devices such as a cane, walker, or wheelchair as needed to keep your balance. General instructions  Do not drink alcohol.  Ask your health care provider what activities are safe   for you, and what activities you should avoid.  Take over-the-counter and prescription medicines only as told by your health care provider. Get help right away  if you:  Have unsteadiness that suddenly worsens.  Have any of these: ? Severe headaches. ? Chest pain. ? Abdominal pain. ? Weakness or numbness on one side of your body. ? Vision problems. ? Difficulty speaking. ? An irregular heartbeat. ? A very fast pulse.  Feel confused. Summary  Ataxia is a condition that causes unsteadiness when walking and standing, poor coordination of body movements, and difficulty keeping a straight (upright) posture.  Ataxia occurs because of a problem with the part of the brain that controls coordination and stability (cerebellum).  The underlying condition that causes your ataxia needs to be treated. Treatment also focuses on helping you live with ataxia and improving your quality of life (supportive treatments).  Lie down right away if you become very unsteady, dizzy, or nauseous, or if you feel like you are going to faint. This information is not intended to replace advice given to you by your health care provider. Make sure you discuss any questions you have with your health care provider. Document Released: 09/11/2013 Document Revised: 01/27/2017 Document Reviewed: 12/16/2016 Elsevier Patient Education  2020 [employer]  

## 2018-11-28 NOTE — Progress Notes (Signed)
Provider:  Melvyn Novas, M D  Referring Provider: Daisy Floro, MD Primary Care Physician:  Daisy Floro, MD  Chief Complaint  Patient presents with   New Patient (Initial Visit)    pt with husband, rm 10. pt states that she developed mild dizzy/off balance with certain. she travelled 2 mths ago and she stayed a wk and she started to vomit while in the mountains roughly 10 hrs after arrival. when coming home still has dizziness and balance and positional movement concerns. she visited mountains again mth ago. 6 hrs upon arrival she devleoped vomitting and it continued the entire 3 wks and then when she got home she had some nausea but no vomitting once home.     HPI:  Mary [name] is a 68 y.o. female  seen here on 11-28-2018  upon a referral/ revisit  from Dr. Suszanne Conners , who evaluated for vestibular dysfunction.   Mrs. Jon Billings had a very interesting initial onset of her current symptoms.  She has 4 years struggled with some frequent migraines with aura she has the typical photo and phonophobia and nausea associated with it.  Sometimes , not always , a headache developed without aura.  About 2 months ago the couple drove to the mountains and after arrival at the destination within about 10 hours later patient developed nausea and had to vomit multiple times she had to leave the mountains to get better came back home for about a week and started to feel better but when she tried to go back to the mountains within 6 hours she was vomiting again.  This time she stayed for 3 weeks but never got much better.  [place] also cause her to get travel sick.  She denies ice cream headaches, she struggles now ongoing this lightheadedness, vertigo, nausea the background intensity of the symptoms is about 1 out of 10.  She has noted some interesting changes however that may have preceded those of the acute vertigo onset.  She states that when she looks at straight spaced horizontal lines she  noticed at the middle or center of the visual field allows for the lines to dip as if a v is formed. She was able to re-create this effect by looking at a timer from here in our office the air sanitizer unit does have a filter.  She has covered both eyes individually and still saw the dip in the line.  Her oral is usually a dark spot in the center of her vision the scotoma surrounded by blinking flashing lights.  Upon arrival here today I was also able to release Dr. Duard Larsen father detailed note.  Vestibular assessment was abnormal and showed central and peripheral indicators.  This test has been performed on 11 September.  Mother was a normal result for the right ear there was an abnormal finding on the left attributed to dysfunction of the saccule or inferior vestibular nerve in the left ear.  There is also an abnormal ocular motility test stated this led to the assumption that there will be an imbalance of vestibular inputs.  Abnormal tracking of the left eye was described.  She was referred for vestibular rehabilitation and for neurology work-up.  In addition I would like to add that the patient has high blood pressures at baseline when she takes her blood pressure at home it is running usually around 140 mmHg over 70 here today we did orthostatics showing that the supine blood pressure was  166/63 mmHg heart rate 58.  Seated blood pressure was 156/68 with a heart rate of 56 described as regular.  Mrs. movement and dizziness was described.  The third measurement was standing 168 mmHg over 63 with a heart rate of 57 bpm, regular there was minimal feeling of dizziness upon standing the key seems to be resuming a seated position from a supine position. .  Review of Systems: Out of a complete 14 system review, the patient complains of only the following symptoms, and all other reviewed systems are negative.  Migraine , complicated,  Ocular / vestibular vestibular dysfunction. HTN/ left eye pain.   Social  History   Socioeconomic History   Marital status: Married    Spouse name: Not on file   Number of children: Not on file   Years of education: Not on file   Highest education level: Not on file  Occupational History   Not on file  Social Needs   Financial resource strain: Not on file   Food insecurity    Worry: Not on file    Inability: Not on file   Transportation needs    Medical: Not on file    Non-medical: Not on file  Tobacco Use   Smoking status: Former Smoker   Smokeless tobacco: Never Used  Substance and Sexual Activity   Alcohol use: No   Drug use: No   Sexual activity: Not Currently    Birth control/protection: Surgical  Lifestyle   Physical activity    Days per week: Not on file    Minutes per session: Not on file   Stress: Not on file  Relationships   Social connections    Talks on phone: Not on file    Gets together: Not on file    Attends religious service: Not on file    Active member of club or organization: Not on file    Attends meetings of clubs or organizations: Not on file    Relationship status: Not on file   Intimate partner violence    Fear of current or ex partner: Not on file    Emotionally abused: Not on file    Physically abused: Not on file    Forced sexual activity: Not on file  Other Topics Concern   Not on file  Social History Narrative   Not on file    Family History  Problem Relation Age of Onset   Cancer Neg Hx    Early death Neg Hx    Heart disease Neg Hx    Hyperlipidemia Neg Hx    Hypertension Neg Hx    Stroke Neg Hx     Past Medical History:  Diagnosis Date   Allergy    Arthritis    Asthma    Chronic kidney disease    Diabetes mellitus    GERD (gastroesophageal reflux disease)    Headache(784.0)    High cholesterol    Hypertension    Migraines    Sjogrens syndrome (HCC)    Thyroid disease     Past Surgical History:  Procedure Laterality Date   ABDOMINAL  HYSTERECTOMY  1998   ANTERIOR CERVICAL DECOMP/DISCECTOMY FUSION  2011   APPENDECTOMY     BREAST BIOPSY  1971   KIDNEY STONE SURGERY  2011   LUMBAR FUSION  2005   TONSILLECTOMY  1971    Current Outpatient Medications  Medication Sig Dispense Refill   butalbital-acetaminophen-caffeine (FIORICET, ESGIC) 50-325-40 MG tablet Take 1 tablet by mouth every 6 (  six) hours as needed for headache. 90 tablet 1   cholecalciferol (VITAMIN D3) 25 MCG (1000 UT) tablet Take 1,000 Units by mouth daily.     fluticasone (FLONASE) 50 MCG/ACT nasal spray Place 1 spray into both nostrils 2 (two) times daily.     furosemide (LASIX) 20 MG tablet TAKE 1 BY MOUTH TWICE DAILY 180 tablet 0   hydrOXYzine (ATARAX/VISTARIL) 25 MG tablet Take 25 mg by mouth every 8 (eight) hours as needed.     lidocaine (XYLOCAINE) 2 % solution Use as directed 20 mLs in the mouth or throat every 6 (six) hours as needed for mouth pain.   0   lisinopril (ZESTRIL) 40 MG tablet      Melatonin 3 MG TABS Take 1 tablet by mouth at bedtime as needed.     methocarbamol (ROBAXIN) 500 MG tablet Take 2 tabs four times daily     morphine (MS CONTIN) 30 MG 12 hr tablet Take 30 mg by mouth every 8 (eight) hours.      Multiple Vitamin (MULTIVITAMIN) tablet Take 1 tablet by mouth daily.     oxyCODONE-acetaminophen (PERCOCET) 7.5-325 MG per tablet Take 1 tablet by mouth 3 (three) times daily as needed for severe pain.      Polyethyl Glycol-Propyl Glycol (SYSTANE) 0.4-0.3 % SOLN Apply 1 drop to eye 3 times/day as needed-between meals & bedtime.     zolpidem (AMBIEN CR) 6.25 MG CR tablet Take 6.25 mg by mouth at bedtime as needed for sleep.     No current facility-administered medications for this visit.     Allergies as of 11/28/2018 - Review Complete 11/28/2018  Allergen Reaction Noted   Sulfa antibiotics Nausea And Vomiting 01/14/2015   Adhesive [tape]  09/21/2011   Celebrex [celecoxib]  06/30/2011   Lyrica [pregabalin]   06/30/2011   Macrobid [nitrofurantoin macrocrystal]  06/30/2011   Penicillins  06/30/2011   Tribenzor [olmesartan-amlodipine-hctz]  08/11/2011   Tricor [fenofibrate]  06/30/2011   Vioxx [rofecoxib]  06/30/2011   Cephalexin  01/14/2015    Vitals: BP (!) 168/63 (BP Location: Left Arm, Patient Position: Standing)    Pulse (!) 57    Temp (!) 96 F (35.6 C)    Ht 5\' 3"  (1.6 m)    Wt 142 lb (64.4 kg)    BMI 25.15 kg/m  Last Weight:  Wt Readings from Last 1 Encounters:  11/28/18 142 lb (64.4 kg)   Last Height:   Ht Readings from Last 1 Encounters:  11/28/18 5\' 3"  (1.6 m)    Physical exam:  General: The patient is awake, alert and appears not in acute distress.  The patient is well groomed. Head: Normocephalic, atraumatic. Neck is supple. Mallampati slight titubation noted (! ) , Cardiovascular:  Regular rate and rhythm , without  murmurs or carotid bruit, and without distended neck veins. Respiratory: Lungs are clear to auscultation. Skin:  Without evidence of edema, or rash Trunk: BMI is 25 and patient  has normal posture. She reports drifting to the left.  Neurologic exam : The patient is awake and alert, oriented to place and time.   Memory subjective described as intact. There is a normal attention span & concentration ability. Speech is fluent without   dysarthria, dysphonia or aphasia. Mood and affect are slightly anxious.  Cranial nerves: Pupils are equal and briskly reactive to light.  Funduscopic exam deferred,  Extraocular movements  in vertical and horizontal planes intact and without nystagmus.  Visual fields by finger perimetry are intact.  Hearing to finger rub intact.   Facial sensation intact to fine touch. Facial motor strength is symmetric and tongue and uvula move midline. Tongue protrusion into either cheek is normal. Shoulder shrug is normal.  She reports neck pain with passive head movements- raid shaking did not show any nystagmus, deviation or iplopia.    Motor exam:  Normal tone ,muscle bulk and symmetric strength in all extremities. She has a weak grip strength -bilaterally the same.   Sensory:  Fine touch, pinprick and vibration were tested in all extremities.  Proprioception was normal.  Coordination: Rapid alternating movements in the fingers/hands were normal. Finger-to-nose maneuver  normal without evidence of ataxia, dysmetria or tremor. While her other hand and arm were strecthed in maneuver , I noted tremor.   Gait and station: Patient walks without assistive device and is able unassisted to climb up to the exam table. Strength within normal limits. Stance is stable and normal.  Turning with 4 steps, more fragmented when turning toward the left, had to stretch her hands out, to touch the wall.  Tandem gait is ataxic, she almost fell, seems to drift to the left. Romberg testing is *negative .  she described " sea legs' disembarkment syndrome.   Deep tendon reflexes: in the upper and lower extremities are symmetric and intact. Brisk on the patella - Babinski maneuver response is  downgoing.   Assessment:  After physical and neurologic examination, review of laboratory studies, imaging, neurophysiology testing and pre-existing records, assessment is that of :   Here normal eye movements but apparently stable aura, this time without migraine to follow.   Ongoing feeling as if on heavy seas, boat rocking under her.   Vestibular test lateralized to the left eye, left ear.   Ataxia.   Plan:  Treatment plan and additional workup :  Order for bran MRI , cervical spine. with and without. Xanax will help.   Rule out vascular event  - could explain migraine, ataxia and nausea.   Vestibular rehab.   Trial of effexor or zoloft-  These are known to help with disembark sensation.  Patient declined.    Rv with NP after MRIs and vestibular rehab.     Porfirio Mylar Badr Piedra MD 11/28/2018

## 2018-11-28 NOTE — Addendum Note (Signed)
Addended by: Larey Seat on: 11/28/2018 03:17 PM   Modules accepted: Orders

## 2018-11-29 LAB — COMPREHENSIVE METABOLIC PANEL
ALT: 9 IU/L (ref 0–32)
AST: 19 IU/L (ref 0–40)
Albumin/Globulin Ratio: 1.8 (ref 1.2–2.2)
Albumin: 4.7 g/dL (ref 3.8–4.8)
Alkaline Phosphatase: 98 IU/L (ref 39–117)
BUN/Creatinine Ratio: 15 (ref 12–28)
BUN: 17 mg/dL (ref 8–27)
Bilirubin Total: <0.2 mg/dL (ref 0.0–1.2)
CO2: 26 mmol/L (ref 20–29)
Calcium: 10 mg/dL (ref 8.7–10.3)
Chloride: 99 mmol/L (ref 96–106)
Creatinine, Ser: 1.13 mg/dL — ABNORMAL HIGH (ref 0.57–1.00)
GFR calc Af Amer: 59 mL/min/{1.73_m2} — ABNORMAL LOW (ref >59)
GFR calc non Af Amer: 51 mL/min/{1.73_m2} — ABNORMAL LOW (ref >59)
Globulin, Total: 2.6 g/dL (ref 1.5–4.5)
Glucose: 102 mg/dL — ABNORMAL HIGH (ref 65–99)
Potassium: 4.6 mmol/L (ref 3.5–5.2)
Sodium: 141 mmol/L (ref 134–144)
Total Protein: 7.3 g/dL (ref 6.0–8.5)

## 2018-11-30 DIAGNOSIS — M961 Postlaminectomy syndrome, not elsewhere classified: Secondary | ICD-10-CM | POA: Diagnosis not present

## 2018-11-30 DIAGNOSIS — M25511 Pain in right shoulder: Secondary | ICD-10-CM | POA: Diagnosis not present

## 2018-11-30 DIAGNOSIS — M19011 Primary osteoarthritis, right shoulder: Secondary | ICD-10-CM | POA: Diagnosis not present

## 2018-11-30 DIAGNOSIS — G894 Chronic pain syndrome: Secondary | ICD-10-CM | POA: Diagnosis not present

## 2018-12-03 ENCOUNTER — Encounter: Payer: Self-pay | Admitting: Neurology

## 2018-12-03 ENCOUNTER — Telehealth: Payer: Self-pay | Admitting: Neurology

## 2018-12-03 NOTE — Telephone Encounter (Signed)
Melissa @ [facility] is asking for a call from Raquel Sarna to discuss the PA on the 2 MRI's.  Please call

## 2018-12-03 NOTE — Telephone Encounter (Signed)
[employer] pending faxed notes.

## 2018-12-04 NOTE — Telephone Encounter (Signed)
Error

## 2018-12-05 DIAGNOSIS — R42 Dizziness and giddiness: Secondary | ICD-10-CM | POA: Diagnosis not present

## 2018-12-05 DIAGNOSIS — R2689 Other abnormalities of gait and mobility: Secondary | ICD-10-CM | POA: Diagnosis not present

## 2018-12-06 ENCOUNTER — Telehealth: Payer: Self-pay | Admitting: Neurology

## 2018-12-06 NOTE — Telephone Encounter (Signed)
I spoke to a nurse at [facility] they are not able to approve the MRI Cervical spine. There is an option to do a peer to peer. The phone number is [phone]. The member ID is [id number] & DOB 1950-08-12. The peer to peer would have to be done by Wednesday 12/12/18.  The MRI Brain did get approved. Please let me know if you want to withdraw the Cervical and the patient just proceed on having the MRI Brain?

## 2018-12-06 NOTE — Telephone Encounter (Signed)
I have called the pt and advised of the insurance denying the MRI cervical spine for now. Informed that Dr Brett Fairy is ok with completing the MRI of the brain. Patient was upset and stated she would reach out to the insurance to find out why but informed her that Dr Brett Fairy didn't feel to concerned with needing to push the cervical spine. Advised to drink plenty of fluids prior to MRI of the brain. Pt verbalized understanding.

## 2018-12-06 NOTE — Telephone Encounter (Signed)
Lets do only brain. CD

## 2018-12-06 NOTE — Telephone Encounter (Signed)
Noted the Brain Auth: 12/06/18 [employer] medicare auth: [id number] (exp. 12/03/18 to 05/31/19) order sent to [facility]. They will reach out to the patient to schedule.

## 2018-12-06 NOTE — Telephone Encounter (Signed)
Called the patient and advised her of the lab work being completed. Encouraged the patient to make sure to drink plenty of water prior to the study. Advised that insurance is denying the MRI cervical spine at this time. Dr Brett Fairy states that she is ok with just moving forward with MRI of the brain.

## 2018-12-06 NOTE — Telephone Encounter (Signed)
-----   Message from Larey Seat, MD sent at 11/29/2018 12:29 PM EDT ----- Slight elevation in creatinine without signs of dehydration ( BUN was 17) . Normal liver function, no anemia, no electrolyte abnormality

## 2018-12-07 DIAGNOSIS — I10 Essential (primary) hypertension: Secondary | ICD-10-CM | POA: Diagnosis not present

## 2018-12-07 DIAGNOSIS — R112 Nausea with vomiting, unspecified: Secondary | ICD-10-CM | POA: Diagnosis not present

## 2018-12-12 ENCOUNTER — Encounter (HOSPITAL_COMMUNITY): Payer: Self-pay

## 2018-12-12 ENCOUNTER — Inpatient Hospital Stay (HOSPITAL_COMMUNITY)
Admission: EM | Admit: 2018-12-12 | Discharge: 2018-12-15 | DRG: 660 | Disposition: A | Discharge status: Home / Self Care | Payer: Medicare Other | Attending: Internal Medicine | Admitting: Internal Medicine

## 2018-12-12 ENCOUNTER — Other Ambulatory Visit: Payer: Self-pay

## 2018-12-12 DIAGNOSIS — J90 Pleural effusion, not elsewhere classified: Secondary | ICD-10-CM | POA: Diagnosis not present

## 2018-12-12 DIAGNOSIS — N202 Calculus of kidney with calculus of ureter: Secondary | ICD-10-CM | POA: Diagnosis present

## 2018-12-12 DIAGNOSIS — K838 Other specified diseases of biliary tract: Secondary | ICD-10-CM | POA: Diagnosis not present

## 2018-12-12 DIAGNOSIS — M3500 Sicca syndrome, unspecified: Secondary | ICD-10-CM | POA: Diagnosis present

## 2018-12-12 DIAGNOSIS — Z87891 Personal history of nicotine dependence: Secondary | ICD-10-CM

## 2018-12-12 DIAGNOSIS — N136 Pyonephrosis: Secondary | ICD-10-CM | POA: Diagnosis not present

## 2018-12-12 DIAGNOSIS — D631 Anemia in chronic kidney disease: Secondary | ICD-10-CM | POA: Diagnosis present

## 2018-12-12 DIAGNOSIS — F419 Anxiety disorder, unspecified: Secondary | ICD-10-CM | POA: Diagnosis present

## 2018-12-12 DIAGNOSIS — N179 Acute kidney failure, unspecified: Secondary | ICD-10-CM | POA: Diagnosis not present

## 2018-12-12 DIAGNOSIS — I10 Essential (primary) hypertension: Secondary | ICD-10-CM | POA: Diagnosis not present

## 2018-12-12 DIAGNOSIS — N201 Calculus of ureter: Secondary | ICD-10-CM

## 2018-12-12 DIAGNOSIS — E1122 Type 2 diabetes mellitus with diabetic chronic kidney disease: Secondary | ICD-10-CM | POA: Diagnosis not present

## 2018-12-12 DIAGNOSIS — R2681 Unsteadiness on feet: Secondary | ICD-10-CM | POA: Diagnosis present

## 2018-12-12 DIAGNOSIS — R17 Unspecified jaundice: Secondary | ICD-10-CM | POA: Diagnosis present

## 2018-12-12 DIAGNOSIS — K219 Gastro-esophageal reflux disease without esophagitis: Secondary | ICD-10-CM | POA: Diagnosis not present

## 2018-12-12 DIAGNOSIS — Z888 Allergy status to other drugs, medicaments and biological substances status: Secondary | ICD-10-CM

## 2018-12-12 DIAGNOSIS — Z880 Allergy status to penicillin: Secondary | ICD-10-CM

## 2018-12-12 DIAGNOSIS — N1831 Chronic kidney disease, stage 3a: Secondary | ICD-10-CM | POA: Diagnosis present

## 2018-12-12 DIAGNOSIS — J45909 Unspecified asthma, uncomplicated: Secondary | ICD-10-CM | POA: Diagnosis not present

## 2018-12-12 DIAGNOSIS — E860 Dehydration: Secondary | ICD-10-CM | POA: Diagnosis not present

## 2018-12-12 DIAGNOSIS — Z882 Allergy status to sulfonamides status: Secondary | ICD-10-CM

## 2018-12-12 DIAGNOSIS — K8050 Calculus of bile duct without cholangitis or cholecystitis without obstruction: Secondary | ICD-10-CM | POA: Diagnosis present

## 2018-12-12 DIAGNOSIS — K5900 Constipation, unspecified: Secondary | ICD-10-CM | POA: Diagnosis present

## 2018-12-12 DIAGNOSIS — R112 Nausea with vomiting, unspecified: Secondary | ICD-10-CM

## 2018-12-12 DIAGNOSIS — N183 Chronic kidney disease, stage 3 unspecified: Secondary | ICD-10-CM | POA: Diagnosis present

## 2018-12-12 DIAGNOSIS — Z79899 Other long term (current) drug therapy: Secondary | ICD-10-CM | POA: Diagnosis not present

## 2018-12-12 DIAGNOSIS — K381 Appendicular concretions: Secondary | ICD-10-CM | POA: Diagnosis not present

## 2018-12-12 DIAGNOSIS — G43909 Migraine, unspecified, not intractable, without status migrainosus: Secondary | ICD-10-CM | POA: Diagnosis present

## 2018-12-12 DIAGNOSIS — R197 Diarrhea, unspecified: Secondary | ICD-10-CM | POA: Diagnosis present

## 2018-12-12 DIAGNOSIS — N130 Hydronephrosis with ureteropelvic junction obstruction: Secondary | ICD-10-CM | POA: Diagnosis not present

## 2018-12-12 DIAGNOSIS — Z981 Arthrodesis status: Secondary | ICD-10-CM | POA: Diagnosis not present

## 2018-12-12 DIAGNOSIS — G894 Chronic pain syndrome: Secondary | ICD-10-CM | POA: Diagnosis present

## 2018-12-12 DIAGNOSIS — E875 Hyperkalemia: Secondary | ICD-10-CM | POA: Diagnosis not present

## 2018-12-12 DIAGNOSIS — Z79891 Long term (current) use of opiate analgesic: Secondary | ICD-10-CM | POA: Diagnosis not present

## 2018-12-12 DIAGNOSIS — N261 Atrophy of kidney (terminal): Secondary | ICD-10-CM | POA: Diagnosis not present

## 2018-12-12 DIAGNOSIS — B952 Enterococcus as the cause of diseases classified elsewhere: Secondary | ICD-10-CM | POA: Diagnosis present

## 2018-12-12 DIAGNOSIS — N178 Other acute kidney failure: Secondary | ICD-10-CM | POA: Diagnosis not present

## 2018-12-12 DIAGNOSIS — Z20828 Contact with and (suspected) exposure to other viral communicable diseases: Secondary | ICD-10-CM | POA: Diagnosis not present

## 2018-12-12 DIAGNOSIS — R933 Abnormal findings on diagnostic imaging of other parts of digestive tract: Secondary | ICD-10-CM | POA: Diagnosis not present

## 2018-12-12 DIAGNOSIS — R932 Abnormal findings on diagnostic imaging of liver and biliary tract: Secondary | ICD-10-CM | POA: Diagnosis not present

## 2018-12-12 DIAGNOSIS — B9620 Unspecified Escherichia coli [E. coli] as the cause of diseases classified elsewhere: Secondary | ICD-10-CM | POA: Diagnosis present

## 2018-12-12 DIAGNOSIS — R1084 Generalized abdominal pain: Secondary | ICD-10-CM | POA: Diagnosis not present

## 2018-12-12 DIAGNOSIS — N10 Acute tubulo-interstitial nephritis: Secondary | ICD-10-CM | POA: Diagnosis not present

## 2018-12-12 DIAGNOSIS — I129 Hypertensive chronic kidney disease with stage 1 through stage 4 chronic kidney disease, or unspecified chronic kidney disease: Secondary | ICD-10-CM | POA: Diagnosis present

## 2018-12-12 DIAGNOSIS — N1832 Chronic kidney disease, stage 3b: Secondary | ICD-10-CM | POA: Diagnosis not present

## 2018-12-12 DIAGNOSIS — Z91048 Other nonmedicinal substance allergy status: Secondary | ICD-10-CM

## 2018-12-12 DIAGNOSIS — R109 Unspecified abdominal pain: Secondary | ICD-10-CM | POA: Diagnosis present

## 2018-12-12 DIAGNOSIS — R270 Ataxia, unspecified: Secondary | ICD-10-CM | POA: Diagnosis not present

## 2018-12-12 DIAGNOSIS — N132 Hydronephrosis with renal and ureteral calculous obstruction: Secondary | ICD-10-CM | POA: Diagnosis not present

## 2018-12-12 DIAGNOSIS — E7800 Pure hypercholesterolemia, unspecified: Secondary | ICD-10-CM | POA: Diagnosis not present

## 2018-12-12 DIAGNOSIS — R110 Nausea: Secondary | ICD-10-CM | POA: Diagnosis not present

## 2018-12-12 LAB — COMPREHENSIVE METABOLIC PANEL
ALT: 13 U/L (ref 0–44)
AST: 20 U/L (ref 15–41)
Albumin: 3.5 g/dL (ref 3.5–5.0)
Alkaline Phosphatase: 74 U/L (ref 38–126)
Anion gap: 24 — ABNORMAL HIGH (ref 5–15)
BUN: 87 mg/dL — ABNORMAL HIGH (ref 8–23)
CO2: 14 mmol/L — ABNORMAL LOW (ref 22–32)
Calcium: 8.9 mg/dL (ref 8.9–10.3)
Chloride: 96 mmol/L — ABNORMAL LOW (ref 98–111)
Creatinine, Ser: 11.15 mg/dL — ABNORMAL HIGH (ref 0.44–1.00)
GFR calc Af Amer: 4 mL/min — ABNORMAL LOW (ref >60)
GFR calc non Af Amer: 3 mL/min — ABNORMAL LOW (ref >60)
Glucose, Bld: 133 mg/dL — ABNORMAL HIGH (ref 70–99)
Potassium: 6.2 mmol/L — ABNORMAL HIGH (ref 3.5–5.1)
Sodium: 134 mmol/L — ABNORMAL LOW (ref 135–145)
Total Bilirubin: 4.3 mg/dL — ABNORMAL HIGH (ref 0.3–1.2)
Total Protein: 7.7 g/dL (ref 6.5–8.1)

## 2018-12-12 LAB — CBC
HCT: 36.5 % (ref 36.0–46.0)
Hemoglobin: 11.7 g/dL — ABNORMAL LOW (ref 12.0–15.0)
MCH: 28.9 pg (ref 26.0–34.0)
MCHC: 32.1 g/dL (ref 30.0–36.0)
MCV: 90.1 fL (ref 80.0–100.0)
Platelets: 311 10*3/uL (ref 150–400)
RBC: 4.05 MIL/uL (ref 3.87–5.11)
RDW: 14.2 % (ref 11.5–15.5)
WBC: 8.6 10*3/uL (ref 4.0–10.5)
nRBC: 0 % (ref 0.0–0.2)

## 2018-12-12 LAB — LIPASE, BLOOD: Lipase: 26 U/L (ref 11–51)

## 2018-12-12 MED ORDER — SODIUM CHLORIDE 0.9% FLUSH: 3.0000 mL | Freq: Once | INTRAVENOUS | Status: DC

## 2018-12-12 MED ORDER — ONDANSETRON 4 MG PO TBDP
4.0000 mg | ORAL_TABLET | Freq: Once | ORAL | Status: AC | PRN
  Administered 2018-12-12: 4 mg via ORAL
  Filled 2018-12-12: qty 1

## 2018-12-12 NOTE — ED Triage Notes (Signed)
Pt reports 6 weeks of intermittent nausea and vomiting, some dizziness. Pt has been seen by an ENT and neurologist since then but they cannot figure out what is going on. Pt also reports BP fluctuations. Pt a.o, nad noted.

## 2018-12-13 ENCOUNTER — Inpatient Hospital Stay (HOSPITAL_COMMUNITY): Payer: Medicare Other | Admitting: Certified Registered Nurse Anesthetist

## 2018-12-13 ENCOUNTER — Encounter (HOSPITAL_COMMUNITY): Payer: Self-pay | Admitting: Radiology

## 2018-12-13 ENCOUNTER — Emergency Department (HOSPITAL_COMMUNITY): Payer: Medicare Other

## 2018-12-13 ENCOUNTER — Encounter (HOSPITAL_COMMUNITY)
Admission: EM | Disposition: A | Discharge status: Home / Self Care | Payer: Self-pay | Source: Home / Self Care | Attending: Internal Medicine

## 2018-12-13 ENCOUNTER — Inpatient Hospital Stay (HOSPITAL_COMMUNITY): Payer: Medicare Other

## 2018-12-13 DIAGNOSIS — E875 Hyperkalemia: Secondary | ICD-10-CM

## 2018-12-13 DIAGNOSIS — K381 Appendicular concretions: Secondary | ICD-10-CM | POA: Diagnosis present

## 2018-12-13 DIAGNOSIS — J45909 Unspecified asthma, uncomplicated: Secondary | ICD-10-CM | POA: Diagnosis present

## 2018-12-13 DIAGNOSIS — N179 Acute kidney failure, unspecified: Secondary | ICD-10-CM

## 2018-12-13 DIAGNOSIS — R17 Unspecified jaundice: Secondary | ICD-10-CM

## 2018-12-13 DIAGNOSIS — G894 Chronic pain syndrome: Secondary | ICD-10-CM | POA: Diagnosis present

## 2018-12-13 DIAGNOSIS — Z981 Arthrodesis status: Secondary | ICD-10-CM | POA: Diagnosis not present

## 2018-12-13 DIAGNOSIS — Z79891 Long term (current) use of opiate analgesic: Secondary | ICD-10-CM | POA: Diagnosis not present

## 2018-12-13 DIAGNOSIS — F419 Anxiety disorder, unspecified: Secondary | ICD-10-CM

## 2018-12-13 DIAGNOSIS — N139 Obstructive and reflux uropathy, unspecified: Secondary | ICD-10-CM

## 2018-12-13 DIAGNOSIS — N136 Pyonephrosis: Secondary | ICD-10-CM | POA: Diagnosis present

## 2018-12-13 DIAGNOSIS — E860 Dehydration: Secondary | ICD-10-CM | POA: Diagnosis present

## 2018-12-13 DIAGNOSIS — R1084 Generalized abdominal pain: Secondary | ICD-10-CM | POA: Diagnosis not present

## 2018-12-13 DIAGNOSIS — N10 Acute tubulo-interstitial nephritis: Secondary | ICD-10-CM

## 2018-12-13 DIAGNOSIS — K5900 Constipation, unspecified: Secondary | ICD-10-CM | POA: Diagnosis present

## 2018-12-13 DIAGNOSIS — N1832 Chronic kidney disease, stage 3b: Secondary | ICD-10-CM

## 2018-12-13 DIAGNOSIS — N130 Hydronephrosis with ureteropelvic junction obstruction: Secondary | ICD-10-CM

## 2018-12-13 DIAGNOSIS — K8050 Calculus of bile duct without cholangitis or cholecystitis without obstruction: Secondary | ICD-10-CM

## 2018-12-13 DIAGNOSIS — N202 Calculus of kidney with calculus of ureter: Secondary | ICD-10-CM | POA: Diagnosis present

## 2018-12-13 DIAGNOSIS — R197 Diarrhea, unspecified: Secondary | ICD-10-CM | POA: Diagnosis present

## 2018-12-13 DIAGNOSIS — N183 Chronic kidney disease, stage 3 unspecified: Secondary | ICD-10-CM | POA: Diagnosis present

## 2018-12-13 DIAGNOSIS — R2681 Unsteadiness on feet: Secondary | ICD-10-CM | POA: Diagnosis present

## 2018-12-13 DIAGNOSIS — R112 Nausea with vomiting, unspecified: Secondary | ICD-10-CM

## 2018-12-13 DIAGNOSIS — K219 Gastro-esophageal reflux disease without esophagitis: Secondary | ICD-10-CM | POA: Diagnosis present

## 2018-12-13 DIAGNOSIS — N1831 Chronic kidney disease, stage 3a: Secondary | ICD-10-CM | POA: Diagnosis present

## 2018-12-13 DIAGNOSIS — R270 Ataxia, unspecified: Secondary | ICD-10-CM

## 2018-12-13 DIAGNOSIS — D631 Anemia in chronic kidney disease: Secondary | ICD-10-CM | POA: Diagnosis present

## 2018-12-13 DIAGNOSIS — N178 Other acute kidney failure: Secondary | ICD-10-CM | POA: Diagnosis present

## 2018-12-13 DIAGNOSIS — R109 Unspecified abdominal pain: Secondary | ICD-10-CM | POA: Diagnosis present

## 2018-12-13 DIAGNOSIS — G43909 Migraine, unspecified, not intractable, without status migrainosus: Secondary | ICD-10-CM | POA: Diagnosis present

## 2018-12-13 DIAGNOSIS — Z20828 Contact with and (suspected) exposure to other viral communicable diseases: Secondary | ICD-10-CM | POA: Diagnosis present

## 2018-12-13 DIAGNOSIS — M3500 Sicca syndrome, unspecified: Secondary | ICD-10-CM | POA: Diagnosis present

## 2018-12-13 DIAGNOSIS — Z79899 Other long term (current) drug therapy: Secondary | ICD-10-CM | POA: Diagnosis not present

## 2018-12-13 DIAGNOSIS — B9620 Unspecified Escherichia coli [E. coli] as the cause of diseases classified elsewhere: Secondary | ICD-10-CM | POA: Diagnosis present

## 2018-12-13 DIAGNOSIS — E1122 Type 2 diabetes mellitus with diabetic chronic kidney disease: Secondary | ICD-10-CM | POA: Diagnosis present

## 2018-12-13 DIAGNOSIS — K838 Other specified diseases of biliary tract: Secondary | ICD-10-CM

## 2018-12-13 DIAGNOSIS — I10 Essential (primary) hypertension: Secondary | ICD-10-CM

## 2018-12-13 HISTORY — PX: CYSTOSCOPY W/ URETERAL STENT PLACEMENT: SHX1429

## 2018-12-13 LAB — PROTEIN / CREATININE RATIO, URINE
Creatinine, Urine: 69.62 mg/dL
Protein Creatinine Ratio: 0.7 mg/mg{Cre} — ABNORMAL HIGH (ref 0.00–0.15)
Total Protein, Urine: 49 mg/dL

## 2018-12-13 LAB — BASIC METABOLIC PANEL
Anion gap: 25 — ABNORMAL HIGH (ref 5–15)
BUN: 90 mg/dL — ABNORMAL HIGH (ref 8–23)
CO2: 13 mmol/L — ABNORMAL LOW (ref 22–32)
Calcium: 8.5 mg/dL — ABNORMAL LOW (ref 8.9–10.3)
Chloride: 97 mmol/L — ABNORMAL LOW (ref 98–111)
Creatinine, Ser: 11.5 mg/dL — ABNORMAL HIGH (ref 0.44–1.00)
GFR calc Af Amer: 4 mL/min — ABNORMAL LOW (ref >60)
GFR calc non Af Amer: 3 mL/min — ABNORMAL LOW (ref >60)
Glucose, Bld: 95 mg/dL (ref 70–99)
Potassium: 5.1 mmol/L (ref 3.5–5.1)
Sodium: 135 mmol/L (ref 135–145)

## 2018-12-13 LAB — COMPREHENSIVE METABOLIC PANEL
ALT: 12 U/L (ref 0–44)
AST: 29 U/L (ref 15–41)
Albumin: 3 g/dL — ABNORMAL LOW (ref 3.5–5.0)
Alkaline Phosphatase: 70 U/L (ref 38–126)
Anion gap: 22 — ABNORMAL HIGH (ref 5–15)
BUN: 79 mg/dL — ABNORMAL HIGH (ref 8–23)
CO2: 14 mmol/L — ABNORMAL LOW (ref 22–32)
Calcium: 9 mg/dL (ref 8.9–10.3)
Chloride: 103 mmol/L (ref 98–111)
Creatinine, Ser: 9.54 mg/dL — ABNORMAL HIGH (ref 0.44–1.00)
GFR calc Af Amer: 4 mL/min — ABNORMAL LOW (ref >60)
GFR calc non Af Amer: 4 mL/min — ABNORMAL LOW (ref >60)
Glucose, Bld: 136 mg/dL — ABNORMAL HIGH (ref 70–99)
Potassium: 5.9 mmol/L — ABNORMAL HIGH (ref 3.5–5.1)
Sodium: 139 mmol/L (ref 135–145)
Total Bilirubin: 3 mg/dL — ABNORMAL HIGH (ref 0.3–1.2)
Total Protein: 6.6 g/dL (ref 6.5–8.1)

## 2018-12-13 LAB — CBC
HCT: 32.9 % — ABNORMAL LOW (ref 36.0–46.0)
Hemoglobin: 10.5 g/dL — ABNORMAL LOW (ref 12.0–15.0)
MCH: 28.9 pg (ref 26.0–34.0)
MCHC: 31.9 g/dL (ref 30.0–36.0)
MCV: 90.6 fL (ref 80.0–100.0)
Platelets: 336 10*3/uL (ref 150–400)
RBC: 3.63 MIL/uL — ABNORMAL LOW (ref 3.87–5.11)
RDW: 14.4 % (ref 11.5–15.5)
WBC: 7.8 10*3/uL (ref 4.0–10.5)
nRBC: 0 % (ref 0.0–0.2)

## 2018-12-13 LAB — APTT: aPTT: 36 seconds (ref 24–36)

## 2018-12-13 LAB — GLUCOSE, CAPILLARY: Glucose-Capillary: 104 mg/dL — ABNORMAL HIGH (ref 70–99)

## 2018-12-13 LAB — RAPID HIV SCREEN (HIV 1/2 AB+AG)
HIV 1/2 Antibodies: NONREACTIVE
HIV-1 P24 Antigen - HIV24: NONREACTIVE

## 2018-12-13 LAB — CBG MONITORING, ED
Glucose-Capillary: 218 mg/dL — ABNORMAL HIGH (ref 70–99)
Glucose-Capillary: 84 mg/dL (ref 70–99)
Glucose-Capillary: 66 mg/dL — ABNORMAL LOW (ref 70–99)

## 2018-12-13 LAB — CK
Total CK: 198 U/L (ref 38–234)
Total CK: 259 U/L — ABNORMAL HIGH (ref 38–234)

## 2018-12-13 LAB — SARS CORONAVIRUS 2 BY RT PCR (HOSPITAL ORDER, PERFORMED IN ~~LOC~~ HOSPITAL LAB): SARS Coronavirus 2: NEGATIVE

## 2018-12-13 LAB — PROTIME-INR
INR: 1.2 (ref 0.8–1.2)
Prothrombin Time: 14.7 seconds (ref 11.4–15.2)

## 2018-12-13 SURGERY — CYSTOSCOPY, WITH RETROGRADE PYELOGRAM AND URETERAL STENT INSERTION
Anesthesia: General | Laterality: Right

## 2018-12-13 MED ORDER — ACETAMINOPHEN 650 MG RE SUPP: 650.0000 mg | Freq: Four times a day (QID) | RECTAL | Status: DC | PRN

## 2018-12-13 MED ORDER — CIPROFLOXACIN IN D5W 400 MG/200ML IV SOLN
400.0000 mg | INTRAVENOUS | Status: AC
  Administered 2018-12-13: 400 mg via INTRAVENOUS
  Filled 2018-12-13: qty 200

## 2018-12-13 MED ORDER — STERILE WATER FOR IRRIGATION IR SOLN
Status: DC | PRN
  Administered 2018-12-13: 1000 mL via INTRAVESICAL

## 2018-12-13 MED ORDER — SODIUM BICARBONATE 8.4 % IV SOLN: 50.0000 meq | Freq: Once | INTRAVENOUS | Status: DC

## 2018-12-13 MED ORDER — DEXTROSE 50 % IV SOLN
1.0000 | Freq: Once | INTRAVENOUS | Status: AC
  Administered 2018-12-13: 50 mL via INTRAVENOUS
  Filled 2018-12-13: qty 50

## 2018-12-13 MED ORDER — ADULT MULTIVITAMIN W/MINERALS CH
1.0000 | ORAL_TABLET | Freq: Every day | ORAL | Status: DC
  Administered 2018-12-13 – 2018-12-15 (×3): 1 via ORAL
  Filled 2018-12-13 (×3): qty 1

## 2018-12-13 MED ORDER — MORPHINE SULFATE (PF) 2 MG/ML IV SOLN
2.0000 mg | INTRAVENOUS | Status: DC | PRN
  Administered 2018-12-13 – 2018-12-14 (×2): 2 mg via INTRAVENOUS
  Filled 2018-12-13 (×2): qty 1

## 2018-12-13 MED ORDER — SODIUM CHLORIDE 0.9 % IV SOLN
INTRAVENOUS | Status: DC
  Administered 2018-12-13 – 2018-12-15 (×4): via INTRAVENOUS

## 2018-12-13 MED ORDER — ONDANSETRON HCL 4 MG/2ML IJ SOLN
4.0000 mg | Freq: Once | INTRAMUSCULAR | Status: AC
  Administered 2018-12-13: 4 mg via INTRAVENOUS

## 2018-12-13 MED ORDER — ONDANSETRON HCL 4 MG/2ML IJ SOLN
INTRAMUSCULAR | Status: AC
  Administered 2018-12-13: 4 mg
  Filled 2018-12-13: qty 2

## 2018-12-13 MED ORDER — OXYCODONE HCL 5 MG PO TABS
ORAL_TABLET | ORAL | Status: AC
  Filled 2018-12-13: qty 1

## 2018-12-13 MED ORDER — METHOCARBAMOL 500 MG PO TABS
1000.0000 mg | ORAL_TABLET | Freq: Three times a day (TID) | ORAL | Status: DC | PRN
  Administered 2018-12-13 – 2018-12-15 (×4): 1000 mg via ORAL
  Filled 2018-12-13 (×4): qty 2

## 2018-12-13 MED ORDER — SODIUM ZIRCONIUM CYCLOSILICATE 10 G PO PACK
10.0000 g | PACK | Freq: Once | ORAL | Status: AC
  Administered 2018-12-14: 10 g via ORAL
  Filled 2018-12-13 (×2): qty 1

## 2018-12-13 MED ORDER — INSULIN ASPART 100 UNIT/ML IV SOLN
5.0000 [IU] | Freq: Once | INTRAVENOUS | Status: AC
  Administered 2018-12-13: 5 [IU] via INTRAVENOUS

## 2018-12-13 MED ORDER — PHENYLEPHRINE HCL (PRESSORS) 10 MG/ML IV SOLN
INTRAVENOUS | Status: DC | PRN
  Administered 2018-12-13 (×4): 80 ug via INTRAVENOUS

## 2018-12-13 MED ORDER — ONDANSETRON HCL 4 MG/2ML IJ SOLN: 4.0000 mg | Freq: Three times a day (TID) | INTRAMUSCULAR | Status: DC | PRN

## 2018-12-13 MED ORDER — OXYCODONE HCL 5 MG PO TABS
5.0000 mg | ORAL_TABLET | Freq: Once | ORAL | Status: AC | PRN
  Administered 2018-12-13: 5 mg via ORAL

## 2018-12-13 MED ORDER — SODIUM ZIRCONIUM CYCLOSILICATE 10 G PO PACK: 10.0000 g | PACK | Freq: Every day | ORAL | Status: DC

## 2018-12-13 MED ORDER — LIDOCAINE HCL URETHRAL/MUCOSAL 2 % EX GEL
CUTANEOUS | Status: AC
  Filled 2018-12-13: qty 20

## 2018-12-13 MED ORDER — MORPHINE SULFATE ER 15 MG PO TBCR: 30.0000 mg | EXTENDED_RELEASE_TABLET | Freq: Three times a day (TID) | ORAL | Status: DC

## 2018-12-13 MED ORDER — LACTATED RINGERS IV SOLN
INTRAVENOUS | Status: DC | PRN
  Administered 2018-12-13: 08:00:00 via INTRAVENOUS

## 2018-12-13 MED ORDER — LABETALOL HCL 5 MG/ML IV SOLN: 5.0000 mg | INTRAVENOUS | Status: DC | PRN

## 2018-12-13 MED ORDER — IOHEXOL 300 MG/ML  SOLN
INTRAMUSCULAR | Status: DC | PRN
  Administered 2018-12-13: 50 mL via URETHRAL

## 2018-12-13 MED ORDER — OXYCODONE HCL 5 MG PO TABS
2.5000 mg | ORAL_TABLET | Freq: Three times a day (TID) | ORAL | Status: DC | PRN
  Administered 2018-12-13 – 2018-12-15 (×2): 2.5 mg via ORAL
  Filled 2018-12-13 (×2): qty 1

## 2018-12-13 MED ORDER — ONDANSETRON HCL 4 MG/2ML IJ SOLN: 4.0000 mg | Freq: Once | INTRAMUSCULAR | Status: DC | PRN

## 2018-12-13 MED ORDER — SODIUM CHLORIDE 0.9 % IV SOLN
1.0000 g | INTRAVENOUS | Status: DC
  Administered 2018-12-13 – 2018-12-14 (×2): 1 g via INTRAVENOUS
  Filled 2018-12-13 (×4): qty 1

## 2018-12-13 MED ORDER — MIDAZOLAM HCL 2 MG/2ML IJ SOLN
INTRAMUSCULAR | Status: AC
  Filled 2018-12-13: qty 2

## 2018-12-13 MED ORDER — FENTANYL CITRATE (PF) 100 MCG/2ML IJ SOLN
25.0000 ug | INTRAMUSCULAR | Status: DC | PRN
  Administered 2018-12-13 (×4): 25 ug via INTRAVENOUS

## 2018-12-13 MED ORDER — SUCCINYLCHOLINE CHLORIDE 200 MG/10ML IV SOSY
PREFILLED_SYRINGE | INTRAVENOUS | Status: AC
  Filled 2018-12-13: qty 10

## 2018-12-13 MED ORDER — CALCIUM CARBONATE ANTACID 500 MG PO CHEW
1.0000 | CHEWABLE_TABLET | Freq: Two times a day (BID) | ORAL | Status: DC | PRN
  Administered 2018-12-13 – 2018-12-14 (×2): 200 mg via ORAL
  Filled 2018-12-13 (×2): qty 1

## 2018-12-13 MED ORDER — ACETAMINOPHEN 325 MG PO TABS: 650.0000 mg | ORAL_TABLET | Freq: Four times a day (QID) | ORAL | Status: DC | PRN

## 2018-12-13 MED ORDER — POLYETHYL GLYCOL-PROPYL GLYCOL 0.4-0.3 % OP SOLN: 1.0000 [drp] | Freq: Two times a day (BID) | OPHTHALMIC | Status: DC | PRN

## 2018-12-13 MED ORDER — HYDROXYZINE HCL 25 MG PO TABS
25.0000 mg | ORAL_TABLET | Freq: Three times a day (TID) | ORAL | Status: DC | PRN
  Administered 2018-12-13: 25 mg via ORAL
  Filled 2018-12-13: qty 1

## 2018-12-13 MED ORDER — OXYCODONE-ACETAMINOPHEN 5-325 MG PO TABS
1.0000 | ORAL_TABLET | Freq: Three times a day (TID) | ORAL | Status: DC | PRN
  Administered 2018-12-13 – 2018-12-15 (×4): 1 via ORAL
  Filled 2018-12-13 (×5): qty 1

## 2018-12-13 MED ORDER — ONDANSETRON HCL 4 MG/2ML IJ SOLN
INTRAMUSCULAR | Status: DC | PRN
  Administered 2018-12-13: 4 mg via INTRAVENOUS

## 2018-12-13 MED ORDER — HYPROMELLOSE (GONIOSCOPIC) 2.5 % OP SOLN
1.0000 [drp] | Freq: Two times a day (BID) | OPHTHALMIC | Status: DC | PRN
  Filled 2018-12-13: qty 15

## 2018-12-13 MED ORDER — DEXAMETHASONE SODIUM PHOSPHATE 10 MG/ML IJ SOLN
INTRAMUSCULAR | Status: DC | PRN
  Administered 2018-12-13: 10 mg via INTRAVENOUS

## 2018-12-13 MED ORDER — EPHEDRINE SULFATE 50 MG/ML IJ SOLN
INTRAMUSCULAR | Status: DC | PRN
  Administered 2018-12-13: 5 mg via INTRAVENOUS
  Administered 2018-12-13: 10 mg via INTRAVENOUS

## 2018-12-13 MED ORDER — FENTANYL CITRATE (PF) 100 MCG/2ML IJ SOLN
INTRAMUSCULAR | Status: DC | PRN
  Administered 2018-12-13 (×2): 50 ug via INTRAVENOUS
  Administered 2018-12-13 (×2): 25 ug via INTRAVENOUS
  Administered 2018-12-13 (×2): 50 ug via INTRAVENOUS

## 2018-12-13 MED ORDER — HYDRALAZINE HCL 25 MG PO TABS
25.0000 mg | ORAL_TABLET | Freq: Three times a day (TID) | ORAL | Status: DC
  Administered 2018-12-13 – 2018-12-15 (×7): 25 mg via ORAL
  Filled 2018-12-13 (×7): qty 1

## 2018-12-13 MED ORDER — OXYCODONE-ACETAMINOPHEN 7.5-325 MG PO TABS: 1.0000 | ORAL_TABLET | Freq: Three times a day (TID) | ORAL | Status: DC | PRN

## 2018-12-13 MED ORDER — PROPOFOL 10 MG/ML IV BOLUS
INTRAVENOUS | Status: AC
  Filled 2018-12-13: qty 20

## 2018-12-13 MED ORDER — FENTANYL CITRATE (PF) 250 MCG/5ML IJ SOLN
INTRAMUSCULAR | Status: AC
  Filled 2018-12-13: qty 5

## 2018-12-13 MED ORDER — SODIUM ZIRCONIUM CYCLOSILICATE 10 G PO PACK: 10.0000 g | PACK | Freq: Once | ORAL | Status: DC

## 2018-12-13 MED ORDER — SUGAMMADEX SODIUM 200 MG/2ML IV SOLN
INTRAVENOUS | Status: DC | PRN
  Administered 2018-12-13: 200 mg via INTRAVENOUS

## 2018-12-13 MED ORDER — LIDOCAINE HCL (CARDIAC) PF 100 MG/5ML IV SOSY
PREFILLED_SYRINGE | INTRAVENOUS | Status: DC | PRN
  Administered 2018-12-13: 80 mg via INTRAVENOUS

## 2018-12-13 MED ORDER — FENTANYL CITRATE (PF) 100 MCG/2ML IJ SOLN
INTRAMUSCULAR | Status: AC
  Filled 2018-12-13: qty 2

## 2018-12-13 MED ORDER — OXYCODONE HCL 5 MG/5ML PO SOLN: 5.0000 mg | Freq: Once | ORAL | Status: AC | PRN

## 2018-12-13 MED ORDER — FENTANYL CITRATE (PF) 100 MCG/2ML IJ SOLN: 50.0000 ug | Freq: Once | INTRAMUSCULAR | Status: DC

## 2018-12-13 MED ORDER — MIDAZOLAM HCL 5 MG/5ML IJ SOLN
INTRAMUSCULAR | Status: DC | PRN
  Administered 2018-12-13: 2 mg via INTRAVENOUS

## 2018-12-13 MED ORDER — VITAMIN D 25 MCG (1000 UNIT) PO TABS
1000.0000 [IU] | ORAL_TABLET | Freq: Every day | ORAL | Status: DC
  Administered 2018-12-13 – 2018-12-15 (×3): 1000 [IU] via ORAL
  Filled 2018-12-13 (×3): qty 1

## 2018-12-13 MED ORDER — SODIUM CHLORIDE 0.9 % IV BOLUS
1000.0000 mL | Freq: Once | INTRAVENOUS | Status: AC
  Administered 2018-12-13: 1000 mL via INTRAVENOUS

## 2018-12-13 MED ORDER — SODIUM ZIRCONIUM CYCLOSILICATE 10 G PO PACK
10.0000 g | PACK | Freq: Every day | ORAL | Status: DC
  Administered 2018-12-13: 10 g via ORAL
  Filled 2018-12-13: qty 1

## 2018-12-13 MED ORDER — MELATONIN 3 MG PO TABS
1.0000 | ORAL_TABLET | Freq: Every evening | ORAL | Status: DC | PRN
  Administered 2018-12-13 – 2018-12-14 (×2): 3 mg via ORAL
  Filled 2018-12-13 (×3): qty 1

## 2018-12-13 MED ORDER — PROPOFOL 10 MG/ML IV BOLUS
INTRAVENOUS | Status: DC | PRN
  Administered 2018-12-13: 150 mg via INTRAVENOUS

## 2018-12-13 MED ORDER — LIDOCAINE 2% (20 MG/ML) 5 ML SYRINGE
INTRAMUSCULAR | Status: AC
  Filled 2018-12-13: qty 5

## 2018-12-13 MED ORDER — ROCURONIUM BROMIDE 100 MG/10ML IV SOLN
INTRAVENOUS | Status: DC | PRN
  Administered 2018-12-13: 50 mg via INTRAVENOUS

## 2018-12-13 MED ORDER — ONDANSETRON HCL 4 MG/2ML IJ SOLN
INTRAMUSCULAR | Status: AC
  Filled 2018-12-13: qty 2

## 2018-12-13 MED ORDER — FLUTICASONE PROPIONATE 50 MCG/ACT NA SUSP
1.0000 | Freq: Two times a day (BID) | NASAL | Status: DC
  Administered 2018-12-14 – 2018-12-15 (×2): 1 via NASAL
  Filled 2018-12-13: qty 16

## 2018-12-13 SURGICAL SUPPLY — 39 items
BAG URO CATCHER STRL LF (MISCELLANEOUS) ×2 IMPLANT
BASKET LASER NITINOL 1.9FR (BASKET) IMPLANT
BASKET STNLS GEMINI 4WIRE 3FR (BASKET) IMPLANT
BASKET ZERO TIP NITINOL 2.4FR (BASKET) IMPLANT
CATH INTERMIT  6FR 70CM (CATHETERS) IMPLANT
CATH URET 5FR 28IN CONE TIP (BALLOONS) ×1
CATH URET 5FR 28IN OPEN ENDED (CATHETERS) ×2 IMPLANT
CATH URET 5FR 70CM CONE TIP (BALLOONS) ×1 IMPLANT
CATH URET DUAL LUMEN 6-10FR 50 (CATHETERS) ×2 IMPLANT
COVER WAND RF STERILE (DRAPES) IMPLANT
ELECT REM PT RETURN 9FT ADLT (ELECTROSURGICAL)
ELECTRODE REM PT RTRN 9FT ADLT (ELECTROSURGICAL) IMPLANT
GLOVE BIO SURGEON STRL SZ7.5 (GLOVE) ×2 IMPLANT
GLOVE BIOGEL PI IND STRL 8 (GLOVE) ×4 IMPLANT
GLOVE BIOGEL PI INDICATOR 8 (GLOVE) ×4
GOWN STRL REUS W/ TWL LRG LVL3 (GOWN DISPOSABLE) IMPLANT
GOWN STRL REUS W/ TWL XL LVL3 (GOWN DISPOSABLE) ×1 IMPLANT
GOWN STRL REUS W/TWL 2XL LVL3 (GOWN DISPOSABLE) ×4 IMPLANT
GOWN STRL REUS W/TWL LRG LVL3 (GOWN DISPOSABLE)
GOWN STRL REUS W/TWL XL LVL3 (GOWN DISPOSABLE) ×1
GUIDEWIRE ANG ZIPWIRE 038X150 (WIRE) IMPLANT
GUIDEWIRE STR DUAL SENSOR (WIRE) ×2 IMPLANT
IV NS IRRIG 3000ML ARTHROMATIC (IV SOLUTION) IMPLANT
KIT BALLN UROMAX 15FX4 (MISCELLANEOUS) IMPLANT
KIT BALLN UROMAX 26 75X4 (MISCELLANEOUS)
KIT TURNOVER KIT B (KITS) ×2 IMPLANT
MANIFOLD NEPTUNE II (INSTRUMENTS) ×2 IMPLANT
NS IRRIG 1000ML POUR BTL (IV SOLUTION) IMPLANT
PACK CYSTO (CUSTOM PROCEDURE TRAY) ×2 IMPLANT
SET IRRIG Y TYPE TUR BLADDER L (SET/KITS/TRAYS/PACK) ×2 IMPLANT
SHEATH URETERAL 12FRX35CM (MISCELLANEOUS) IMPLANT
SOL PREP POV-IOD 4OZ 10% (MISCELLANEOUS) IMPLANT
STENT URET 6FRX24 CONTOUR (STENTS) ×2 IMPLANT
SYRINGE IRR TOOMEY STRL 70CC (SYRINGE) ×2 IMPLANT
TRAY FOLEY W/BAG SLVR 16FR (SET/KITS/TRAYS/PACK) ×1
TRAY FOLEY W/BAG SLVR 16FR ST (SET/KITS/TRAYS/PACK) ×1 IMPLANT
TUBE CONNECTING 12X1/4 (SUCTIONS) ×2 IMPLANT
WATER STERILE IRR 1000ML UROMA (IV SOLUTION) ×2 IMPLANT
WATER STERILE IRR 3000ML UROMA (IV SOLUTION) IMPLANT

## 2018-12-13 NOTE — Consult Note (Addendum)
Urology Consult   Physician requesting consult: Lorretta HarpXilin Niu MD  Reason for consult: Nephrolithiasis  History of Present Illness: Mary [name] is a 68 y.o. female presenting today with 6 weeks of emesis and found on CT scan to have left atrophic kidney, right obstructing ureteral pelvic junction stone, inflammation of appendix stump, and biliary sludge.  She reports that she has been having increasing nausea and dizziness for 6 weeks for which she had seen ENT and neurology with negative work-up thus far.  She began feeling worse in the past few days.  She believes that on this past Sunday, she had a fever of 102 but has had no fevers since then.  She overall had not been in pain but developed some periumbilical pain today which has not migrated.  She denies hematuria, dysuria, difficulty voiding.  She does feel that she is dehydrated and urine output may be less than usual.  Given increased emesis and new periumbilical pain, she decided to present to the ED where the CT scan was performed with the above noted findings.  She was also found to have a severe AKI of creatinine 11 and potassium of 6.2.  Potassium is being treated by ED physicians, and she is being admitted to hospitalist service.  From urologic standpoint, she states that she had "lithotripsy" about 12 years ago on her left kidney.  She does not recall getting a CT scan, and she has never been told about a left atrophic kidney.  UA pending as patient initially unable to urinate.   Past Medical History:  Diagnosis Date  . Allergy   . Arthritis   . Asthma   . Chronic kidney disease   . Diabetes mellitus   . GERD (gastroesophageal reflux disease)   . Headache(784.0)   . High cholesterol   . Hypertension   . Migraines   . Sjogrens syndrome (HCC)   . Thyroid disease     Past Surgical History:  Procedure Laterality Date  . ABDOMINAL HYSTERECTOMY  1998  . ANTERIOR CERVICAL DECOMP/DISCECTOMY FUSION  2011  . APPENDECTOMY    .  BREAST BIOPSY  1971  . KIDNEY STONE SURGERY  2011  . LUMBAR FUSION  2005  . TONSILLECTOMY  1971     Current Hospital Medications:  Home meds:  No current facility-administered medications on file prior to encounter.    Current Outpatient Medications on File Prior to Encounter  Medication Sig Dispense Refill  . ALPRAZolam (XANAX) 1 MG tablet For claustrophobia - only pre MRI. 5 tablet 0  . butalbital-acetaminophen-caffeine (FIORICET, ESGIC) 50-325-40 MG tablet Take 1 tablet by mouth every 6 (six) hours as needed for headache. 90 tablet 1  . cholecalciferol (VITAMIN D3) 25 MCG (1000 UT) tablet Take 1,000 Units by mouth daily.    . fluticasone (FLONASE) 50 MCG/ACT nasal spray Place 1 spray into both nostrils 2 (two) times daily.    . furosemide (LASIX) 20 MG tablet TAKE 1 BY MOUTH TWICE DAILY 180 tablet 0  . hydrOXYzine (ATARAX/VISTARIL) 25 MG tablet Take 25 mg by mouth every 8 (eight) hours as needed.    . lidocaine (XYLOCAINE) 2 % solution Use as directed 20 mLs in the mouth or throat every 6 (six) hours as needed for mouth pain.   0  . lisinopril (ZESTRIL) 40 MG tablet     . Melatonin 3 MG TABS Take 1 tablet by mouth at bedtime as needed.    . methocarbamol (ROBAXIN) 500 MG tablet Take 2 tabs four  times daily    . morphine (MS CONTIN) 30 MG 12 hr tablet Take 30 mg by mouth every 8 (eight) hours.     . Multiple Vitamin (MULTIVITAMIN) tablet Take 1 tablet by mouth daily.    Marland Kitchen oxyCODONE-acetaminophen (PERCOCET) 7.5-325 MG per tablet Take 1 tablet by mouth 3 (three) times daily as needed for severe pain.     Mary [name] (SYSTANE) 0.4-0.3 % SOLN Apply 1 drop to eye 3 times/day as needed-between meals & bedtime.    Marland Kitchen zolpidem (AMBIEN CR) 6.25 MG CR tablet Take 6.25 mg by mouth at bedtime as needed for sleep.       Scheduled Meds: . ondansetron      . sodium chloride flush  3 mL Intravenous Once  . sodium zirconium cyclosilicate  10 g Oral Daily   Continuous  Infusions: PRN Meds:.labetalol, morphine injection, ondansetron (ZOFRAN) IV  Allergies:  Allergies  Allergen Reactions  . Sulfa Antibiotics Nausea And Vomiting  . Adhesive [Tape]   . Celebrex [Celecoxib]   . Lyrica [Pregabalin]   . Macrobid [Nitrofurantoin Macrocrystal]   . Penicillins   . Tribenzor [Olmesartan-Amlodipine-Hctz]     dizziness  . Tricor [Fenofibrate]   . Vioxx [Rofecoxib]     Generally not well while taking this  . Cephalexin     Other reaction(s): Abdominal Pain    Family History  Problem Relation Age of Onset  . Cancer Neg Hx   . Early death Neg Hx   . Heart disease Neg Hx   . Hyperlipidemia Neg Hx   . Hypertension Neg Hx   . Stroke Neg Hx     Social History:  reports that she has quit smoking. She has never used smokeless tobacco. She reports that she does not drink alcohol or use drugs.  ROS: A complete review of systems was performed.  All systems are negative except for pertinent findings as noted.  Physical Exam:  Vital signs in last 24 hours: Temp:  [98.1 F (36.7 C)-98.2 F (36.8 C)] 98.1 F (36.7 C) (10/15 0055) Pulse Rate:  [74-87] 86 (10/15 0055) Resp:  [16-20] 20 (10/15 0055) BP: (124-165)/(67-98) 165/74 (10/15 0055) SpO2:  [98 %-100 %] 98 % (10/15 0055) Constitutional:  Alert and oriented, moderately uncomfortable given severe nausea during interview Cardiovascular: Regular rate  Respiratory: Normal respiratory effort GI: Abdomen is soft.  Tenderness to palpation in periumbilical region and slightly to right of umbilicus.  No rebound tenderness.  No left-sided abdominal pain.  No supra pubic tenderness. GU: No CVA tenderness bilaterally.  Patient jumped slightly on palpation of right side but she is stated it was from sensation of being tapped and confirmed that it was not painful or tender. Lymphatic: No lymphadenopathy Neurologic: Grossly intact, no focal deficits Psychiatric: Normal mood and affect  Laboratory Data:  Recent Labs     12/12/18 1942  WBC 8.6  HGB 11.7*  HCT 36.5  PLT 311    Recent Labs    12/12/18 1942  NA 134*  K 6.2*  CL 96*  GLUCOSE 133*  BUN 87*  CALCIUM 8.9  CREATININE 11.15*     Results for orders placed or performed during the hospital encounter of 12/12/18 (from the past 24 hour(s))  Lipase, blood     Status: None   Collection Time: 12/12/18  7:42 PM  Result Value Ref Range   Lipase 26 11 - 51 U/L  Comprehensive metabolic panel     Status: Abnormal   Collection  Time: 12/12/18  7:42 PM  Result Value Ref Range   Sodium 134 (L) 135 - 145 mmol/L   Potassium 6.2 (H) 3.5 - 5.1 mmol/L   Chloride 96 (L) 98 - 111 mmol/L   CO2 14 (L) 22 - 32 mmol/L   Glucose, Bld 133 (H) 70 - 99 mg/dL   BUN 87 (H) 8 - 23 mg/dL   Creatinine, Ser 16.10 (H) 0.44 - 1.00 mg/dL   Calcium 8.9 8.9 - 96.0 mg/dL   Total Protein 7.7 6.5 - 8.1 g/dL   Albumin 3.5 3.5 - 5.0 g/dL   AST 20 15 - 41 U/L   ALT 13 0 - 44 U/L   Alkaline Phosphatase 74 38 - 126 U/L   Total Bilirubin 4.3 (H) 0.3 - 1.2 mg/dL   GFR calc non Af Amer 3 (L) >60 mL/min   GFR calc Af Amer 4 (L) >60 mL/min   Anion gap 24 (H) 5 - 15  CBC     Status: Abnormal   Collection Time: 12/12/18  7:42 PM  Result Value Ref Range   WBC 8.6 4.0 - 10.5 K/uL   RBC 4.05 3.87 - 5.11 MIL/uL   Hemoglobin 11.7 (L) 12.0 - 15.0 g/dL   HCT 45.4 09.8 - 11.9 %   MCV 90.1 80.0 - 100.0 fL   MCH 28.9 26.0 - 34.0 pg   MCHC 32.1 30.0 - 36.0 g/dL   RDW 14.7 82.9 - 56.2 %   Platelets 311 150 - 400 K/uL   nRBC 0.0 0.0 - 0.2 %  CBG monitoring, ED     Status: Abnormal   Collection Time: 12/13/18  3:02 AM  Result Value Ref Range   Glucose-Capillary 218 (H) 70 - 99 mg/dL   No results found for this or any previous visit (from the past 240 hour(s)).  Renal Function: Recent Labs    12/12/18 1942  CREATININE 11.15*   CrCl cannot be calculated (Unknown ideal weight.).  Radiologic Imaging: Ct Abdomen Pelvis Wo Contrast  Result Date: 12/13/2018 CLINICAL DATA:   Abdominal distension with 6 weeks of intermittent nausea and vomiting EXAM: CT ABDOMEN AND PELVIS WITHOUT CONTRAST TECHNIQUE: Multidetector CT imaging of the abdomen and pelvis was performed following the standard protocol without IV contrast. COMPARISON:  None FINDINGS: Lower chest: Bandlike areas of opacity in both lung bases likely reflect subsegmental atelectasis and/or scarring. Hepatobiliary: No focal liver abnormality is seen. Gradient density within the gallbladder lumen likely reflects biliary sludge. No visible calcified gallstones or gallbladder wall thickening. Common bile duct is borderline dilated at 8 mm. Pancreas: Fatty replacement of the pancreas. No pancreatic ductal dilatation or surrounding inflammatory changes. Spleen: Normal in size without focal abnormality. Adrenals/Urinary Tract: Normal adrenal glands. There is atrophy and extensive vascular calcification of the left kidney with a several fluid attenuation cysts. Additional hypoattenuating foci in the left kidney are too small to fully characterize on CT imaging but statistically likely benign. The right kidney is markedly enlarged, some of which may be compensatory hypertrophy however there is severe right hydronephrosis to the level of the 11 mm calculus at the right ureteropelvic junction. Distal ureter is more normal caliber. Urinary bladder is largely decompressed at the time of exam and therefore poorly evaluated by CT imaging. Stomach/Bowel: Distal esophagus, stomach and duodenal sweep are unremarkable. No small bowel wall thickening or dilatation. No evidence of obstruction. There are postsurgical changes at the tip of the cecum with several surgical clips however a short tubular structure extends  from the cecal tip with a large 15 mm calcification at the base suggesting a dilated, hyperemic appendiceal stump. Minimal stranding is seen adjacent to this location. Remainder of the colon has a more normal appearance. No colonic  dilatation or wall thickening. Vascular/Lymphatic: Atherosclerotic plaque is seen throughout the aorta and branch vessels. Mild fusiform ectasia of the infrarenal abdominal aorta to 2.6 cm. Reproductive: Uterus is surgically absent. No concerning adnexal lesions. Other: Mild circumferential body wall edema. No bowel containing hernia. Small volume low-attenuation free fluid tracking inferiorly to the deep pelvis, likely reactive. No free intraperitoneal air. Musculoskeletal: Postsurgical changes from prior L4-S1 posterior spinal fusion with transpedicular screws at the L4 and S1 levels and bony fusion across the posterior elements. Multilevel degenerative changes are present in the imaged portions of the spine. Adjacent segment disease noted at L3-4. Mild levocurvature of the lumbar spine. IMPRESSION: 1. Severe right hydronephrosis to the level of a 11 mm calculus at the right ureteropelvic junction. Extensive adjacent retroperitoneal stranding and inflammation. 2. Postsurgical changes at the tip of the cecum compatible with reported history of appendectomy however a short tubular structure extending from the cecal tip measuring 2.7 cm in diameter with a large 15 mm calcification at the base suggesting a dilated, hyperemic appendiceal stump. Minimal stranding is seen adjacent to this location. Findings are concerning for acute appendicitis. Recommend surgical consultation. Additionally given size of this dilation, a mucocele is not fully excluded. 3. Gradient density within the gallbladder lumen likely reflects biliary sludge. No visible calcified gallstones or gallbladder wall thickening. Borderline biliary ductal dilatation. If there is clinical concern for choledocholithiasis or acute cholecystitis, consider further evaluation with right upper quadrant ultrasound. 4. Mild fusiform ectasia of the infrarenal abdominal aorta to 2.6 cm. Ectatic abdominal aorta at risk for aneurysm development. Recommend followup by  ultrasound in 5 years. This recommendation follows ACR consensus guidelines: White Paper of the ACR Incidental Findings Committee II on Vascular Findings. J Am Coll Radiol 2013; 10:789-794. Aortic aneurysm NOS (ICD10-I71.9) 5. Circumferential body wall edema, trace pericardial fluid, and small volume of fluid in the deep pelvis. Features suggesting mild anasarca/volume third-spacing. 6. Aortic Atherosclerosis (ICD10-I70.0). 7. Prior L4-S1 posterior spinal fusion without hardware failure or loosening. These results were called by telephone at the time of interpretation on 12/13/2018 at 2:18 am to provider [facility] , who verbally acknowledged these results. Electronically Signed   By: Kreg Shropshire M.D.   On: 12/13/2018 02:27   Dg Chest 2 View  Result Date: 12/13/2018 CLINICAL DATA:  Acute renal failure EXAM: CHEST - 2 VIEW COMPARISON:  None. FINDINGS: Tiny bilateral pleural effusions. No consolidation. Normal heart size. Aortic atherosclerosis. No pneumothorax. IMPRESSION: Tiny bilateral pleural effusions. Electronically Signed   By: Jasmine Pang M.D.   On: 12/13/2018 01:52   US Abdomen Limited Ruq  Result Date: 12/13/2018 CLINICAL DATA:  Dilated CBD seen on same day CT EXAM: ULTRASOUND ABDOMEN LIMITED RIGHT UPPER QUADRANT COMPARISON:  CT 12/13/2018 FINDINGS: Gallbladder: No sonographically evident gallstones or biliary sludge. No gallbladder wall thickening. Sonographic Eulah Pont sign is reportedly negative per sonographer. Prominent fold at the neck of the gallbladder. Common bile duct: Diameter: 8.3 mm, dilated Liver: No focal lesion identified. Within normal limits in parenchymal echogenicity. Portal vein is patent on color Doppler imaging with normal direction of blood flow towards the liver. Other: None. IMPRESSION: Mild nonspecific extrahepatic biliary ductal dilatation without visible choledocholithiasis or biliary sludge. Recommend correlation with serologies and if there is persisting  clinical  concern for choledocholithiasis, MRCP could be obtained. Electronically Signed   By: Kreg Shropshire M.D.   On: 12/13/2018 03:26    I independently reviewed the above imaging studies.  Impression/Recommendation: 68 year old female presenting with obstructing right ureteral stone and what is functionally a solitary kidney given left atrophic kidney.  Expeditious right ureteral stent placement is indicated given obstruction of solitary kidney and severe AKI.  We anticipate that her potassium will have to downtrend with the current interventions prior to being approved by anesthesia.  We are also awaiting recommendations from our general surgery colleagues regarding her dilated biliary duct and possible appendicitis in appendiceal stump.  If general surgery is proceeding to the OR, then we will plan to join them as joint case.  If general surgery is not proceeding to the OR, then we will move forward with right ureteral stent placement as soon as possible.  Discussed with Dr. Retta Diones, on call urology attending.  I have reviewed the pt's history as well as CT/lab studies. I have interviewed her as well. I agree with the above assessment/plan. Will proceed with urgent stent placement. I have prepared her for postobstructive diuresis--will leave urethral catheter in.  Roxanne Gates 12/13/2018, 4:07 AM

## 2018-12-13 NOTE — Anesthesia Preprocedure Evaluation (Addendum)
Anesthesia Evaluation  Patient identified by MRN, date of birth, ID band Patient awake    Reviewed: Allergy & Precautions, NPO status , Patient's Chart, lab work & pertinent test results  History of Anesthesia Complications Negative for: history of anesthetic complications  Airway Mallampati: II  TM Distance: >3 FB Neck ROM: Full    Dental  (+) Dental Advisory Given, Edentulous Upper   Pulmonary former smoker,   Denies asthma    Pulmonary exam normal        Cardiovascular hypertension, Pt. on medications Normal cardiovascular exam     Neuro/Psych  Headaches, PSYCHIATRIC DISORDERS Anxiety  S/p ACDF     GI/Hepatic GERD  Poorly Controlled,  Endo/Other   Denies hypothyroidism and DM, no meds   Renal/GU CRFRenal disease     Musculoskeletal  (+) Arthritis , narcotic dependent  Abdominal   Peds  Hematology negative hematology ROS (+)   Anesthesia Other Findings Sjogren's syndrome   Reproductive/Obstetrics                            Anesthesia Physical Anesthesia Plan  ASA: III  Anesthesia Plan: General   Post-op Pain Management:    Induction: Intravenous and Rapid sequence  PONV Risk Score and Plan: 3 and Treatment may vary due to age or medical condition, Ondansetron and Dexamethasone  Airway Management Planned: Oral ETT  Additional Equipment: None  Intra-op Plan:   Post-operative Plan: Extubation in OR  Informed Consent: I have reviewed the patients History and Physical, chart, labs and discussed the procedure including the risks, benefits and alternatives for the proposed anesthesia with the patient or authorized representative who has indicated his/her understanding and acceptance.     Dental advisory given  Plan Discussed with: CRNA and Anesthesiologist  Anesthesia Plan Comments:        Anesthesia Quick Evaluation

## 2018-12-13 NOTE — Anesthesia Procedure Notes (Signed)
Procedure Name: Intubation Date/Time: 12/13/2018 8:04 AM Performed by: Demir Titsworth T, CRNA Pre-anesthesia Checklist: Patient identified, Emergency Drugs available, Suction available and Patient being monitored Patient Re-evaluated:Patient Re-evaluated prior to induction Oxygen Delivery Method: Circle system utilized Preoxygenation: Pre-oxygenation with 100% oxygen Induction Type: IV induction and Rapid sequence Ventilation: Mask ventilation without difficulty Laryngoscope Size: Miller and 2 Grade View: Grade I Tube type: Oral Tube size: 7.0 mm Number of attempts: 1 Airway Equipment and Method: Patient positioned with wedge pillow and Stylet Placement Confirmation: ETT inserted through vocal cords under direct vision,  positive ETCO2 and breath sounds checked- equal and bilateral Secured at: 21 cm Tube secured with: Tape Dental Injury: Teeth and Oropharynx as per pre-operative assessment

## 2018-12-13 NOTE — Consult Note (Signed)
Reason for Consult: Renal failure Referring Physician:  Dr. Clyde Lundborg  Chief Complaint:  Nausea and vomiting  Assessment/Plan: 1. Acute renal failure on CKDIIIA w/ BL cr ~1-1.2 - urinalysis does not show active sediment but will still send serologies which are most likely to be negative. AKI may be from prerenal azotemia vs ATN vs obstruction with nausea and vomiting intensifying over the past 2 days w/ progression of renal dysfunction. - Will send serologies and urine FeNA but appears that obstructive uropathy is highest on the differential. CT scan shows a very large stone at the rt ureteropelvic junction and right sided hydronephrosis w/ left sided atrophy which would explain why she developed renal failure with a unilateral obstruction. - Agree that a stent to relieve the obstruction is indicated o/w a PCN will be necessary. - We will follow closely and possibly offer acute iHD if the hyperkalemia precludes any intervention for possibly cholecystitis and stenting of the ureter but should be able to avoid RRT with stent placement. - Strict I&O's + daily weights - Continue holding Lisinopril. - On Lokelma.   2. Abdominal pain may be from cholecystitis +/- obstruction of the right ureter with a stone. 3. HTN 4. DM 5. Migraines 6. Sjogrens   HPI: Mary [name] is an 68 y.o. 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 She has not been taking much in PO in the form of solids as well as liquids. She has also had diarrhea as well with loose BM's. Of note she has been seen by ENT for the ataxia -> neurology with negative w/u at time of admission. She had a fever of 101.8 3 days prior to admission but currently denies any dyspnea, fevers, myalgias or joint pain. She does use  NSAID's but only ~1-2x / week. She has mild dysuria but no hematuria but no fullness in the bladder after voiding. CT in the ED revealed sludge in the gall bladder and also a 11 mm stone at the rt ureteropelvic junction with hydronephrosis and an atrophic left kidney. Also noted to be hyperkalemic with renal failure with a BUN/Cr of 90/11.5 and K of 6.2 which came down to 5.1 with therapy.  ROS Pertinent items are noted in HPI.  Chemistry and CBC: Creatinine, Ser  Date/Time Value Ref Range Status  12/12/2018 07:42 PM 11.15 (H) 0.44 - 1.00 mg/dL Final  56/21/3086 57:84 PM 1.13 (H) 0.57 - 1.00 mg/dL Final  69/62/9528 41:32 PM 0.93 0.44 - 1.00 mg/dL Final  44/02/270 53:66 PM 1.03 0.40 - 1.20 mg/dL Final  44/04/4740 59:56 PM 1.1 0.4 - 1.2 mg/dL Final  38/75/6433 29:51 PM 1.0 0.4 - 1.2 mg/dL Final  88/41/6606 30:16 PM 0.9 0.4 - 1.2 mg/dL Final  03/08/3233 57:32 PM 1.1 0.4 - 1.2 mg/dL Final  20/25/4270 62:37 PM 1.1 0.4 - 1.2 mg/dL Final  62/83/1517 61:60 AM 1.1 0.4 - 1.2 mg/dL Final   Recent Labs  Lab 12/12/18 1942  NA 134*  K 6.2*  CL 96*  CO2 14*  GLUCOSE 133*  BUN 87*  CREATININE 11.15*  CALCIUM 8.9   Recent Labs  Lab 12/12/18 1942  WBC 8.6  HGB 11.7*  HCT 36.5  MCV 90.1  PLT 311   Liver Function Tests: Recent Labs  Lab  12/12/18 1942  AST 20  ALT 13  ALKPHOS 74  BILITOT 4.3*  PROT 7.7  ALBUMIN 3.5   Recent Labs  Lab 12/12/18 1942  LIPASE 26   No results for input(s): AMMONIA in the last 168 hours. Cardiac Enzymes: Recent Labs  Lab 12/13/18 0422  CKTOTAL 198   Iron Studies: No results for input(s): IRON, TIBC, TRANSFERRIN, FERRITIN in the last 72 hours. PT/INR: @LABRCNTIP (inr:5)  Xrays/Other Studies: ) Results for orders placed or performed during the hospital encounter of 12/12/18 (from the past 48 hour(s))  Lipase, blood     Status: None   Collection Time: 12/12/18  7:42 PM  Result Value Ref Range   Lipase 26 11 - 51 U/L    Comment: Performed  at [facility], [address] [place], [place] [address]  Comprehensive metabolic panel     Status: Abnormal   Collection Time: 12/12/18  7:42 PM  Result Value Ref Range   Sodium 134 (L) 135 - 145 mmol/L   Potassium 6.2 (H) 3.5 - 5.1 mmol/L   Chloride 96 (L) 98 - 111 mmol/L   CO2 14 (L) 22 - 32 mmol/L   Glucose, Bld 133 (H) 70 - 99 mg/dL   BUN 87 (H) 8 - 23 mg/dL   Creatinine, Ser 19.1411.15 (H) 0.44 - 1.00 mg/dL   Calcium 8.9 8.9 - 78.210.3 mg/dL   Total Protein 7.7 6.5 - 8.1 g/dL   Albumin 3.5 3.5 - 5.0 g/dL   AST 20 15 - 41 U/L   ALT 13 0 - 44 U/L   Alkaline Phosphatase 74 38 - 126 U/L   Total Bilirubin 4.3 (H) 0.3 - 1.2 mg/dL   GFR calc non Af Amer 3 (L) >60 mL/min   GFR calc Af Amer 4 (L) >60 mL/min   Anion gap 24 (H) 5 - 15    Comment: Performed at [facility], [address] [place], [place] [address]  CBC     Status: Abnormal   Collection Time: 12/12/18  7:42 PM  Result Value Ref Range   WBC 8.6 4.0 - 10.5 K/uL   RBC 4.05 3.87 - 5.11 MIL/uL   Hemoglobin 11.7 (L) 12.0 - 15.0 g/dL   HCT 30.836.5 65.736.0 - 84.646.0 %   MCV 90.1 80.0 - 100.0 fL   MCH 28.9 26.0 - 34.0 pg   MCHC 32.1 30.0 - 36.0 g/dL   RDW 96.214.2 95.211.5 - 84.115.5 %   Platelets 311 150 - 400 K/uL   nRBC 0.0 0.0 - 0.2 %    Comment: Performed at [facility], [address] [place], [place] [address]  CBG monitoring, ED     Status: Abnormal   Collection Time: 12/13/18  3:02 AM  Result Value Ref Range   Glucose-Capillary 218 (H) 70 - 99 mg/dL  Protime-INR     Status: None   Collection Time: 12/13/18  3:18 AM  Result Value Ref Range   Prothrombin Time 14.7 11.4 - 15.2 seconds   INR 1.2 0.8 - 1.2    Comment: (NOTE) INR goal varies based on device and disease states. Performed at [facility], [address] [place], [place] [address]   APTT     Status: None   Collection Time: 12/13/18  3:18 AM  Result Value Ref Range   aPTT 36 24 - 36 seconds    Comment: Performed at [facility], [address] [place], [place] [address]  Type and screen [facility]     Status: None (Preliminary result)   Collection Time: 12/13/18  4:10 AM  Result Value Ref Range   ABO/RH(D) PENDING    Antibody Screen PENDING    Sample Expiration      12/16/2018,2359 Performed at [facility], [address], [place], [place] [address]   CBG monitoring, ED     Status: None   Collection Time: 12/13/18  4:13 AM  Result Value Ref Range   Glucose-Capillary 84 70 - 99 mg/dL  CK     Status: None   Collection Time: 12/13/18  4:22 AM  Result Value Ref Range   Total CK 198 38 - 234 U/L    Comment: Performed at [facility], [address] [place], [place] [address]  Rapid HIV screen (HIV 1/2 Ab+Ag)     Status: None   Collection Time: 12/13/18  4:22 AM  Result Value Ref Range   HIV-1 P24 Antigen - HIV24 NON REACTIVE NON REACTIVE    Comment: (NOTE) Detection of p24 may be inhibited by biotin in the sample, causing false negative results in acute infection.    HIV 1/2 Antibodies NON REACTIVE NON REACTIVE   Interpretation (HIV Ag Ab)      A non reactive test result means that HIV 1 or HIV 2 antibodies and HIV 1 p24 antigen were not detected in the specimen.    Comment: Performed at [facility], [address] [place], [place] [address]  CBG monitoring, ED     Status: Abnormal   Collection Time: 12/13/18  4:56 AM  Result Value Ref Range   Glucose-Capillary 66 (L) 70 - 99 mg/dL   Ct Abdomen Pelvis Wo Contrast  Result Date: 12/13/2018 CLINICAL DATA:  Abdominal distension with 6 weeks of intermittent nausea and vomiting EXAM: CT ABDOMEN AND PELVIS WITHOUT CONTRAST TECHNIQUE: Multidetector CT imaging of the abdomen and pelvis was performed following the standard protocol without IV contrast. COMPARISON:  None FINDINGS: Lower chest: Bandlike areas of opacity in both lung bases likely reflect subsegmental atelectasis and/or scarring. Hepatobiliary: No focal liver abnormality is  seen. Gradient density within the gallbladder lumen likely reflects biliary sludge. No visible calcified gallstones or gallbladder wall thickening. Common bile duct is borderline dilated at 8 mm. Pancreas: Fatty replacement of the pancreas. No pancreatic ductal dilatation or surrounding inflammatory changes. Spleen: Normal in size without focal abnormality. Adrenals/Urinary Tract: Normal adrenal glands. There is atrophy and extensive vascular calcification of the left kidney with a several fluid attenuation cysts. Additional hypoattenuating foci in the left kidney are too small to fully characterize on CT imaging but statistically likely benign. The right kidney is markedly enlarged, some of which may be compensatory hypertrophy however there is severe right hydronephrosis to the level of the 11 mm calculus at the right ureteropelvic junction. Distal ureter is more normal caliber. Urinary bladder is largely decompressed at the time of exam and therefore poorly evaluated by CT imaging. Stomach/Bowel: Distal esophagus, stomach and duodenal sweep are unremarkable. No small bowel wall thickening or dilatation. No evidence of obstruction. There are postsurgical changes at the tip of the cecum with several surgical clips however a short tubular structure extends from the cecal tip with a large 15 mm calcification at the base suggesting a dilated, hyperemic appendiceal stump. Minimal stranding is seen adjacent to this location. Remainder of the colon has a more normal appearance. No colonic dilatation or wall thickening. Vascular/Lymphatic:  Atherosclerotic plaque is seen throughout the aorta and branch vessels. Mild fusiform ectasia of the infrarenal abdominal aorta to 2.6 cm. Reproductive: Uterus is surgically absent. No concerning adnexal lesions. Other: Mild circumferential body wall edema. No bowel containing hernia. Small volume low-attenuation free fluid tracking inferiorly to the deep pelvis, likely reactive. No  free intraperitoneal air. Musculoskeletal: Postsurgical changes from prior L4-S1 posterior spinal fusion with transpedicular screws at the L4 and S1 levels and bony fusion across the posterior elements. Multilevel degenerative changes are present in the imaged portions of the spine. Adjacent segment disease noted at L3-4. Mild levocurvature of the lumbar spine. IMPRESSION: 1. Severe right hydronephrosis to the level of a 11 mm calculus at the right ureteropelvic junction. Extensive adjacent retroperitoneal stranding and inflammation. 2. Postsurgical changes at the tip of the cecum compatible with reported history of appendectomy however a short tubular structure extending from the cecal tip measuring 2.7 cm in diameter with a large 15 mm calcification at the base suggesting a dilated, hyperemic appendiceal stump. Minimal stranding is seen adjacent to this location. Findings are concerning for acute appendicitis. Recommend surgical consultation. Additionally given size of this dilation, a mucocele is not fully excluded. 3. Gradient density within the gallbladder lumen likely reflects biliary sludge. No visible calcified gallstones or gallbladder wall thickening. Borderline biliary ductal dilatation. If there is clinical concern for choledocholithiasis or acute cholecystitis, consider further evaluation with right upper quadrant ultrasound. 4. Mild fusiform ectasia of the infrarenal abdominal aorta to 2.6 cm. Ectatic abdominal aorta at risk for aneurysm development. Recommend followup by ultrasound in 5 years. This recommendation follows ACR consensus guidelines: White Paper of the ACR Incidental Findings Committee II on Vascular Findings. J Am Coll Radiol 2013; 10:789-794. Aortic aneurysm NOS (ICD10-I71.9) 5. Circumferential body wall edema, trace pericardial fluid, and small volume of fluid in the deep pelvis. Features suggesting mild anasarca/volume third-spacing. 6. Aortic Atherosclerosis (ICD10-I70.0). 7. Prior  L4-S1 posterior spinal fusion without hardware failure or loosening. These results were called by telephone at the time of interpretation on 12/13/2018 at 2:18 am to provider [facility] , who verbally acknowledged these results. Electronically Signed   By: Lovena Le M.D.   On: 12/13/2018 02:27   Dg Chest 2 View  Result Date: 12/13/2018 CLINICAL DATA:  Acute renal failure EXAM: CHEST - 2 VIEW COMPARISON:  None. FINDINGS: Tiny bilateral pleural effusions. No consolidation. Normal heart size. Aortic atherosclerosis. No pneumothorax. IMPRESSION: Tiny bilateral pleural effusions. Electronically Signed   By: Donavan Foil M.D.   On: 12/13/2018 01:52   US Abdomen Limited Ruq  Result Date: 12/13/2018 CLINICAL DATA:  Dilated CBD seen on same day CT EXAM: ULTRASOUND ABDOMEN LIMITED RIGHT UPPER QUADRANT COMPARISON:  CT 12/13/2018 FINDINGS: Gallbladder: No sonographically evident gallstones or biliary sludge. No gallbladder wall thickening. Sonographic Percell Miller sign is reportedly negative per sonographer. Prominent fold at the neck of the gallbladder. Common bile duct: Diameter: 8.3 mm, dilated Liver: No focal lesion identified. Within normal limits in parenchymal echogenicity. Portal vein is patent on color Doppler imaging with normal direction of blood flow towards the liver. Other: None. IMPRESSION: Mild nonspecific extrahepatic biliary ductal dilatation without visible choledocholithiasis or biliary sludge. Recommend correlation with serologies and if there is persisting clinical concern for choledocholithiasis, MRCP could be obtained. Electronically Signed   By: Lovena Le M.D.   On: 12/13/2018 03:26    PMH:   Past Medical History:  Diagnosis Date  . Allergy   . Arthritis   . Asthma   .  Chronic kidney disease   . Diabetes mellitus   . GERD (gastroesophageal reflux disease)   . Headache(784.0)   . High cholesterol   . Hypertension   . Migraines   . Sjogrens syndrome (HCC)   . Thyroid  disease     PSH:   Past Surgical History:  Procedure Laterality Date  . ABDOMINAL HYSTERECTOMY  1998  . ANTERIOR CERVICAL DECOMP/DISCECTOMY FUSION  2011  . APPENDECTOMY    . BREAST BIOPSY  1971  . KIDNEY STONE SURGERY  2011  . LUMBAR FUSION  2005  . TONSILLECTOMY  1971    Allergies:  Allergies  Allergen Reactions  . Sulfa Antibiotics Nausea And Vomiting  . Adhesive [Tape]   . Celebrex [Celecoxib]   . Lyrica [Pregabalin]   . Macrobid [Nitrofurantoin Macrocrystal]   . Penicillins   . Tribenzor [Olmesartan-Amlodipine-Hctz]     dizziness  . Tricor [Fenofibrate]   . Vioxx [Rofecoxib]     Generally not well while taking this  . Cephalexin     Other reaction(s): Abdominal Pain    Medications:   Prior to Admission medications   Medication Sig Start Date End Date Taking? Authorizing Provider  ALPRAZolam Prudy Feeler) 1 MG tablet For claustrophobia - only pre MRI. 11/28/18   Dohmeier, Porfirio Mylar, MD  butalbital-acetaminophen-caffeine (FIORICET, ESGIC) 50-325-40 MG tablet Take 1 tablet by mouth every 6 (six) hours as needed for headache. 01/19/15   Etta Grandchild, MD  cholecalciferol (VITAMIN D3) 25 MCG (1000 UT) tablet Take 1,000 Units by mouth daily.    [provider]  fluticasone (FLONASE) 50 MCG/ACT nasal spray Place 1 spray into both nostrils 2 (two) times daily.    [provider]  furosemide (LASIX) 20 MG tablet TAKE 1 BY MOUTH TWICE DAILY 01/19/15   Etta Grandchild, MD  hydrOXYzine (ATARAX/VISTARIL) 25 MG tablet Take 25 mg by mouth every 8 (eight) hours as needed.    [provider]  lidocaine (XYLOCAINE) 2 % solution Use as directed 20 mLs in the mouth or throat every 6 (six) hours as needed for mouth pain.  01/01/15   [provider]  lisinopril (ZESTRIL) 40 MG tablet  10/07/18   [provider]  Melatonin 3 MG TABS Take 1 tablet by mouth at bedtime as needed.    [provider]  methocarbamol (ROBAXIN) 500 MG tablet Take 2 tabs  four times daily    [provider]  morphine (MS CONTIN) 30 MG 12 hr tablet Take 30 mg by mouth every 8 (eight) hours.  08/25/11   [provider]  Multiple Vitamin (MULTIVITAMIN) tablet Take 1 tablet by mouth daily.    [provider]  oxyCODONE-acetaminophen (PERCOCET) 7.5-325 MG per tablet Take 1 tablet by mouth 3 (three) times daily as needed for severe pain.     [provider]  Polyethyl Glycol-Propyl Glycol (SYSTANE) 0.4-0.3 % SOLN Apply 1 drop to eye 3 times/day as needed-between meals & bedtime.    [provider]  zolpidem (AMBIEN CR) 6.25 MG CR tablet Take 6.25 mg by mouth at bedtime as needed for sleep.    [provider]    Discontinued Meds:   Medications Discontinued During This Encounter  Medication Reason  . fentaNYL (SUBLIMAZE) injection 50 mcg   . sodium zirconium cyclosilicate (LOKELMA) packet 10 g     Social History:  reports that she has quit smoking. She has never used smokeless tobacco. She reports that she does not drink alcohol or use drugs.  Family History:   Family History  Problem Relation Age of Onset  . Cancer Neg Hx   . Early death Neg Hx   . Heart disease Neg Hx   . Hyperlipidemia Neg Hx   . Hypertension Neg Hx   . Stroke Neg Hx     Blood pressure (!) 165/74, pulse 86, temperature 98.1 F (36.7 C), temperature source Oral, resp. rate 20, SpO2 98 %. General appearance: alert, cooperative and appears stated age Head: Normocephalic, without obvious abnormality, atraumatic Eyes: negative Neck: no adenopathy, no carotid bruit, no JVD, supple, symmetrical, trachea midline and thyroid not enlarged, symmetric, no tenderness/mass/nodules Back: symmetric, no curvature. ROM normal. No CVA tenderness. Resp: clear to auscultation bilaterally Chest wall: no tenderness Cardio: regular rate and rhythm GI: soft, non-tender; bowel sounds normal; no masses,  no organomegaly Extremities: edema tr Pulses: 2+ and  symmetric Skin: Skin color, texture, turgor normal. No rashes or lesions       Lynae Pederson, Len Blalock, MD 12/13/2018, 5:29 AM

## 2018-12-13 NOTE — H&P (Signed)
History and Physical    Mary [name] VZD:[id number] DOB: [birth year] DOA: 12/12/2018  Referring MD/NP/PA:   PCP: Lawerance Cruel, MD   Patient coming from:  The patient is coming from home.  At baseline, pt is independent for most of ADL.        Chief Complaint: Nausea, vomiting, diarrhea, abdominal pain, ataxia  HPI: Mary [name] is a 68 y.o. female with medical history significant of hypertension, hyperlipidemia, asthma, GERD, anxiety, Sjogren's syndrome, ataxic, chronic pain syndrome, CKD stage III, kidney stone (left kidney stone surgery 2008), who presents with nausea, vomiting, diarrhea, abdominal pain, ataxia.  Pt state that she has been having intermittent nausea, vomiting, diarrhea for more than 6 weeks.  She she has had nonbilious nonbloody vomitus 6 times today.  She has had 4-5 times of watery and loose stool bowel movement today.  Patient states that she has intermittent central abdominal pain. During the same period of time, patient has been having ataxia.  Described as unsteady gait particularly on turning around.  Patient denies unilateral weakness, numbness or tingling in the extremities.  No facial droop or slurred speech. She had seen ENT and neurology for ataxia with negative work-up thus far. They plan to do MRI of brain, but it is not done yet. She state that she had fever 101.8 three days ago.  Currently no fever or chills.  Patient denies chest pain or cough.  She states that sometimes she has some mild shortness of breath.  She denies symptoms of UTI, no hematuria.    ED Course: pt was found to have WBC 8.6, lipase 29, pending COVID-19 test, pending CK, potassium 6.2, bicarbonate of 14, creatinine 11.15, BUN 87 (creatinine 1.13 on 11/28/2018), liver function (ALP 74, AST 20, ALT 13, total bilirubin 4.3), temperature normal, blood pressure 165/74, heart rate 86, oxygen saturation 98% on room air.  Chest x-ray showed tiny bilateral pleural effusion. CT- abdomen/pelvis  showed left atrophic kidney, right hydronephrosis, right obstructing ureteral pelvic junction stone, inflammation of appendix stump, and biliary sludge and CBD dilation.  Patient is admitted to telemetry bed as inpatient.  ED physician consulted nephrology, Dr. Augustin Coupe, general surgeon, Dr. Barry Dienes nd urologist, Dr. Graylon Good.   Review of Systems:   General: had fevers, chills, no body weight gain, has poor appetite, has fatigue HEENT: no blurry vision, hearing changes or sore throat Respiratory: has dyspnea, no coughing, wheezing CV: no chest pain, no palpitations GI: has nausea, vomiting, abdominal pain, diarrhea, no constipation GU: no dysuria, burning on urination, increased urinary frequency, hematuria  Ext: no leg edema Neuro: no unilateral weakness, numbness, or tingling, no vision change or hearing loss Skin: no rash, no skin tear. MSK: No muscle spasm, no deformity, no limitation of range of movement in spin Heme: No easy bruising.  Travel history: No recent long distant travel.  Allergy:  Allergies  Allergen Reactions   Sulfa Antibiotics Nausea And Vomiting   Adhesive [Tape]    Celebrex [Celecoxib]    Lyrica [Pregabalin]    Macrobid [Nitrofurantoin Macrocrystal]    Penicillins    Tribenzor [Olmesartan-Amlodipine-Hctz]     dizziness   Tricor [Fenofibrate]    Vioxx [Rofecoxib]     Generally not well while taking this   Cephalexin     Other reaction(s): Abdominal Pain    Past Medical History:  Diagnosis Date   Allergy    Arthritis    Asthma    Chronic kidney disease    Diabetes mellitus    GERD (  gastroesophageal reflux disease)    Headache(784.0)    High cholesterol    Hypertension    Migraines    Sjogrens syndrome (HCC)    Thyroid disease     Past Surgical History:  Procedure Laterality Date   ABDOMINAL HYSTERECTOMY  1998   ANTERIOR CERVICAL DECOMP/DISCECTOMY FUSION  2011   APPENDECTOMY     BREAST BIOPSY  1971   KIDNEY STONE SURGERY   2011   LUMBAR FUSION  2005   TONSILLECTOMY  1971    Social History:  reports that she has quit smoking. She has never used smokeless tobacco. She reports that she does not drink alcohol or use drugs.  Family History:  Family History  Problem Relation Age of Onset   Cancer Neg Hx    Early death Neg Hx    Heart disease Neg Hx    Hyperlipidemia Neg Hx    Hypertension Neg Hx    Stroke Neg Hx      Prior to Admission medications   Medication Sig Start Date End Date Taking? Authorizing Provider  ALPRAZolam Prudy Feeler) 1 MG tablet For claustrophobia - only pre MRI. 11/28/18   Dohmeier, Porfirio Mylar, MD  butalbital-acetaminophen-caffeine (FIORICET, ESGIC) 50-325-40 MG tablet Take 1 tablet by mouth every 6 (six) hours as needed for headache. 01/19/15   Etta Grandchild, MD  cholecalciferol (VITAMIN D3) 25 MCG (1000 UT) tablet Take 1,000 Units by mouth daily.    [provider]  fluticasone (FLONASE) 50 MCG/ACT nasal spray Place 1 spray into both nostrils 2 (two) times daily.    [provider]  furosemide (LASIX) 20 MG tablet TAKE 1 BY MOUTH TWICE DAILY 01/19/15   Etta Grandchild, MD  hydrOXYzine (ATARAX/VISTARIL) 25 MG tablet Take 25 mg by mouth every 8 (eight) hours as needed.    [provider]  lidocaine (XYLOCAINE) 2 % solution Use as directed 20 mLs in the mouth or throat every 6 (six) hours as needed for mouth pain.  01/01/15   [provider]  lisinopril (ZESTRIL) 40 MG tablet  10/07/18   [provider]  Melatonin 3 MG TABS Take 1 tablet by mouth at bedtime as needed.    [provider]  methocarbamol (ROBAXIN) 500 MG tablet Take 2 tabs four times daily    [provider]  morphine (MS CONTIN) 30 MG 12 hr tablet Take 30 mg by mouth every 8 (eight) hours.  08/25/11   [provider]  Multiple Vitamin (MULTIVITAMIN) tablet Take 1 tablet by mouth daily.    [provider]  oxyCODONE-acetaminophen (PERCOCET) 7.5-325  MG per tablet Take 1 tablet by mouth 3 (three) times daily as needed for severe pain.     [provider]  Polyethyl Glycol-Propyl Glycol (SYSTANE) 0.4-0.3 % SOLN Apply 1 drop to eye 3 times/day as needed-between meals & bedtime.    [provider]  zolpidem (AMBIEN CR) 6.25 MG CR tablet Take 6.25 mg by mouth at bedtime as needed for sleep.    [provider]    Physical Exam: Vitals:   12/12/18 1858 12/12/18 1915 12/12/18 2225 12/13/18 0055  BP: (!) 124/98  138/67 (!) 165/74  Pulse: 87  74 86  Resp: Temp:  98.2 F (36.8 C)  98.1 F (36.7 C)  TempSrc:  Oral  Oral  SpO2: 100%  100% 98%   General: Not in acute distress HEENT:       Eyes: PERRL, EOMI, no scleral icterus.  ENT: No discharge from the ears and nose, no pharynx injection, no tonsillar enlargement.        Neck: No JVD, no bruit, no mass felt. Heme: No neck lymph node enlargement. Cardiac: S1/S2, RRR, No murmurs, No gallops or rubs. Respiratory: No rales, wheezing, rhonchi or rubs. GI: Soft, nondistended, has mild tenderness in central abdomen, no rebound pain, no organomegaly, BS present. GU: No hematuria Ext: No pitting leg edema bilaterally. 2+DP/PT pulse bilaterally. Musculoskeletal: No joint deformities, No joint redness or warmth, no limitation of ROM in spin. Skin: No rashes.  Neuro: Alert, oriented X3, cranial nerves II-XII grossly intact, moves all extremities normally.  Psych: Patient is not psychotic, no suicidal or hemocidal ideation.  Labs on Admission: I have personally reviewed following labs and imaging studies  CBC: Recent Labs  Lab 12/12/18 1942  WBC 8.6  HGB 11.7*  HCT 36.5  MCV 90.1  PLT 311   Basic Metabolic Panel: Recent Labs  Lab 12/12/18 1942  NA 134*  K 6.2*  CL 96*  CO2 14*  GLUCOSE 133*  BUN 87*  CREATININE 11.15*  CALCIUM 8.9   GFR: CrCl cannot be calculated (Unknown ideal weight.). Liver Function Tests: Recent Labs  Lab  12/12/18 1942  AST 20  ALT 13  ALKPHOS 74  BILITOT 4.3*  PROT 7.7  ALBUMIN 3.5   Recent Labs  Lab 12/12/18 1942  LIPASE 26   No results for input(s): AMMONIA in the last 168 hours. Coagulation Profile: Recent Labs  Lab 12/13/18 0318  INR 1.2   Cardiac Enzymes: Recent Labs  Lab 12/13/18 0422  CKTOTAL 198   BNP (last 3 results) No results for input(s): PROBNP in the last 8760 hours. HbA1C: No results for input(s): HGBA1C in the last 72 hours. CBG: Recent Labs  Lab 12/13/18 0302 12/13/18 0413 12/13/18 0456  GLUCAP 218* 84 66*   Lipid Profile: No results for input(s): CHOL, HDL, LDLCALC, TRIG, CHOLHDL, LDLDIRECT in the last 72 hours. Thyroid Function Tests: No results for input(s): TSH, T4TOTAL, FREET4, T3FREE, THYROIDAB in the last 72 hours. Anemia Panel: No results for input(s): VITAMINB12, FOLATE, FERRITIN, TIBC, IRON, RETICCTPCT in the last 72 hours. Urine analysis:    Component Value Date/Time   COLORURINE YELLOW 01/14/2015 1732   APPEARANCEUR CLEAR 01/14/2015 1732   LABSPEC 1.008 01/14/2015 1732   PHURINE 5.0 01/14/2015 1732   GLUCOSEU NEGATIVE 01/14/2015 1732   GLUCOSEU NEGATIVE 07/08/2013 1432   HGBUR NEGATIVE 01/14/2015 1732   BILIRUBINUR NEGATIVE 01/14/2015 1732   KETONESUR NEGATIVE 01/14/2015 1732   PROTEINUR NEGATIVE 01/14/2015 1732   UROBILINOGEN 0.2 07/08/2013 1432   NITRITE NEGATIVE 01/14/2015 1732   LEUKOCYTESUR SMALL (A) 01/14/2015 1732   Sepsis Labs: @LABRCNTIP (procalcitonin:4,lacticidven:4) )No results found for this or any previous visit (from the past 240 hour(s)).   Radiological Exams on Admission: Ct Abdomen Pelvis Wo Contrast  Result Date: 12/13/2018 CLINICAL DATA:  Abdominal distension with 6 weeks of intermittent nausea and vomiting EXAM: CT ABDOMEN AND PELVIS WITHOUT CONTRAST TECHNIQUE: Multidetector CT imaging of the abdomen and pelvis was performed following the standard protocol without IV contrast. COMPARISON:  None  FINDINGS: Lower chest: Bandlike areas of opacity in both lung bases likely reflect subsegmental atelectasis and/or scarring. Hepatobiliary: No focal liver abnormality is seen. Gradient density within the gallbladder lumen likely reflects biliary sludge. No visible calcified gallstones or gallbladder wall thickening. Common bile duct is borderline dilated at 8 mm. Pancreas: Fatty replacement of the pancreas. No pancreatic ductal dilatation or surrounding  inflammatory changes. Spleen: Normal in size without focal abnormality. Adrenals/Urinary Tract: Normal adrenal glands. There is atrophy and extensive vascular calcification of the left kidney with a several fluid attenuation cysts. Additional hypoattenuating foci in the left kidney are too small to fully characterize on CT imaging but statistically likely benign. The right kidney is markedly enlarged, some of which may be compensatory hypertrophy however there is severe right hydronephrosis to the level of the 11 mm calculus at the right ureteropelvic junction. Distal ureter is more normal caliber. Urinary bladder is largely decompressed at the time of exam and therefore poorly evaluated by CT imaging. Stomach/Bowel: Distal esophagus, stomach and duodenal sweep are unremarkable. No small bowel wall thickening or dilatation. No evidence of obstruction. There are postsurgical changes at the tip of the cecum with several surgical clips however a short tubular structure extends from the cecal tip with a large 15 mm calcification at the base suggesting a dilated, hyperemic appendiceal stump. Minimal stranding is seen adjacent to this location. Remainder of the colon has a more normal appearance. No colonic dilatation or wall thickening. Vascular/Lymphatic: Atherosclerotic plaque is seen throughout the aorta and branch vessels. Mild fusiform ectasia of the infrarenal abdominal aorta to 2.6 cm. Reproductive: Uterus is surgically absent. No concerning adnexal lesions. Other:  Mild circumferential body wall edema. No bowel containing hernia. Small volume low-attenuation free fluid tracking inferiorly to the deep pelvis, likely reactive. No free intraperitoneal air. Musculoskeletal: Postsurgical changes from prior L4-S1 posterior spinal fusion with transpedicular screws at the L4 and S1 levels and bony fusion across the posterior elements. Multilevel degenerative changes are present in the imaged portions of the spine. Adjacent segment disease noted at L3-4. Mild levocurvature of the lumbar spine. IMPRESSION: 1. Severe right hydronephrosis to the level of a 11 mm calculus at the right ureteropelvic junction. Extensive adjacent retroperitoneal stranding and inflammation. 2. Postsurgical changes at the tip of the cecum compatible with reported history of appendectomy however a short tubular structure extending from the cecal tip measuring 2.7 cm in diameter with a large 15 mm calcification at the base suggesting a dilated, hyperemic appendiceal stump. Minimal stranding is seen adjacent to this location. Findings are concerning for acute appendicitis. Recommend surgical consultation. Additionally given size of this dilation, a mucocele is not fully excluded. 3. Gradient density within the gallbladder lumen likely reflects biliary sludge. No visible calcified gallstones or gallbladder wall thickening. Borderline biliary ductal dilatation. If there is clinical concern for choledocholithiasis or acute cholecystitis, consider further evaluation with right upper quadrant ultrasound. 4. Mild fusiform ectasia of the infrarenal abdominal aorta to 2.6 cm. Ectatic abdominal aorta at risk for aneurysm development. Recommend followup by ultrasound in 5 years. This recommendation follows ACR consensus guidelines: White Paper of the ACR Incidental Findings Committee II on Vascular Findings. J Am Coll Radiol 2013; 10:789-794. Aortic aneurysm NOS (ICD10-I71.9) 5. Circumferential body wall edema, trace  pericardial fluid, and small volume of fluid in the deep pelvis. Features suggesting mild anasarca/volume third-spacing. 6. Aortic Atherosclerosis (ICD10-I70.0). 7. Prior L4-S1 posterior spinal fusion without hardware failure or loosening. These results were called by telephone at the time of interpretation on 12/13/2018 at 2:18 am to provider [facility] , who verbally acknowledged these results. Electronically Signed   By: Kreg Shropshire M.D.   On: 12/13/2018 02:27   Dg Chest 2 View  Result Date: 12/13/2018 CLINICAL DATA:  Acute renal failure EXAM: CHEST - 2 VIEW COMPARISON:  None. FINDINGS: Tiny bilateral pleural effusions. No consolidation. Normal  heart size. Aortic atherosclerosis. No pneumothorax. IMPRESSION: Tiny bilateral pleural effusions. Electronically Signed   By: Jasmine Pang M.D.   On: 12/13/2018 01:52   US Abdomen Limited Ruq  Result Date: 12/13/2018 CLINICAL DATA:  Dilated CBD seen on same day CT EXAM: ULTRASOUND ABDOMEN LIMITED RIGHT UPPER QUADRANT COMPARISON:  CT 12/13/2018 FINDINGS: Gallbladder: No sonographically evident gallstones or biliary sludge. No gallbladder wall thickening. Sonographic Eulah Pont sign is reportedly negative per sonographer. Prominent fold at the neck of the gallbladder. Common bile duct: Diameter: 8.3 mm, dilated Liver: No focal lesion identified. Within normal limits in parenchymal echogenicity. Portal vein is patent on color Doppler imaging with normal direction of blood flow towards the liver. Other: None. IMPRESSION: Mild nonspecific extrahepatic biliary ductal dilatation without visible choledocholithiasis or biliary sludge. Recommend correlation with serologies and if there is persisting clinical concern for choledocholithiasis, MRCP could be obtained. Electronically Signed   By: Kreg Shropshire M.D.   On: 12/13/2018 03:26     EKG: Independently reviewed.  Not done in ED, will get one.   Assessment/Plan Principal Problem:   Acute renal failure  superimposed on stage 3 chronic kidney disease (HCC) Active Problems:   Anxiety   Essential hypertension   Ataxia   Elevated bilirubin   Hyperkalemia   Nausea vomiting and diarrhea   Choledocholithiasis   Hydronephrosis with ureteropelvic junction obstruction (CODE)   Abdominal pain   Chronic pain syndrome   Acute renal failure superimposed on stage 3 chronic kidney disease (HCC): Baseline creatinine 1.013 on 11/28/2018.  Her creatinine is 11.5, BUN 87.  Most likely due to  hydronephrosis with ureteropelvic junction obstruction. Nephrogy Dr. Juel Burrow was consulted. Urology is planing to place stent.  -will admit to tele bed as inpt -IVF: 1L NS, then 75 cc/h -hold lasix and lisinopril  Abdominal pain: Due to multifactorial etiology. CT- abdomen/pelvis showed right hydronephrosis, right obstructing ureteral pelvic junction stone, inflammation of appendix stump, and biliary sludge and CBD dilation. Pt also has nausea, vomiting and diarrhea, will need to rule out infection. general surgeon, Dr. Donell Beers and urologist, Dr. Ilsa Iha were consulted -INR/PTT/type & screen -keep pt NPO now -As needed morphine for pain, Zofran for nausea -f/u surgeon's recommendations  Anxiety: -prn hydroxyzine  Essential hypertension: -prn IV labetalol -Hold Lasix and lisinopril due to worsening renal function -Start oral hydralazine 25 mg 3 times daily  Elevated bilirubin: AST and ALT normal. TB 4.3. CT showed  biliary sludge and CBD dilation. -f/u general surgeon's recommendation  Hyperkalemia: Potassium 6.2. No DVT kidney on EKG.  This is a due to worsening renal function. -treated with D50, 5 unit of Novolog by IV, 50 mEq of sodium bicarbonate plus 10 g of Lokelma -f/u by BMP  Ataxia: Etiology is not clear.  Patient is following up with outpatient neurologist.  Planning to do MRI of brain -Patient will needs MRI of the brain, should be done after surgery (Not ordered yet)  Nausea vomiting and diarrhea:  Etiology is not clear.  This issue has been going on for more than 6 weeks. -Check C. difficile PCR, GI pathogen panel -Supportive care -As needed Zofran for nausea   Inpatient status:  # Patient requires inpatient status due to high intensity of service, high risk for further deterioration and high frequency of surveillance required.  I certify that at the point of admission it is my clinical judgment that the patient will require inpatient hospital care spanning beyond 2 midnights from the point of admission.   This  patient has multiple chronic comorbidities including hypertension, hyperlipidemia, asthma, GERD, anxiety, Sjogren's syndrome, ataxic, chronic pain syndrome, CKD stage III, kidney stone (left kidney stone surgery 2008).  Now patient has presenting with nausea, vomiting, diarrhea, abdominal pain, ataxia, choledocholithiasis, hydronephrosis with ureteropelvic junction obstruction.  The worrisome physical exam findings include central abdominal tenderness  The initial radiographic and laboratory data are worrisome because:  CT- abdomen/pelvis showed left atrophic kidney, right hydronephrosis, right obstructing ureteral pelvic junction stone, inflammation of appendix stump, and biliary sludge and CBD dilation.  Patient also has hyperkalemia with potassium 6.2, severe worsening renal function, elevated total bilirubin.  Current medical needs: please see my assessment and plan  Predictability of an adverse outcome (risk): Patient has multiple comorbidities, now presents with multiple complicated issues as listed above.  Her presentation is extremely complicated.  Patient will need surgery from both the urologist and general surgeon.  Patient is at high risk for deteriorating.  Will need to be treated in hospital for at least 2 days.           DVT ppx: SCD Code Status: Full code Family Communication: Yes, patient's  husband  at bed side Disposition Plan:  Anticipate discharge  back to previous home environment Consults called:   Admission status:   Inpatient/tele   medical floor/obs     SDU/inpation         Date of Service 12/13/2018    Lorretta HarpXilin Jameisha Stofko [facility]   If 7PM-7AM, please contact night-coverage [email or web address] Password [email or web address] 12/13/2018, 5:20 AM

## 2018-12-13 NOTE — Consult Note (Signed)
Reason for Consult:abnormal appendix and gallbladder on imaging.   Referring Physician:  [name] is an 68 y.o. female.  HPI:  Pt is a 68 yo F who presents to the ED with 6 weeks of progressively worsening malaise and nausea.  She also had some intermittent mild abdominal pain.  She has a history of chronic back pain so couldn't really tell if back pain was new.  The pain has slowly gotten a little worse since she has been here.  She feels bloated.   She has acute kidney injury on her labs.  Her CT showed a large obstructing right UPJ stone with retroperitoneal stranding and inflammation.  Her creatinine is 11.  She has a contralateral atrophic kidney.   She also was seen to potentially have stump appendicitis, gallbladder sludge and a dilated common bile duct on CT.  She had an open appendectomy around 15 years ago in [place].  She denies fever/chills.   She is having jerky involuntary motions which is new.      Past Medical History:  Diagnosis Date  . Allergy   . Arthritis   . Asthma   . Chronic kidney disease   . Diabetes mellitus   . GERD (gastroesophageal reflux disease)   . Headache(784.0)   . High cholesterol   . Hypertension   . Migraines   . Sjogrens syndrome (HCC)   . Thyroid disease     Past Surgical History:  Procedure Laterality Date  . ABDOMINAL HYSTERECTOMY  1998  . ANTERIOR CERVICAL DECOMP/DISCECTOMY FUSION  2011  . APPENDECTOMY    . BREAST BIOPSY  1971  . KIDNEY STONE SURGERY  2011  . LUMBAR FUSION  2005  . TONSILLECTOMY  1971    Family History  Problem Relation Age of Onset  . Cancer Neg Hx   . Early death Neg Hx   . Heart disease Neg Hx   . Hyperlipidemia Neg Hx   . Hypertension Neg Hx   . Stroke Neg Hx     Social History:  reports that she has quit smoking. She has never used smokeless tobacco. She reports that she does not drink alcohol or use drugs.  Allergies:  Allergies  Allergen Reactions  . Sulfa Antibiotics Nausea And  Vomiting  . Adhesive [Tape]   . Celebrex [Celecoxib]   . Lyrica [Pregabalin]   . Macrobid [Nitrofurantoin Macrocrystal]   . Penicillins   . Tribenzor [Olmesartan-Amlodipine-Hctz]     dizziness  . Tricor [Fenofibrate]   . Vioxx [Rofecoxib]     Generally not well while taking this  . Cephalexin     Other reaction(s): Abdominal Pain    Medications:   ALPRAZolam (XANAX) 1 MG tablet    butalbital-acetaminophen-caffeine (FIORICET, ESGIC) 50-325-40 MG tablet    cholecalciferol (VITAMIN D3) 25 MCG (1000 UT) tablet    fluticasone (FLONASE) 50 MCG/ACT nasal spray    furosemide (LASIX) 20 MG tablet    hydrOXYzine (ATARAX/VISTARIL) 25 MG tablet    lidocaine (XYLOCAINE) 2 % solution    lisinopril (ZESTRIL) 40 MG tablet    Melatonin 3 MG TABS    methocarbamol (ROBAXIN) 500 MG tablet    morphine (MS CONTIN) 30 MG 12 hr tablet    Multiple Vitamin (MULTIVITAMIN) tablet    oxyCODONE-acetaminophen (PERCOCET) 7.5-325 MG per tablet    Polyethyl Glycol-Propyl Glycol (SYSTANE) 0.4-0.3 % SOLN    zolpidem (AMBIEN CR) 6.25 MG CR tablet      Results for orders placed or performed during  the hospital encounter of 12/12/18 (from the past 48 hour(s))  Lipase, blood     Status: None   Collection Time: 12/12/18  7:42 PM  Result Value Ref Range   Lipase 26 11 - 51 U/L    Comment: Performed at [facility], [address] [place], [place] [address]  Comprehensive metabolic panel     Status: Abnormal   Collection Time: 12/12/18  7:42 PM  Result Value Ref Range   Sodium 134 (L) 135 - 145 mmol/L   Potassium 6.2 (H) 3.5 - 5.1 mmol/L   Chloride 96 (L) 98 - 111 mmol/L   CO2 14 (L) 22 - 32 mmol/L   Glucose, Bld 133 (H) 70 - 99 mg/dL   BUN 87 (H) 8 - 23 mg/dL   Creatinine, Ser 60.4511.15 (H) 0.44 - 1.00 mg/dL   Calcium 8.9 8.9 - 40.910.3 mg/dL   Total Protein 7.7 6.5 - 8.1 g/dL   Albumin 3.5 3.5 - 5.0 g/dL   AST 20 15 - 41 U/L   ALT 13 0 - 44 U/L   Alkaline Phosphatase 74 38 - 126 U/L   Total Bilirubin  4.3 (H) 0.3 - 1.2 mg/dL   GFR calc non Af Amer 3 (L) >60 mL/min   GFR calc Af Amer 4 (L) >60 mL/min   Anion gap 24 (H) 5 - 15    Comment: Performed at [facility], [address] [place], [place] [address]  CBC     Status: Abnormal   Collection Time: 12/12/18  7:42 PM  Result Value Ref Range   WBC 8.6 4.0 - 10.5 K/uL   RBC 4.05 3.87 - 5.11 MIL/uL   Hemoglobin 11.7 (L) 12.0 - 15.0 g/dL   HCT 47.836.5 29.536.0 - 62.146.0 %   MCV 90.1 80.0 - 100.0 fL   MCH 28.9 26.0 - 34.0 pg   MCHC 32.1 30.0 - 36.0 g/dL   RDW 30.814.2 65.711.5 - 84.615.5 %   Platelets 311 150 - 400 K/uL   nRBC 0.0 0.0 - 0.2 %    Comment: Performed at [facility], [address] [place], [place] [address]  CBG monitoring, ED     Status: Abnormal   Collection Time: 12/13/18  3:02 AM  Result Value Ref Range   Glucose-Capillary 218 (H) 70 - 99 mg/dL  Protime-INR     Status: None   Collection Time: 12/13/18  3:18 AM  Result Value Ref Range   Prothrombin Time 14.7 11.4 - 15.2 seconds   INR 1.2 0.8 - 1.2    Comment: (NOTE) INR goal varies based on device and disease states. Performed at [facility], [address] [place], [place] [address]   APTT     Status: None   Collection Time: 12/13/18  3:18 AM  Result Value Ref Range   aPTT 36 24 - 36 seconds    Comment: Performed at [facility], [address] [place], [place] [address]  Type and screen [facility]     Status: None (Preliminary result)   Collection Time: 12/13/18  4:10 AM  Result Value Ref Range   ABO/RH(D) PENDING    Antibody Screen PENDING    Sample Expiration      12/16/2018,2359 Performed at [facility], [address] [place], [place] [address]   CBG monitoring, ED     Status: None   Collection Time: 12/13/18  4:13 AM  Result Value  Ref Range   Glucose-Capillary 84 70 - 99 mg/dL  CK     Status: None   Collection Time: 12/13/18  4:22 AM  Result Value Ref Range   Total CK 198 38 - 234 U/L    Comment: Performed  at [facility], [address], [place], [place] [address]  Rapid HIV screen (HIV 1/2 Ab+Ag)     Status: None   Collection Time: 12/13/18  4:22 AM  Result Value Ref Range   HIV-1 P24 Antigen - HIV24 NON REACTIVE NON REACTIVE    Comment: (NOTE) Detection of p24 may be inhibited by biotin in the sample, causing false negative results in acute infection.    HIV 1/2 Antibodies NON REACTIVE NON REACTIVE   Interpretation (HIV Ag Ab)      A non reactive test result means that HIV 1 or HIV 2 antibodies and HIV 1 p24 antigen were not detected in the specimen.    Comment: Performed at [facility], [address] [place], [place] [address]  CBG monitoring, ED     Status: Abnormal   Collection Time: 12/13/18  4:56 AM  Result Value Ref Range   Glucose-Capillary 66 (L) 70 - 99 mg/dL    Ct Abdomen Pelvis Wo Contrast  Result Date: 12/13/2018 CLINICAL DATA:  Abdominal distension with 6 weeks of intermittent nausea and vomiting EXAM: CT ABDOMEN AND PELVIS WITHOUT CONTRAST TECHNIQUE: Multidetector CT imaging of the abdomen and pelvis was performed following the standard protocol without IV contrast. COMPARISON:  None FINDINGS: Lower chest: Bandlike areas of opacity in both lung bases likely reflect subsegmental atelectasis and/or scarring. Hepatobiliary: No focal liver abnormality is seen. Gradient density within the gallbladder lumen likely reflects biliary sludge. No visible calcified gallstones or gallbladder wall thickening. Common bile duct is borderline dilated at 8 mm. Pancreas: Fatty replacement of the pancreas. No pancreatic ductal dilatation or surrounding inflammatory changes. Spleen: Normal in size without focal abnormality. Adrenals/Urinary Tract: Normal adrenal glands. There is atrophy and extensive vascular calcification of the left kidney with a several fluid attenuation cysts. Additional hypoattenuating foci in the left kidney are too small to fully characterize on CT imaging but  statistically likely benign. The right kidney is markedly enlarged, some of which may be compensatory hypertrophy however there is severe right hydronephrosis to the level of the 11 mm calculus at the right ureteropelvic junction. Distal ureter is more normal caliber. Urinary bladder is largely decompressed at the time of exam and therefore poorly evaluated by CT imaging. Stomach/Bowel: Distal esophagus, stomach and duodenal sweep are unremarkable. No small bowel wall thickening or dilatation. No evidence of obstruction. There are postsurgical changes at the tip of the cecum with several surgical clips however a short tubular structure extends from the cecal tip with a large 15 mm calcification at the base suggesting a dilated, hyperemic appendiceal stump. Minimal stranding is seen adjacent to this location. Remainder of the colon has a more normal appearance. No colonic dilatation or wall thickening. Vascular/Lymphatic: Atherosclerotic plaque is seen throughout the aorta and branch vessels. Mild fusiform ectasia of the infrarenal abdominal aorta to 2.6 cm. Reproductive: Uterus is surgically absent. No concerning adnexal lesions. Other: Mild circumferential body wall edema. No bowel containing hernia. Small volume low-attenuation free fluid tracking inferiorly to the deep pelvis, likely reactive. No free intraperitoneal air. Musculoskeletal: Postsurgical changes from prior L4-S1 posterior spinal fusion with transpedicular screws at the L4 and S1 levels and bony fusion across the posterior elements.  Multilevel degenerative changes are present in the imaged portions of the spine. Adjacent segment disease noted at L3-4. Mild levocurvature of the lumbar spine. IMPRESSION: 1. Severe right hydronephrosis to the level of a 11 mm calculus at the right ureteropelvic junction. Extensive adjacent retroperitoneal stranding and inflammation. 2. Postsurgical changes at the tip of the cecum compatible with reported history of  appendectomy however a short tubular structure extending from the cecal tip measuring 2.7 cm in diameter with a large 15 mm calcification at the base suggesting a dilated, hyperemic appendiceal stump. Minimal stranding is seen adjacent to this location. Findings are concerning for acute appendicitis. Recommend surgical consultation. Additionally given size of this dilation, a mucocele is not fully excluded. 3. Gradient density within the gallbladder lumen likely reflects biliary sludge. No visible calcified gallstones or gallbladder wall thickening. Borderline biliary ductal dilatation. If there is clinical concern for choledocholithiasis or acute cholecystitis, consider further evaluation with right upper quadrant ultrasound. 4. Mild fusiform ectasia of the infrarenal abdominal aorta to 2.6 cm. Ectatic abdominal aorta at risk for aneurysm development. Recommend followup by ultrasound in 5 years. This recommendation follows ACR consensus guidelines: White Paper of the ACR Incidental Findings Committee II on Vascular Findings. J Am Coll Radiol 2013; 10:789-794. Aortic aneurysm NOS (ICD10-I71.9) 5. Circumferential body wall edema, trace pericardial fluid, and small volume of fluid in the deep pelvis. Features suggesting mild anasarca/volume third-spacing. 6. Aortic Atherosclerosis (ICD10-I70.0). 7. Prior L4-S1 posterior spinal fusion without hardware failure or loosening. These results were called by telephone at the time of interpretation on 12/13/2018 at 2:18 am to provider [facility] , who verbally acknowledged these results. Electronically Signed   By: Lovena Le M.D.   On: 12/13/2018 02:27   Dg Chest 2 View  Result Date: 12/13/2018 CLINICAL DATA:  Acute renal failure EXAM: CHEST - 2 VIEW COMPARISON:  None. FINDINGS: Tiny bilateral pleural effusions. No consolidation. Normal heart size. Aortic atherosclerosis. No pneumothorax. IMPRESSION: Tiny bilateral pleural effusions. Electronically Signed   By:  Donavan Foil M.D.   On: 12/13/2018 01:52   US Abdomen Limited Ruq  Result Date: 12/13/2018 CLINICAL DATA:  Dilated CBD seen on same day CT EXAM: ULTRASOUND ABDOMEN LIMITED RIGHT UPPER QUADRANT COMPARISON:  CT 12/13/2018 FINDINGS: Gallbladder: No sonographically evident gallstones or biliary sludge. No gallbladder wall thickening. Sonographic Percell Miller sign is reportedly negative per sonographer. Prominent fold at the neck of the gallbladder. Common bile duct: Diameter: 8.3 mm, dilated Liver: No focal lesion identified. Within normal limits in parenchymal echogenicity. Portal vein is patent on color Doppler imaging with normal direction of blood flow towards the liver. Other: None. IMPRESSION: Mild nonspecific extrahepatic biliary ductal dilatation without visible choledocholithiasis or biliary sludge. Recommend correlation with serologies and if there is persisting clinical concern for choledocholithiasis, MRCP could be obtained. Electronically Signed   By: Lovena Le M.D.   On: 12/13/2018 03:26    Review of Systems  Constitutional: Negative.   HENT: Negative.   Eyes: Negative.   Respiratory: Negative.   Cardiovascular: Negative.   Gastrointestinal: Positive for abdominal pain and nausea.  Genitourinary:       Decreased urine output  Musculoskeletal: Positive for back pain.  Skin: Negative.   Neurological: Positive for tremors (jerky motions).  Endo/Heme/Allergies: Negative.   Psychiatric/Behavioral: Negative.    Blood pressure (!) 165/74, pulse 86, temperature 98.1 F (36.7 C), temperature source Oral, resp. rate 20, SpO2 98 %. Physical Exam  Constitutional: She is oriented to person, place, and time. She  appears well-developed and well-nourished. She appears distressed.  HENT:  Head: Normocephalic and atraumatic.  Right Ear: External ear normal.  Left Ear: External ear normal.  Mouth/Throat: Oropharynx is clear and moist.  NGT in place with clear mucous output  Eyes: Pupils are  equal, round, and reactive to light. Conjunctivae are normal. No scleral icterus.  Neck: Normal range of motion. Neck supple. No tracheal deviation present. No thyromegaly present.  Cardiovascular: Normal rate, regular rhythm, normal heart sounds and intact distal pulses. Exam reveals no gallop and no friction rub.  No murmur heard. Respiratory: Effort normal and breath sounds normal. No respiratory distress. She has no wheezes.  GI: Soft. She exhibits distension. She exhibits no mass. There is abdominal tenderness (mild RUQ tenderness, very slight RLQ tenderness). There is no rebound and no guarding.  Lymphadenopathy:    She has no cervical adenopathy.  Neurological: She is alert and oriented to person, place, and time.  ? Asterixis.  Involuntary jerking motions. no pattern.  Mental status clear while they are occurring.    Skin: Skin is warm and dry. No rash noted. She is not diaphoretic. No erythema. There is pallor.  Psychiatric: She has a normal mood and affect. Her behavior is normal. Judgment and thought content normal.    Assessment/Plan: Abdominal pain Nausea Enlarged appendiceal stump Sl dilated common bile duct RUQ pain. Obstructing UPJ stone with acute kidney injury in setting of contralateral atrophic kidney Hyperkalemia  The renal stone may be the cause of all of her symptoms.   I recommend urology proceed with stent as planned.  I would wait to see what happens with her nausea and bloating after urologic obstruction is improved and renal parameters are better.    She certainly may have a mucocele of the appendix in which case we would typically want to get a colonoscopy non acutely to evaluate the appendiceal orifice.    Given her overall condition, it is prudent to place her on antibiotics while these issues are being sorted out.    Nephrology is seeing patient to address aki. Almond Lint 12/13/2018, 5:14 AM

## 2018-12-13 NOTE — Anesthesia Postprocedure Evaluation (Signed)
Anesthesia Post Note  Patient: Mary [name]  Procedure(s) Performed: CYSTOSCOPY WITH URETERAL STENT PLACEMENT (Right )     Patient location during evaluation: PACU Anesthesia Type: General Level of consciousness: awake and alert Pain management: pain level controlled Vital Signs Assessment: post-procedure vital signs reviewed and stable Respiratory status: spontaneous breathing, nonlabored ventilation and respiratory function stable Cardiovascular status: blood pressure returned to baseline and stable Postop Assessment: no apparent nausea or vomiting Anesthetic complications: no    Last Vitals:  Vitals:   12/13/18 1013 12/13/18 1015  BP: 133/62   Pulse: 72   Resp: 19   Temp:  36.4 C  SpO2: 100%                   Audry Pili

## 2018-12-13 NOTE — ED Notes (Signed)
Report given to short stay.  

## 2018-12-13 NOTE — Progress Notes (Signed)
PROGRESS NOTE  Mary [name] ALP:[id number] DOB: 1950-03-20 DOA: 12/12/2018 PCP: Lawerance Cruel, MD  Brief History   Mary [name] is a 68 y.o. female with medical history significant of hypertension, hyperlipidemia, asthma, GERD, anxiety, Sjogren's syndrome, ataxic, chronic pain syndrome, CKD stage III, kidney stone (left kidney stone surgery 2008), who presents with nausea, vomiting, diarrhea, abdominal pain, ataxia.  Pt state that she has been having intermittent nausea, vomiting, diarrhea for more than 6 weeks.  She she has had nonbilious nonbloody vomitus 6 times today.  She has had 4-5 times of watery and loose stool bowel movement today.  Patient states that she has intermittent central abdominal pain. During the same period of time, patient has been having ataxia.  Described as unsteady gait particularly on turning around.  Patient denies unilateral weakness, numbness or tingling in the extremities.  No facial droop or slurred speech. She had seen ENT and neurology for ataxia with negative work-up thus far. They plan to do MRI of brain, but it is not done yet. She state that she had fever 101.8 three days ago.  Currently no fever or chills.  Patient denies chest pain or cough.  She states that sometimes she has some mild shortness of breath.  She denies symptoms of UTI, no hematuria.    ED Course: pt was found to have WBC 8.6, lipase 29, pending COVID-19 test, pending CK, potassium 6.2, bicarbonate of 14, creatinine 11.15, BUN 87 (creatinine 1.13 on 11/28/2018), liver function (ALP 74, AST 20, ALT 13, total bilirubin 4.3), temperature normal, blood pressure 165/74, heart rate 86, oxygen saturation 98% on room air.  Chest x-ray showed tiny bilateral pleural effusion. CT- abdomen/pelvis showed left atrophic kidney, right hydronephrosis, right obstructing ureteral pelvic junction stone, inflammation of appendix stump, and biliary sludge and CBD dilation.  Patient is admitted to telemetry  bed as inpatient.  ED physician consulted nephrology, Dr. Augustin Coupe, general surgeon, Dr. Barry Dienes nd urologist, Dr. Graylon Good.  Consultants  Urology Nephrology  Procedures  . Cystoscopy with stent placement at UPJ  Antibiotics   Anti-infectives (From admission, onward)   Start     Dose/Rate Route Frequency Ordered Stop   12/13/18 1800  ceFEPIme (MAXIPIME) 1 g in sodium chloride 0.9 % 100 mL IVPB     1 g 200 mL/hr over 30 Minutes Intravenous Every 24 hours 12/13/18 1739     12/13/18 0815  ciprofloxacin (CIPRO) IVPB 400 mg     400 mg 200 mL/hr over 60 Minutes Intravenous To Surgery 12/13/18 0811 12/13/18 0816    .  Subjective  The patient is awake and alert and eating dinner. She doesn't know why she is here.  Objective   Vitals:  Vitals:   12/13/18 1208 12/13/18 1255  BP:  (!) 131/58  Pulse:  70  Resp:    Temp: 97.9 F (36.6 C) 98.5 F (36.9 C)  SpO2:  100%    Exam:  Constitutional:  . The patient is awake, alert, and oriented x 3. No acute distress. Respiratory:  . No increased work of breathing. . No wheezes, rales, or rhonchi . No tactile fremitus Cardiovascular:  . Regular rate and rhythm . No murmurs, ectopy, or gallups. . No lateral PMI. No thrills. Abdomen:  . Abdomen is soft, non-tender, non-distended . No hernias, masses, or organomegaly . Normoactive bowel sounds.  Musculoskeletal:  . No cyanosis, clubbing, or edema Skin:  . No rashes, lesions, ulcers . palpation of skin: no induration or nodules Neurologic:  . CN  2-12 intact . Sensation all 4 extremities intact Psychiatric:  . Mental status o Mood, affect appropriate o Patient is confused.  I have personally reviewed the following:   Today's Data  . Vitals, BMP, CBC  Micro Data  . Urine cultures and sensitivities.  Scheduled Meds: . cholecalciferol  1,000 Units Oral Daily  . fluticasone  1 spray Each Nare BID  . hydrALAZINE  25 mg Oral Q8H  . multivitamin with minerals  1 tablet Oral  Daily  . sodium bicarbonate  50 mEq Intravenous Once  . sodium chloride flush  3 mL Intravenous Once  . sodium zirconium cyclosilicate  10 g Oral Once   Continuous Infusions: . sodium chloride    . ceFEPime (MAXIPIME) IV      Principal Problem:   Acute renal failure superimposed on stage 3 chronic kidney disease (HCC) Active Problems:   Anxiety   Essential hypertension   Ataxia   Elevated bilirubin   Hyperkalemia   Nausea vomiting and diarrhea   Choledocholithiasis   Hydronephrosis with ureteropelvic junction obstruction (CODE)   Abdominal pain   Chronic pain syndrome   LOS: 0 days   A & P  Acute renal failure superimposed on stage 3 chronic kidney disease (HCC): Baseline creatinine 1.013 on 11/28/2018.  Her creatinine is 9.54 this morning , down from 11.5 on admission. Most likely due to  hydronephrosis with ureteropelvic junction obstruction. Nephrogy Dr. Juel Burrow was consulted. Continue IV Fluid. Continue to hold lasix and lisinopril.  Pyelonephritis: E. Coli and E. Faecalis grown from urine culture. Cefepime initiated.  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 Lynnae Sandhoff and a stent was placed in her right UPJ to clear obstruction.   Abdominal pain: Due to multifactorial etiology. CT- abdomen/pelvis showed right hydronephrosis, right obstructing ureteral pelvic junction stone, inflammation of appendix stump, and biliary sludge and CBD dilation. Pt also has nausea, vomiting and diarrhea, will need to rule out infection. general surgeon, Dr. Donell Beers and urologist, Dr. Ilsa Iha were consulted. Pain control and monitor abdomen. Consult gastroenterology for possible colonoscopy for inflammed mucocele of the appendiceal stump and ERCP.   Anxiety: prn hydroxyzine  Essential hypertension: As needed IV labetalol available. Oral hydralazine has been initiated. Continue to hold Lasix and lisinopril due to poor renal function  Elevated bilirubin: AST  and ALT normal. TB 4.3. CT showed  biliary sludge and CBD dilation. Monitor and consult GI for possible ERCP when patient has recovered from pyelonephritis.  Hyperkalemia: Potassium 6.2. No DVT kidney on EKG.  This is a due to worsening renal function. Will give Lokelma and IV fluid. Continue to monitor electrolytes.  Ataxia: Etiology is not clear.  Patient is following up with outpatient neurologist.  Planning to do MRI of brain. Patient will needs MRI of the brain, should be done after surgery (Not ordered yet)  Nausea vomiting and diarrhea: Etiology is not clear.  This issue has been going on for more than 6 weeks. Patient denies current diarrhea. She is eating dinner without difficulty. Monitor.  I have seen and examined this patient myself. I have spent 35 minutes in her evaluation and care.   Sharde Gover, DO [facility] Direct contact: see [email or web address]  7PM-7AM contact night coverage as above 12/13/2018, 5:50 PM  LOS: 0 days

## 2018-12-13 NOTE — Progress Notes (Signed)
Pharmacy Antibiotic Note  Mary [name] is a 68 y.o. female admitted on 12/12/2018 with UTI.  Pharmacy has been consulted for Cefepime dosing.  Plan: Cefepime 1 gram iv Q 24 hours Pharmacy to sign off and follow peripherally for changes in renal function     Temp (24hrs), Avg:98 F (36.7 C), Min:97.6 F (36.4 C), Max:98.5 F (36.9 C)  Recent Labs  Lab 12/12/18 1942 12/13/18 0422 12/13/18 1339  WBC 8.6  --  7.8  CREATININE 11.15* 11.50* 9.54*    CrCl cannot be calculated (Unknown ideal weight.).    Allergies  Allergen Reactions  . Sulfa Antibiotics Nausea And Vomiting  . Adhesive [Tape]   . Celebrex [Celecoxib]   . Lyrica [Pregabalin]   . Macrobid [Nitrofurantoin Macrocrystal]   . Penicillins   . Tribenzor [Olmesartan-Amlodipine-Hctz]     dizziness  . Tricor [Fenofibrate]   . Vioxx [Rofecoxib]     Generally not well while taking this  . Cephalexin     Other reaction(s): Abdominal Pain      Thank you for allowing pharmacy to be a part of this patient's care.  Tad Moore 12/13/2018 5:43 PM

## 2018-12-13 NOTE — ED Provider Notes (Addendum)
[facility] Provider Note   CSN: [id number] Arrival date & time: 12/12/18  1854     History   Chief Complaint Chief Complaint  Patient presents with   Emesis   Dizziness    HPI Mary [name] is a 68 y.o. female who presents today for evaluation of generally feeling unwell.  She reports that over the past 6 weeks she has had multiple worsening symptoms.  This initially started when she [place] and she became dizzy with a headache and was nauseous and vomited.  Once they returned she felt better and when they went back up she had symptoms again.  She states that her husband did not have any similar symptoms.  States they have a carbon monoxide detector in the cabin.  She denies any tick bites.  She had a telemetry visit with her primary care doctor who sent her to ear nose and throat who sent her to neurology.  At this time she is reportedly awaiting a MRI of her head.  She reports that over the past week she has felt worse.  Her nausea has worsened.  She states that she has had 3-4 episodes of vomiting and diarrhea over the past 24 hours.  She states 1 week ago she did have a fever for 1.5 days at 101-102.  She reports that earlier today she had abdominal pain for approximately 1 minute.  She states that she has been feeling abdominal bloating with swelling in her hands and feet worsening especially over the past day.  She denies taking large amounts of ibuprofen.  She states that her neurologist gave her hydroxyzine however denies any other new medications or dose change in medications.  She is unsure the last time she urinated.      HPI  Past Medical History:  Diagnosis Date   Allergy    Arthritis    Asthma    Chronic kidney disease    Diabetes mellitus    GERD (gastroesophageal reflux disease)    Headache(784.0)    High cholesterol    Hypertension    Migraines     Sjogrens syndrome (HCC)    Thyroid disease     Patient Active Problem List   Diagnosis Date Noted   Acute renal failure superimposed on stage 3 chronic kidney disease (HCC) 12/13/2018   Elevated bilirubin 12/13/2018   Hyperkalemia 12/13/2018   Nausea vomiting and diarrhea 12/13/2018   Choledocholithiasis 12/13/2018   Hydronephrosis with ureteropelvic junction obstruction (CODE) 12/13/2018   Abdominal pain 12/13/2018   Chronic pain syndrome 12/13/2018   Dilated cbd, acquired    Essential hypertension 11/28/2018   Abnormal vision 11/28/2018   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) 11/28/2018   Vertigo, aural, left 11/28/2018   Ataxia 11/28/2018   Cerebellar ataxia in diseases classified elsewhere (HCC) 11/28/2018   Altered level of consciousness 01/14/2015   Pure hyperglyceridemia 01/31/2013   Osteopenia 01/16/2012   Hyperglycemia 01/16/2012   Back pain, chronic 08/11/2011   Narcolepsy 08/11/2011   Allergic rhinitis, cause unspecified 06/30/2011   Sjogren's disease (HCC) 06/30/2011   Essential hypertension, benign 06/30/2011   Tension headache, chronic 06/30/2011   Pure hypercholesterolemia 06/30/2011   Parasomnia 06/30/2011   Anxiety 06/30/2011   Other screening mammogram 06/30/2011    Past Surgical History:  Procedure Laterality Date   ABDOMINAL HYSTERECTOMY  1998   ANTERIOR CERVICAL DECOMP/DISCECTOMY FUSION  2011  APPENDECTOMY     BREAST BIOPSY  1971   KIDNEY STONE SURGERY  2011   LUMBAR FUSION  2005   TONSILLECTOMY  1971     OB History   No obstetric history on file.      Home Medications    Prior to Admission medications   Medication Sig Start Date End Date Taking? Authorizing Provider  butalbital-acetaminophen-caffeine (FIORICET, ESGIC) 50-325-40 MG tablet Take 1 tablet by mouth every 6 (six) hours as needed for headache. 01/19/15  Yes Etta Grandchild, MD    cholecalciferol (VITAMIN D3) 25 MCG (1000 UT) tablet Take 1,000 Units by mouth daily.   Yes [provider]  fluticasone (FLONASE) 50 MCG/ACT nasal spray Place 1 spray into both nostrils 2 (two) times daily.   Yes [provider]  furosemide (LASIX) 20 MG tablet TAKE 1 BY MOUTH TWICE DAILY Patient taking differently: Take 20 mg by mouth 2 (two) times daily.  01/19/15  Yes Etta Grandchild, MD  hydrOXYzine (ATARAX/VISTARIL) 25 MG tablet Take 25 mg by mouth every 8 (eight) hours as needed for anxiety.    Yes [provider]  lisinopril (ZESTRIL) 40 MG tablet Take 40 mg by mouth daily.  10/07/18  Yes [provider]  Melatonin 3 MG TABS Take 3 mg by mouth at bedtime as needed (for sleep).    Yes [provider]  methocarbamol (ROBAXIN) 500 MG tablet Take 1,000 mg by mouth 4 (four) times daily.    Yes [provider]  morphine (MS CONTIN) 30 MG 12 hr tablet Take 30 mg by mouth every 8 (eight) hours.  08/25/11  Yes [provider]  Multiple Vitamin (MULTIVITAMIN) tablet Take 1 tablet by mouth daily.   Yes [provider]  oxyCODONE-acetaminophen (PERCOCET) 7.5-325 MG per tablet Take 1 tablet by mouth 3 (three) times daily as needed for severe pain.    Yes [provider]    Family History Family History  Problem Relation Age of Onset   Cancer Neg Hx    Early death Neg Hx    Heart disease Neg Hx    Hyperlipidemia Neg Hx    Hypertension Neg Hx    Stroke Neg Hx     Social History Social History   Tobacco Use   Smoking status: Former Smoker   Smokeless tobacco: Never Used  Substance Use Topics   Alcohol use: No   Drug use: No     Allergies   Sulfa antibiotics, Adhesive [tape], Celebrex [celecoxib], Lyrica [pregabalin], Macrobid [nitrofurantoin macrocrystal], Penicillins, Tribenzor [olmesartan-amlodipine-hctz], Tricor [fenofibrate], Vioxx [rofecoxib], and Cephalexin   Review of Systems Review of  Systems  Constitutional: Positive for fatigue. Negative for chills and fever.  Eyes: Negative for visual disturbance.  Respiratory: Negative for chest tightness and shortness of breath.   Cardiovascular: Positive for leg swelling. Negative for chest pain.  Gastrointestinal: Positive for abdominal distention, abdominal pain, diarrhea, nausea and vomiting. Negative for blood in stool.  Genitourinary: Positive for decreased urine volume. Negative for dysuria, flank pain, frequency and urgency.  Musculoskeletal:       Chronic, unchanged back and neck pain.  Neurological: Positive for dizziness. Negative for weakness and headaches.  Psychiatric/Behavioral: Negative for confusion.  All other systems reviewed and are negative.    Physical Exam Updated Vital Signs BP (!) 165/74    Pulse 86    Temp 98.1 F (36.7 C) (Oral)    Resp 20    SpO2 98%   Physical Exam Vitals  signs and nursing note reviewed.  Constitutional:      Appearance: She is well-developed. She is ill-appearing.  HENT:     Head: Normocephalic and atraumatic.     Nose: Nose normal.     Mouth/Throat:     Mouth: Mucous membranes are dry.  Eyes:     Conjunctiva/sclera: Conjunctivae normal.  Neck:     Musculoskeletal: Normal range of motion and neck supple. No neck rigidity.  Cardiovascular:     Rate and Rhythm: Normal rate and regular rhythm.     Pulses: Normal pulses.     Heart sounds: Normal heart sounds. No murmur.     Comments: 2+ radial, DP, PT pulses bilaterally.  +1 edema to bilateral ankles. Pulmonary:     Effort: Pulmonary effort is normal. No respiratory distress.     Breath sounds: Normal breath sounds.  Abdominal:     General: Bowel sounds are normal. There is distension.     Palpations: Abdomen is soft. There is no mass.     Tenderness: There is no abdominal tenderness. There is no guarding or rebound.     Hernia: No hernia is present.  Skin:    General: Skin is warm and dry.  Neurological:     Mental  Status: She is alert and oriented to person, place, and time. Mental status is at baseline.     Comments: Tremulous  Psychiatric:     Comments: Anxious      ED Treatments / Results  Labs (all labs ordered are listed, but only abnormal results are displayed) Labs Reviewed  COMPREHENSIVE METABOLIC PANEL - Abnormal; Notable for the following components:      Result Value   Sodium 134 (*)    Potassium 6.2 (*)    Chloride 96 (*)    CO2 14 (*)    Glucose, Bld 133 (*)    BUN 87 (*)    Creatinine, Ser 11.15 (*)    Total Bilirubin 4.3 (*)    GFR calc non Af Amer 3 (*)    GFR calc Af Amer 4 (*)    Anion gap 24 (*)    All other components within normal limits  CBC - Abnormal; Notable for the following components:   Hemoglobin 11.7 (*)    All other components within normal limits  PROTEIN / CREATININE RATIO, URINE - Abnormal; Notable for the following components:   Protein Creatinine Ratio 0.70 (*)    All other components within normal limits  CBG MONITORING, ED - Abnormal; Notable for the following components:   Glucose-Capillary 218 (*)    All other components within normal limits  CBG MONITORING, ED - Abnormal; Notable for the following components:   Glucose-Capillary 66 (*)    All other components within normal limits  SARS CORONAVIRUS 2 BY RT PCR (HOSPITAL ORDER, PERFORMED IN [facility])  LIPASE, BLOOD  CK  RAPID HIV SCREEN (HIV 1/2 AB+AG)  PROTIME-INR  APTT  URINALYSIS, ROUTINE W REFLEX MICROSCOPIC  BASIC METABOLIC PANEL  PROTEIN / CREATININE RATIO, URINE  ANCA TITERS  C3 COMPLEMENT  C4 COMPLEMENT  GLOMERULAR BASEMENT MEMBRANE ANTIBODIES  ANTINUCLEAR ANTIBODIES, IFA  CK  CBG MONITORING, ED  TYPE AND SCREEN    EKG EKG Interpretation  Date/Time:  Thursday December 13 2018 00:38:04 EDT Ventricular Rate:  87 PR Interval:  146 QRS Duration: 82 QT Interval:  372 QTC Calculation: 447 R Axis:   8 Text Interpretation:  Normal sinus rhythm Low voltage  QRS Cannot  rule out Anterior infarct , age undetermined Abnormal ECG Confirmed by Ross Marcus ([id number]) on 12/13/2018 1:21:44 AM   Radiology Ct Abdomen Pelvis Wo Contrast  Result Date: 12/13/2018 CLINICAL DATA:  Abdominal distension with 6 weeks of intermittent nausea and vomiting EXAM: CT ABDOMEN AND PELVIS WITHOUT CONTRAST TECHNIQUE: Multidetector CT imaging of the abdomen and pelvis was performed following the standard protocol without IV contrast. COMPARISON:  None FINDINGS: Lower chest: Bandlike areas of opacity in both lung bases likely reflect subsegmental atelectasis and/or scarring. Hepatobiliary: No focal liver abnormality is seen. Gradient density within the gallbladder lumen likely reflects biliary sludge. No visible calcified gallstones or gallbladder wall thickening. Common bile duct is borderline dilated at 8 mm. Pancreas: Fatty replacement of the pancreas. No pancreatic ductal dilatation or surrounding inflammatory changes. Spleen: Normal in size without focal abnormality. Adrenals/Urinary Tract: Normal adrenal glands. There is atrophy and extensive vascular calcification of the left kidney with a several fluid attenuation cysts. Additional hypoattenuating foci in the left kidney are too small to fully characterize on CT imaging but statistically likely benign. The right kidney is markedly enlarged, some of which may be compensatory hypertrophy however there is severe right hydronephrosis to the level of the 11 mm calculus at the right ureteropelvic junction. Distal ureter is more normal caliber. Urinary bladder is largely decompressed at the time of exam and therefore poorly evaluated by CT imaging. Stomach/Bowel: Distal esophagus, stomach and duodenal sweep are unremarkable. No small bowel wall thickening or dilatation. No evidence of obstruction. There are postsurgical changes at the tip of the cecum with several surgical clips however a short tubular structure extends from the cecal tip  with a large 15 mm calcification at the base suggesting a dilated, hyperemic appendiceal stump. Minimal stranding is seen adjacent to this location. Remainder of the colon has a more normal appearance. No colonic dilatation or wall thickening. Vascular/Lymphatic: Atherosclerotic plaque is seen throughout the aorta and branch vessels. Mild fusiform ectasia of the infrarenal abdominal aorta to 2.6 cm. Reproductive: Uterus is surgically absent. No concerning adnexal lesions. Other: Mild circumferential body wall edema. No bowel containing hernia. Small volume low-attenuation free fluid tracking inferiorly to the deep pelvis, likely reactive. No free intraperitoneal air. Musculoskeletal: Postsurgical changes from prior L4-S1 posterior spinal fusion with transpedicular screws at the L4 and S1 levels and bony fusion across the posterior elements. Multilevel degenerative changes are present in the imaged portions of the spine. Adjacent segment disease noted at L3-4. Mild levocurvature of the lumbar spine. IMPRESSION: 1. Severe right hydronephrosis to the level of a 11 mm calculus at the right ureteropelvic junction. Extensive adjacent retroperitoneal stranding and inflammation. 2. Postsurgical changes at the tip of the cecum compatible with reported history of appendectomy however a short tubular structure extending from the cecal tip measuring 2.7 cm in diameter with a large 15 mm calcification at the base suggesting a dilated, hyperemic appendiceal stump. Minimal stranding is seen adjacent to this location. Findings are concerning for acute appendicitis. Recommend surgical consultation. Additionally given size of this dilation, a mucocele is not fully excluded. 3. Gradient density within the gallbladder lumen likely reflects biliary sludge. No visible calcified gallstones or gallbladder wall thickening. Borderline biliary ductal dilatation. If there is clinical concern for choledocholithiasis or acute cholecystitis,  consider further evaluation with right upper quadrant ultrasound. 4. Mild fusiform ectasia of the infrarenal abdominal aorta to 2.6 cm. Ectatic abdominal aorta at risk for aneurysm development. Recommend followup by ultrasound in 5 years. This recommendation follows  ACR consensus guidelines: White Paper of the ACR Incidental Findings Committee II on Vascular Findings. J Am Coll Radiol 2013; 10:789-794. Aortic aneurysm NOS (ICD10-I71.9) 5. Circumferential body wall edema, trace pericardial fluid, and small volume of fluid in the deep pelvis. Features suggesting mild anasarca/volume third-spacing. 6. Aortic Atherosclerosis (ICD10-I70.0). 7. Prior L4-S1 posterior spinal fusion without hardware failure or loosening. These results were called by telephone at the time of interpretation on 12/13/2018 at 2:18 am to provider [facility] , who verbally acknowledged these results. Electronically Signed   By: Kreg Shropshire M.D.   On: 12/13/2018 02:27   Dg Chest 2 View  Result Date: 12/13/2018 CLINICAL DATA:  Acute renal failure EXAM: CHEST - 2 VIEW COMPARISON:  None. FINDINGS: Tiny bilateral pleural effusions. No consolidation. Normal heart size. Aortic atherosclerosis. No pneumothorax. IMPRESSION: Tiny bilateral pleural effusions. Electronically Signed   By: Jasmine Pang M.D.   On: 12/13/2018 01:52   US Abdomen Limited Ruq  Result Date: 12/13/2018 CLINICAL DATA:  Dilated CBD seen on same day CT EXAM: ULTRASOUND ABDOMEN LIMITED RIGHT UPPER QUADRANT COMPARISON:  CT 12/13/2018 FINDINGS: Gallbladder: No sonographically evident gallstones or biliary sludge. No gallbladder wall thickening. Sonographic Eulah Pont sign is reportedly negative per sonographer. Prominent fold at the neck of the gallbladder. Common bile duct: Diameter: 8.3 mm, dilated Liver: No focal lesion identified. Within normal limits in parenchymal echogenicity. Portal vein is patent on color Doppler imaging with normal direction of blood flow towards  the liver. Other: None. IMPRESSION: Mild nonspecific extrahepatic biliary ductal dilatation without visible choledocholithiasis or biliary sludge. Recommend correlation with serologies and if there is persisting clinical concern for choledocholithiasis, MRCP could be obtained. Electronically Signed   By: Kreg Shropshire M.D.   On: 12/13/2018 03:26    Procedures .Critical Care Performed by: Cristina Gong, PA-C Authorized by: Cristina Gong, PA-C   Critical care provider statement:    Critical care time (minutes):  60   Critical care was necessary to treat or prevent imminent or life-threatening deterioration of the following conditions:  Endocrine crisis, metabolic crisis, dehydration and renal failure   Critical care was time spent personally by me on the following activities:  Discussions with consultants, evaluation of patient's response to treatment, examination of patient, ordering and performing treatments and interventions, ordering and review of laboratory studies, ordering and review of radiographic studies, pulse oximetry, re-evaluation of patient's condition, obtaining history from patient or surrogate and review of old charts   (including critical care time)  Medications Ordered in ED Medications  sodium chloride flush (NS) 0.9 % injection 3 mL (3 mLs Intravenous Not Given 12/13/18 0239)  ondansetron (ZOFRAN) 4 MG/2ML injection (  Canceled Entry 12/13/18 0243)  morphine 2 MG/ML injection 2 mg (has no administration in time range)  ondansetron (ZOFRAN) injection 4 mg (has no administration in time range)  labetalol (NORMODYNE) injection 5 mg (has no administration in time range)  0.9 %  sodium chloride infusion (has no administration in time range)  sodium zirconium cyclosilicate (LOKELMA) packet 10 g (has no administration in time range)  ondansetron (ZOFRAN-ODT) disintegrating tablet 4 mg (4 mg Oral Given 12/12/18 1920)  insulin aspart (novoLOG) injection 5 Units (5  Units Intravenous Given 12/13/18 0254)    And  dextrose 50 % solution 50 mL (50 mLs Intravenous Given 12/13/18 0254)  sodium chloride 0.9 % bolus 1,000 mL (1,000 mLs Intravenous New Bag/Given 12/13/18 0254)  ondansetron (ZOFRAN) injection 4 mg (4 mg Intravenous Given 12/13/18 0209)  Initial Impression / Assessment and Plan / ED Course  I have reviewed the triage vital signs and the nursing notes.  Pertinent labs & imaging results that were available during my care of the patient were reviewed by me and considered in my medical decision making (see chart for details).  Clinical Course as of Dec 12 624  Thu Dec 13, 2018  0157 I spoke with Dr. Augustin Coupe from nephrology who reports no additional orders needed, Nephrology will see her in the morning.    [EH]  0214 Spoke with radiologist who reports that patient has an 11 mm obstructing right renal stone and a atrophic appearing left kidney.  She also has biliary sludging and the CBD is borderline dilated at 8 mm.  In addition at the cecum there is what appears to be an distended appendiceal stump with a fecalith and mild stranding.   [EH]  0301 Spoke with Dr. Graylon Good of urology who will come see patient.   I spoke with Dr. Barry Dienes who will come see the patient.  Dr. Blaine Hamper will see patient for admission.    [EH]    Clinical Course User Index [EH] Lorin Glass, PA-C      Patient presents today for evaluation of nausea and vomiting worsening over the past 6 weeks.  She came in today as she generally did not feel well.  Here she appears to have a acute renal failure.  Creatinine within the past month was 1 and today is elevated at 11.15 with a GFR of 3.  In addition to that she has an anion gap of 24.  Her potassium is elevated at 6.2.  EKG does not show evidence of changes from hyperkalemia.  Lipase is not elevated.  She does not have a significant leukocytosis.  Mild anemia with a hemoglobin of 11.7.  CT scan of abdomen pelvis without  contrast was obtained showing multiple concerns. She has a apparent left renal atrophy and right renal obstructing stone which I suspect is the primary cause for her acute renal failure.  In addition to the she appears to possibly have appendicitis of her appendiceal stump and concern for common bile duct widening has the main concerns.  I spoke with Dr. Augustin Coupe from nephrology who will see the patient in the morning. I spoke with Dr. Graylon Good from urology who states they will see the patient for evaluation. I spoke with Dr. Barry Dienes from general surgery who will come see the patient for evaluation of her possible appendicitis and her dilated common bile duct.  I spoke with Dr. Blaine Hamper of the hospitalist team who will admit the patient.    This patient had multiple medical concerns requiring multiple phone calls with 3 specialists in addition to the hospitalist team.    This patient was seen as a shared visit with Dr. Dina Rich.   Patient remained hemodynamically stable while in my care.   Final Clinical Impressions(s) / ED Diagnoses   Final diagnoses:  Dilated cbd, acquired  Acute renal failure with other specified pathological lesion in kidney [facility])  Right ureteral stone  Left renal atrophy  Fecalith of appendix  Dehydration  Non-intractable vomiting with nausea, unspecified vomiting type    ED Discharge Orders    None       Lorin Glass, PA-C 12/13/18 0412    Lorin Glass, PA-C 12/13/18 2694    Merryl Hacker, MD 12/13/18 [phone]

## 2018-12-13 NOTE — Op Note (Signed)
Preoperative diagnosis: Obstructing right UPJ stone, acute renal insufficiency  Postoperative diagnosis: Same  Principal procedure: Cystoscopy, right retrograde ureteropyelogram, fluoroscopic interpretation, placement of 6 French by 24 cm contour double-J stent without tether  Surgeon: Rusty Glodowski  Anesthesia: General  Complications: None  Estimated blood loss: None  Specimens: None  Drains: 16 French Foley catheter to bedside bag, 6 [place] by 24 cm contour double-J stent and right ureter  Indications: 68 year old female with acute renal insufficiency, creatinine 11.  She has had abdominal bloating for the better part of 6 weeks.  She has had history of prior urolithiasis on the left side.  CT revealed a large obstructing right UPJ stone as well as an atrophic left kidney.  She presents at this time, saying that her creatinine is 11, for urgent stent placement.  I talked to the patient about the procedure, she is aware of stents that she has had one before.  She is aware of the urgent need for placement of this and desires to proceed.  Description of procedure: The patient was properly identified and marked in the holding area.  She received preoperative Cipro.  She was taken to the operating room where general anesthetic was administered and she was placed in the dorsolithotomy position.  Genitalia and perineum were prepped and draped.  Proper timeout was performed.  21 French panendoscope was placed under direct vision in the bladder which was inspected and found to have normal urothelium.  Ureteral orifice ease were somewhat inferiorly placed but were identified.  Limited right retrograde pyelogram was performed using Omnipaque and gentle pressure.  This revealed a normal right ureter throughout with a filling defect, quite large, at the right UPJ.  There was moderate pyelocaliectasis.  Following this, a guidewire was placed through the open-ended catheter with advancement of the guidewire  into the upper pole calyceal system.  The guidewire was kept in place with the open-ended catheter being removed.  The 6 [place] by 24 cm contour double-J stent was then placed over top the guidewire, properly positioned in the right ureter with excellent proximal and distal curl seen using fluoroscopy and cystoscopy once the guidewire was removed.  The scope was removed.  73 French Foley catheter was then placed, balloon filled with 10 cc of water.  This then terminated the procedure.  The patient was awakened and taken to the PACU in stable condition.  I did discuss the procedure with the patient's husband, Joe following completion.

## 2018-12-13 NOTE — Transfer of Care (Signed)
Immediate Anesthesia Transfer of Care Note  Patient: Mary [name]  Procedure(s) Performed: CYSTOSCOPY WITH URETERAL STENT PLACEMENT (Right )  Patient Location: PACU  Anesthesia Type:General  Level of Consciousness: awake, alert  and oriented  Airway & Oxygen Therapy: Patient Spontanous Breathing and Patient connected to nasal cannula oxygen  Post-op Assessment: Report given to RN, Post -op Vital signs reviewed and stable and Patient moving all extremities  Post vital signs: Reviewed and stable  Last Vitals:  Vitals Value Taken Time  BP 121/83 12/13/18 0842  Temp    Pulse 100 12/13/18 0844  Resp 25 12/13/18 0844  SpO2 100 % 12/13/18 0844  Vitals shown include unvalidated device data.  Last Pain:  Vitals:   12/13/18 0649  TempSrc:   PainSc: 5          Complications: No apparent anesthesia complications

## 2018-12-14 ENCOUNTER — Encounter (HOSPITAL_COMMUNITY): Payer: Self-pay | Admitting: Urology

## 2018-12-14 LAB — CBC WITH DIFFERENTIAL/PLATELET
Abs Immature Granulocytes: 0.09 10*3/uL — ABNORMAL HIGH (ref 0.00–0.07)
Basophils Absolute: 0 10*3/uL (ref 0.0–0.1)
Basophils Relative: 0 %
Eosinophils Absolute: 0 10*3/uL (ref 0.0–0.5)
Eosinophils Relative: 0 %
HCT: 30.8 % — ABNORMAL LOW (ref 36.0–46.0)
Hemoglobin: 9.8 g/dL — ABNORMAL LOW (ref 12.0–15.0)
Immature Granulocytes: 1 %
Lymphocytes Relative: 13 %
Lymphs Abs: 1.2 10*3/uL (ref 0.7–4.0)
MCH: 28.9 pg (ref 26.0–34.0)
MCHC: 31.8 g/dL (ref 30.0–36.0)
MCV: 90.9 fL (ref 80.0–100.0)
Monocytes Absolute: 0.7 10*3/uL (ref 0.1–1.0)
Monocytes Relative: 8 %
Neutro Abs: 6.6 10*3/uL (ref 1.7–7.7)
Neutrophils Relative %: 78 %
Platelets: 378 10*3/uL (ref 150–400)
RBC: 3.39 MIL/uL — ABNORMAL LOW (ref 3.87–5.11)
RDW: 14.7 % (ref 11.5–15.5)
WBC: 8.6 10*3/uL (ref 4.0–10.5)
nRBC: 0 % (ref 0.0–0.2)

## 2018-12-14 LAB — COMPREHENSIVE METABOLIC PANEL
ALT: 13 U/L (ref 0–44)
AST: 27 U/L (ref 15–41)
Albumin: 2.9 g/dL — ABNORMAL LOW (ref 3.5–5.0)
Alkaline Phosphatase: 53 U/L (ref 38–126)
Anion gap: 13 (ref 5–15)
BUN: 60 mg/dL — ABNORMAL HIGH (ref 8–23)
CO2: 20 mmol/L — ABNORMAL LOW (ref 22–32)
Calcium: 8.6 mg/dL — ABNORMAL LOW (ref 8.9–10.3)
Chloride: 108 mmol/L (ref 98–111)
Creatinine, Ser: 5.94 mg/dL — ABNORMAL HIGH (ref 0.44–1.00)
GFR calc Af Amer: 8 mL/min — ABNORMAL LOW (ref >60)
GFR calc non Af Amer: 7 mL/min — ABNORMAL LOW (ref >60)
Glucose, Bld: 83 mg/dL (ref 70–99)
Potassium: 5.1 mmol/L (ref 3.5–5.1)
Sodium: 141 mmol/L (ref 135–145)
Total Bilirubin: 1.4 mg/dL — ABNORMAL HIGH (ref 0.3–1.2)
Total Protein: 6 g/dL — ABNORMAL LOW (ref 6.5–8.1)

## 2018-12-14 LAB — GLOMERULAR BASEMENT MEMBRANE ANTIBODIES: GBM Ab: 4 units (ref 0–20)

## 2018-12-14 LAB — ANCA TITERS
Atypical P-ANCA titer: <1:20 {titer}
C-ANCA: <1:20 {titer}
P-ANCA: <1:20 {titer}

## 2018-12-14 MED ORDER — CHLORHEXIDINE GLUCONATE CLOTH 2 % EX PADS
6.0000 | MEDICATED_PAD | Freq: Every day | CUTANEOUS | Status: DC
  Administered 2018-12-14 – 2018-12-15 (×2): 6 via TOPICAL

## 2018-12-14 NOTE — Progress Notes (Signed)
PROGRESS NOTE  Mary [name] WGN:[id number] DOB: 10-09-1950 DOA: 12/12/2018 PCP: Daisy Florooss, Charles Alan, MD  Brief History   Mary HeadDolan Munce is a 68 y.o. female with medical history significant of hypertension, hyperlipidemia, asthma, GERD, anxiety, Sjogren's syndrome, ataxic, chronic pain syndrome, CKD stage III, kidney stone (left kidney stone surgery 2008), who presents with nausea, vomiting, diarrhea, abdominal pain, ataxia.  Pt state that she has been having intermittent nausea, vomiting, diarrhea for more than 6 weeks.  She she has had nonbilious nonbloody vomitus 6 times today.  She has had 4-5 times of watery and loose stool bowel movement today.  Patient states that she has intermittent central abdominal pain. During the same period of time, patient has been having ataxia.  Described as unsteady gait particularly on turning around.  Patient denies unilateral weakness, numbness or tingling in the extremities.  No facial droop or slurred speech. She had seen ENT and neurology for ataxia with negative work-up thus far. They plan to do MRI of brain, but it is not done yet. She state that she had fever 101.8 three days ago.  Currently no fever or chills.  Patient denies chest pain or cough.  She states that sometimes she has some mild shortness of breath.  She denies symptoms of UTI, no hematuria.    ED Course: pt was found to have WBC 8.6, lipase 29, pending COVID-19 test, pending CK, potassium 6.2, bicarbonate of 14, creatinine 11.15, BUN 87 (creatinine 1.13 on 11/28/2018), liver function (ALP 74, AST 20, ALT 13, total bilirubin 4.3), temperature normal, blood pressure 165/74, heart rate 86, oxygen saturation 98% on room air.  Chest x-ray showed tiny bilateral pleural effusion. CT- abdomen/pelvis showed left atrophic kidney, right hydronephrosis, right obstructing ureteral pelvic junction stone, inflammation of appendix stump, and biliary sludge and CBD dilation.  Patient is admitted to telemetry  bed as inpatient.  ED physician consulted nephrology, Dr. Juel BurrowLin, general surgeon, Dr. Donell BeersByerly nd urologist, Dr. Ilsa IhaSnyder.  The patient was taken to the OR by Dr. Lynnae Sandhoffahlsted on 12/12/2018 and performed a cystoscopy with ureterostomy and stent placement.   I have consulted [facility] due to the CT findings of CBD dilatation, elevated bilirubin, and abnormal appearance of appendiceal stump on CT. The patient was seen by Dr. Marca AnconaKarki and will follow up with her as outpatient with colonoscopy.  Consultants  Urology Nephrology  Procedures   Cystoscopy with stent placement at UPJ  Antibiotics   Anti-infectives (From admission, onward)   Start     Dose/Rate Route Frequency Ordered Stop   12/13/18 1800  ceFEPIme (MAXIPIME) 1 g in sodium chloride 0.9 % 100 mL IVPB     1 g 200 mL/hr over 30 Minutes Intravenous Every 24 hours 12/13/18 1739     12/13/18 0815  ciprofloxacin (CIPRO) IVPB 400 mg     400 mg 200 mL/hr over 60 Minutes Intravenous To Surgery 12/13/18 0811 12/13/18 0816     Subjective  The patient is awake and alert, and oriented x 3. She states that she feels better.   Objective   Vitals:  Vitals:   12/14/18 0600 12/14/18 1311  BP: (!) 146/62 130/65  Pulse: 69 66  Resp: 18 20  Temp: 97.8 F (36.6 C) 99.7 F (37.6 C)  SpO2: 99% 94%    Exam:  Constitutional:   The patient is awake, alert, and oriented x 3. No acute distress. Respiratory:   No increased work of breathing.  No wheezes, rales, or rhonchi  No tactile fremitus Cardiovascular:  Regular rate and rhythm  No murmurs, ectopy, or gallups.  No lateral PMI. No thrills. Abdomen:   Abdomen is soft, non-tender, non-distended  No hernias, masses, or organomegaly  Normoactive bowel sounds.  Musculoskeletal:   No cyanosis, clubbing, or edema Skin:   No rashes, lesions, ulcers  palpation of skin: no induration or nodules Neurologic:   CN 2-12 intact  Sensation all 4 extremities  intact Psychiatric:   Mental status o Mood, affect appropriate o Patient is confused.  I have personally reviewed the following:   Today's Data   Vitals, BMP, CBC  Micro Data   Urine cultures and sensitivities.  Scheduled Meds:  Chlorhexidine Gluconate Cloth  6 each Topical Daily   cholecalciferol  1,000 Units Oral Daily   fluticasone  1 spray Each Nare BID   hydrALAZINE  25 mg Oral Q8H   multivitamin with minerals  1 tablet Oral Daily   sodium bicarbonate  50 mEq Intravenous Once   sodium chloride flush  3 mL Intravenous Once   sodium zirconium cyclosilicate  10 g Oral Once   Continuous Infusions:  sodium chloride 75 mL/hr at 12/14/18 0623   ceFEPime (MAXIPIME) IV 1 g (12/14/18 1701)    Principal Problem:   Acute renal failure superimposed on stage 3 chronic kidney disease (HCC) Active Problems:   Anxiety   Essential hypertension   Ataxia   Elevated bilirubin   Hyperkalemia   Nausea vomiting and diarrhea   Choledocholithiasis   Hydronephrosis with ureteropelvic junction obstruction (CODE)   Abdominal pain   Chronic pain syndrome   LOS: 1 day   A & P  Acute renal failure superimposed on stage 3 chronic kidney disease ([place]): Baseline creatinine 1.013 on 11/28/2018.  Her creatinine was 5.94 this morning , down from 11.5 on admission. Most likely due to  hydronephrosis with ureteropelvic junction obstruction. Nephrogy Dr. Augustin Coupe was consulted. Continue IV Fluid. Continue to hold lasix and lisinopril.  Pyelonephritis: E. Coli and E. Faecalis grown from urine culture. Cefepime initiated.  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 Dorina Hoyer and a stent was placed in her right UPJ to clear obstruction.   Abdominal pain: Due to multifactorial etiology. CT- abdomen/pelvis showed right hydronephrosis, right obstructing ureteral pelvic junction stone, inflammation of appendix stump, and biliary sludge and CBD dilation. Pt  also has nausea, vomiting and diarrhea, will need to rule out infection. general surgeon, Dr. Barry Dienes and urologist, Dr. Graylon Good were consulted. Pain control and monitor abdomen. Gi has been consulted. They feel no work up is necessary for mild CBD dilatation and gallbladder sludge described on CT. They will follow the patient as outpatient for colonoscopy for appendiceal stump abnormality.  Anxiety: prn hydroxyzine  Essential hypertension: As needed IV labetalol available. Oral hydralazine has been initiated. Continue to hold Lasix and lisinopril due to poor renal function  Elevated bilirubin: AST and ALT normal. TB 4.3. CT showed  biliary sludge and CBD dilation. Monitor and consult GI for possible ERCP when patient has recovered from pyelonephritis. T bili down to 1.4 today. No further work up necessary per Dr. Therisa Doyne.  Hyperkalemia: Resolved. Monitor. Potassium 5.1 this morning.  Ataxia: Etiology is not clear.  Patient is following up with outpatient neurologist.  Planning to do MRI of brain. Patient will needs MRI of the brain, should be done after surgery (Not ordered yet)  Nausea vomiting and diarrhea: Etiology is not clear.  This issue has been going on for more than  6 weeks. Patient denies current diarrhea. She is eating dinner without difficulty. Monitor.  I have seen and examined this patient myself. I have spent 30 minutes in her evaluation and care.  Nidia Grogan, DO [facility] Direct contact: see [email or web address]  7PM-7AM contact night coverage as above 12/14/2018, 5:58 PM  LOS: 0 days

## 2018-12-14 NOTE — Consult Note (Signed)
[facility] Consult  Referring Provider: Epimenio Foot Hospitalist Primary Care Physician:  Lawerance Cruel, MD Primary Gastroenterologist: Sadie Haber primary  Reason for Consultation: Abnormal CAT scan none mildly dilated CBD  HPI: Mary [name] is a 68 y.o. female who was admitted on 12/13/2018 with complaints of nausea, vomiting, dizziness for the last several weeks.  She has history of Sjogren's syndrome and was being evaluated for inner ear disease after referral to ENT and was recommended to get an MRI of the head.  As nausea and vomiting persisted she came to the ED, after she experienced fever of 101.8 F and was found to have a creatinine of 11.15 with a CAT scan showing atrophic left kidney, right hydronephrosis, right obstructing ureteropelvic junction stone.  She underwent a cystoscopy and placement of a double-J stent in the right ureter. She was also found to have a borderline dilated CBD of 8 mm with no gallstones or gallbladder wall thickening, possible biliary sludge, fatty replacement of pancreas.  Patient reports having an open appendectomy almost 20 years ago and was noted to have a short tubular structure extending from cecal tip with a large 15 mm calcification at the base suggesting a dilated hyperemic appendiceal stump with minimal stranding concerning for appendicitis/mucocele.  She was seen by Dr. Barry Dienes who suggested patient may have a mucocele of the appendix and recommended a nonemergent colonoscopy.  Patient has never had a colonoscopy as an outpatient. She takes MS Contin and Percocet on a regular basis and has frequent constipation requiring use of MiraLAX. She denies blood in the stool or black stools. Occasionally she experiences difficulty swallowing pills, but denies difficulty swallowing solids or liquids, denies weight loss. Currently denies abdominal pain and has improvement in nausea.    Past Medical History:  Diagnosis Date  . Allergy    . Arthritis   . Asthma   . Chronic kidney disease   . Diabetes mellitus   . GERD (gastroesophageal reflux disease)   . Headache(784.0)   . High cholesterol   . Hypertension   . Migraines   . Sjogrens syndrome ([place])   . Thyroid disease     Past Surgical History:  Procedure Laterality Date  . ABDOMINAL HYSTERECTOMY  1998  . ANTERIOR CERVICAL DECOMP/DISCECTOMY FUSION  2011  . APPENDECTOMY    . BREAST BIOPSY  1971  . CYSTOSCOPY W/ URETERAL STENT PLACEMENT Right 12/13/2018   Procedure: CYSTOSCOPY WITH URETERAL STENT PLACEMENT;  Surgeon: Franchot Gallo, MD;  Location: [facility];  Service: Urology;  Laterality: Right;  . KIDNEY STONE SURGERY  2011  . LUMBAR FUSION  2005  . TONSILLECTOMY  1971    Prior to Admission medications   Medication Sig Start Date End Date Taking? Authorizing Provider  butalbital-acetaminophen-caffeine (FIORICET, ESGIC) 50-325-40 MG tablet Take 1 tablet by mouth every 6 (six) hours as needed for headache. 01/19/15  Yes Janith Lima, MD  cholecalciferol (VITAMIN D3) 25 MCG (1000 UT) tablet Take 1,000 Units by mouth daily.   Yes [provider]  fluticasone (FLONASE) 50 MCG/ACT nasal spray Place 1 spray into both nostrils 2 (two) times daily.   Yes [provider]  furosemide (LASIX) 20 MG tablet TAKE 1 BY MOUTH TWICE DAILY Patient taking differently: Take 20 mg by mouth 2 (two) times daily.  01/19/15  Yes Janith Lima, MD  hydrOXYzine (ATARAX/VISTARIL) 25 MG tablet Take 25 mg by mouth every 8 (eight) hours as needed for anxiety.    Yes [provider]  lisinopril (ZESTRIL) 40 MG tablet Take 40 mg by mouth daily.  10/07/18  Yes [provider]  Melatonin 3 MG TABS Take 3 mg by mouth at bedtime as needed (for sleep).    Yes [provider]  methocarbamol (ROBAXIN) 500 MG tablet Take 1,000 mg by mouth 4 (four) times daily.    Yes [provider]  morphine (MS CONTIN) 30 MG 12 hr tablet Take 30 mg by mouth  every 8 (eight) hours.  08/25/11  Yes [provider]  Multiple Vitamin (MULTIVITAMIN) tablet Take 1 tablet by mouth daily.   Yes [provider]  oxyCODONE-acetaminophen (PERCOCET) 7.5-325 MG per tablet Take 1 tablet by mouth 3 (three) times daily as needed for severe pain.    Yes [provider]    Current Facility-Administered Medications  Medication Dose Route Frequency Provider Last Rate Last Dose  . 0.9 %  sodium chloride infusion   Intravenous Continuous Franchot Gallo, MD 75 mL/hr at 12/14/18 [phone]    . acetaminophen (TYLENOL) tablet 650 mg  650 mg Oral Q6H PRN Ivor Costa, MD       Or  . acetaminophen (TYLENOL) suppository 650 mg  650 mg Rectal Q6H PRN Ivor Costa, MD      . calcium carbonate (TUMS - dosed in mg elemental calcium) chewable tablet 200 mg of elemental calcium  1 tablet Oral BID PRN Swayze, Ava, DO   200 mg of elemental calcium at 12/13/18 2247  . ceFEPIme (MAXIPIME) 1 g in sodium chloride 0.9 % 100 mL IVPB  1 g Intravenous Q24H Swayze, Ava, DO   Stopped at 12/13/18 1918  . Chlorhexidine Gluconate Cloth 2 % PADS 6 each  6 each Topical Daily Swayze, Ava, DO      . cholecalciferol (VITAMIN D3) tablet 1,000 Units  1,000 Units Oral Daily Ivor Costa, MD   1,000 Units at 12/14/18 [phone]  . fluticasone (FLONASE) 50 MCG/ACT nasal spray 1 spray  1 spray Each Nare BID Ivor Costa, MD   1 spray at 12/14/18 0930  . hydrALAZINE (APRESOLINE) tablet 25 mg  25 mg Oral Q8H Ivor Costa, MD   25 mg at 12/14/18 0617  . hydroxypropyl methylcellulose / hypromellose (ISOPTO TEARS / GONIOVISC) 2.5 % ophthalmic solution 1 drop  1 drop Both Eyes BID BM & HS PRN Swayze, Ava, DO      . hydrOXYzine (ATARAX/VISTARIL) tablet 25 mg  25 mg Oral Q8H PRN Ivor Costa, MD   25 mg at 12/13/18 2027  . labetalol (NORMODYNE) injection 5 mg  5 mg Intravenous Q2H PRN Franchot Gallo, MD      . Melatonin TABS 3 mg  1 tablet Oral QHS PRN Ivor Costa, MD   3 mg at 12/13/18 2259  . methocarbamol  (ROBAXIN) tablet 1,000 mg  1,000 mg Oral Q8H PRN Ivor Costa, MD   1,000 mg at 12/14/18 5462  . morphine 2 MG/ML injection 2 mg  2 mg Intravenous Q4H PRN Franchot Gallo, MD   2 mg at 12/13/18 1410  . multivitamin with minerals tablet 1 tablet  1 tablet Oral Daily Ivor Costa, MD   1 tablet at 12/14/18 7035  . ondansetron (ZOFRAN) injection 4 mg  4 mg Intravenous Q8H PRN Franchot Gallo, MD      . oxyCODONE-acetaminophen (PERCOCET/ROXICET) 5-325 MG per tablet 1 tablet  1 tablet Oral TID PRN Skeet Simmer, [facility]   1 tablet at 12/14/18 0093   And  . oxyCODONE (Oxy IR/ROXICODONE) immediate release tablet  2.5 mg  2.5 mg Oral TID PRN Skeet Simmer, RPH   2.5 mg at 12/13/18 2300  . sodium bicarbonate injection 50 mEq  50 mEq Intravenous Once Ivor Costa, MD      . sodium chloride flush (NS) 0.9 % injection 3 mL  3 mL Intravenous Once Ivor Costa, MD      . sodium zirconium cyclosilicate (LOKELMA) packet 10 g  10 g Oral Once Franchot Gallo, MD        Allergies as of 12/12/2018 - Review Complete 12/12/2018  Allergen Reaction Noted  . Sulfa antibiotics Nausea And Vomiting 01/14/2015  . Adhesive [tape]  09/21/2011  . Celebrex [celecoxib]  06/30/2011  . Lyrica [pregabalin]  06/30/2011  . Macrobid [nitrofurantoin macrocrystal]  06/30/2011  . Penicillins  06/30/2011  . Tribenzor [olmesartan-amlodipine-hctz]  08/11/2011  . Tricor [fenofibrate]  06/30/2011  . Vioxx [rofecoxib]  06/30/2011  . Cephalexin  01/14/2015    Family History  Problem Relation Age of Onset  . Cancer Neg Hx   . Early death Neg Hx   . Heart disease Neg Hx   . Hyperlipidemia Neg Hx   . Hypertension Neg Hx   . Stroke Neg Hx     Social History   Socioeconomic History  . Marital status: Married    Spouse name: Not on file  . Number of children: Not on file  . Years of education: Not on file  . Highest education level: Not on file  Occupational History  . Not on file  Social Needs  . Financial resource strain:  Not on file  . Food insecurity    Worry: Not on file    Inability: Not on file  . Transportation needs    Medical: Not on file    Non-medical: Not on file  Tobacco Use  . Smoking status: Former Research scientist (life sciences)  . Smokeless tobacco: Never Used  Substance and Sexual Activity  . Alcohol use: No  . Drug use: No  . Sexual activity: Not Currently    Birth control/protection: Surgical  Lifestyle  . Physical activity    Days per week: Not on file    Minutes per session: Not on file  . Stress: Not on file  Relationships  . Social Herbalist on phone: Not on file    Gets together: Not on file    Attends religious service: Not on file    Active member of club or organization: Not on file    Attends meetings of clubs or organizations: Not on file    Relationship status: Not on file  . Intimate partner violence    Fear of current or ex partner: Not on file    Emotionally abused: Not on file    Physically abused: Not on file    Forced sexual activity: Not on file  Other Topics Concern  . Not on file  Social History Narrative  . Not on file    Review of Systems: Positive for: GI: Described in detail in HPI.    Gen: fever, anorexia, fatigue, weakness, malaise,Denies any chills, rigors, night sweats,  involuntary weight loss, and sleep disorder CV: Denies chest pain, angina, palpitations, syncope, orthopnea, PND, peripheral edema, and claudication. Resp: Denies dyspnea, cough, sputum, wheezing, coughing up blood. GU : Denies urinary burning, blood in urine, urinary frequency, urinary hesitancy, nocturnal urination, and urinary incontinence. MS: Denies joint pain or swelling.  Denies muscle weakness, cramps, atrophy.  Derm: Denies rash, itching, oral ulcerations, hives, unhealing  ulcers.  Psych: Denies depression, anxiety, memory loss, suicidal ideation, hallucinations,  and confusion. Heme: Denies bruising, bleeding, and enlarged lymph nodes. Neuro:  headaches, dizziness, Denies any  paresthesias. Endo:  DM,Denies any problems with  thyroid, adrenal function.  Physical Exam: Vital signs in last 24 hours: Temp:  [97.5 F (36.4 C)-99.7 F (37.6 C)] 99.7 F (37.6 C) (10/16 1311) Pulse Rate:  [65-78] 66 (10/16 1311) Resp:  [18-20] 20 (10/16 1311) BP: (130-151)/(57-83) 130/65 (10/16 1311) SpO2:  [91 %-100 %] 94 % (10/16 1311) Last BM Date: 12/14/18  General:   Alert,  Well-developed, well-nourished, pleasant and cooperative in NAD Head:  Normocephalic and atraumatic. Eyes:  Sclera clear, no icterus.  Mild pallor Ears:  Normal auditory acuity. Nose:  No deformity, discharge,  or lesions. Mouth:  No deformity or lesions.  Oropharynx pink & moist. Neck:  Supple; no masses or thyromegaly. Lungs:  Clear throughout to auscultation.   No wheezes, crackles, or rhonchi. No acute distress. Heart:  Regular rate and rhythm; no murmurs, clicks, rubs,  or gallops. Extremities:  Without clubbing or edema. Neurologic:  Alert and  oriented x4;  grossly normal neurologically. Skin:  Intact without significant lesions or rashes. Psych:  Alert and cooperative. Normal mood and affect. Abdomen:  Soft, nontender and nondistended. No masses, hepatosplenomegaly or hernias noted. Normal bowel sounds, without guarding, and without rebound.         Lab Results: Recent Labs    12/12/18 1942 12/13/18 1339 12/14/18 0637  WBC 8.6 7.8 8.6  HGB 11.7* 10.5* 9.8*  HCT 36.5 32.9* 30.8*  PLT 311 336 378   BMET Recent Labs    12/13/18 0422 12/13/18 1339 12/14/18 0637  NA 135 139 141  K 5.1 5.9* 5.1  CL 97* 103 108  CO2 13* 14* 20*  GLUCOSE 95 136* 83  BUN 90* 79* 60*  CREATININE 11.50* 9.54* 5.94*  CALCIUM 8.5* 9.0 8.6*   LFT Recent Labs    12/14/18 0637  PROT 6.0*  ALBUMIN 2.9*  AST 27  ALT 13  ALKPHOS 53  BILITOT 1.4*   PT/INR Recent Labs    12/13/18 0318  LABPROT 14.7  INR 1.2    Studies/Results: Ct Abdomen Pelvis Wo Contrast  Result Date:  12/13/2018 CLINICAL DATA:  Abdominal distension with 6 weeks of intermittent nausea and vomiting EXAM: CT ABDOMEN AND PELVIS WITHOUT CONTRAST TECHNIQUE: Multidetector CT imaging of the abdomen and pelvis was performed following the standard protocol without IV contrast. COMPARISON:  None FINDINGS: Lower chest: Bandlike areas of opacity in both lung bases likely reflect subsegmental atelectasis and/or scarring. Hepatobiliary: No focal liver abnormality is seen. Gradient density within the gallbladder lumen likely reflects biliary sludge. No visible calcified gallstones or gallbladder wall thickening. Common bile duct is borderline dilated at 8 mm. Pancreas: Fatty replacement of the pancreas. No pancreatic ductal dilatation or surrounding inflammatory changes. Spleen: Normal in size without focal abnormality. Adrenals/Urinary Tract: Normal adrenal glands. There is atrophy and extensive vascular calcification of the left kidney with a several fluid attenuation cysts. Additional hypoattenuating foci in the left kidney are too small to fully characterize on CT imaging but statistically likely benign. The right kidney is markedly enlarged, some of which may be compensatory hypertrophy however there is severe right hydronephrosis to the level of the 11 mm calculus at the right ureteropelvic junction. Distal ureter is more normal caliber. Urinary bladder is largely decompressed at the time of exam and therefore poorly evaluated by CT imaging. Stomach/Bowel:  Distal esophagus, stomach and duodenal sweep are unremarkable. No small bowel wall thickening or dilatation. No evidence of obstruction. There are postsurgical changes at the tip of the cecum with several surgical clips however a short tubular structure extends from the cecal tip with a large 15 mm calcification at the base suggesting a dilated, hyperemic appendiceal stump. Minimal stranding is seen adjacent to this location. Remainder of the colon has a more normal  appearance. No colonic dilatation or wall thickening. Vascular/Lymphatic: Atherosclerotic plaque is seen throughout the aorta and branch vessels. Mild fusiform ectasia of the infrarenal abdominal aorta to 2.6 cm. Reproductive: Uterus is surgically absent. No concerning adnexal lesions. Other: Mild circumferential body wall edema. No bowel containing hernia. Small volume low-attenuation free fluid tracking inferiorly to the deep pelvis, likely reactive. No free intraperitoneal air. Musculoskeletal: Postsurgical changes from prior L4-S1 posterior spinal fusion with transpedicular screws at the L4 and S1 levels and bony fusion across the posterior elements. Multilevel degenerative changes are present in the imaged portions of the spine. Adjacent segment disease noted at L3-4. Mild levocurvature of the lumbar spine. IMPRESSION: 1. Severe right hydronephrosis to the level of a 11 mm calculus at the right ureteropelvic junction. Extensive adjacent retroperitoneal stranding and inflammation. 2. Postsurgical changes at the tip of the cecum compatible with reported history of appendectomy however a short tubular structure extending from the cecal tip measuring 2.7 cm in diameter with a large 15 mm calcification at the base suggesting a dilated, hyperemic appendiceal stump. Minimal stranding is seen adjacent to this location. Findings are concerning for acute appendicitis. Recommend surgical consultation. Additionally given size of this dilation, a mucocele is not fully excluded. 3. Gradient density within the gallbladder lumen likely reflects biliary sludge. No visible calcified gallstones or gallbladder wall thickening. Borderline biliary ductal dilatation. If there is clinical concern for choledocholithiasis or acute cholecystitis, consider further evaluation with right upper quadrant ultrasound. 4. Mild fusiform ectasia of the infrarenal abdominal aorta to 2.6 cm. Ectatic abdominal aorta at risk for aneurysm development.  Recommend followup by ultrasound in 5 years. This recommendation follows ACR consensus guidelines: White Paper of the ACR Incidental Findings Committee II on Vascular Findings. J Am Coll Radiol 2013; 10:789-794. Aortic aneurysm NOS (ICD10-I71.9) 5. Circumferential body wall edema, trace pericardial fluid, and small volume of fluid in the deep pelvis. Features suggesting mild anasarca/volume third-spacing. 6. Aortic Atherosclerosis (ICD10-I70.0). 7. Prior L4-S1 posterior spinal fusion without hardware failure or loosening. These results were called by telephone at the time of interpretation on 12/13/2018 at 2:18 am to provider [facility] , who verbally acknowledged these results. Electronically Signed   By: Lovena Le M.D.   On: 12/13/2018 02:27   Dg Chest 2 View  Result Date: 12/13/2018 CLINICAL DATA:  Acute renal failure EXAM: CHEST - 2 VIEW COMPARISON:  None. FINDINGS: Tiny bilateral pleural effusions. No consolidation. Normal heart size. Aortic atherosclerosis. No pneumothorax. IMPRESSION: Tiny bilateral pleural effusions. Electronically Signed   By: Donavan Foil M.D.   On: 12/13/2018 01:52   Dg C-arm 1-60 Min-no Report  Result Date: 12/13/2018 Fluoroscopy was utilized by the requesting physician.  No radiographic interpretation.   US Abdomen Limited Ruq  Result Date: 12/13/2018 CLINICAL DATA:  Dilated CBD seen on same day CT EXAM: ULTRASOUND ABDOMEN LIMITED RIGHT UPPER QUADRANT COMPARISON:  CT 12/13/2018 FINDINGS: Gallbladder: No sonographically evident gallstones or biliary sludge. No gallbladder wall thickening. Sonographic Percell Miller sign is reportedly negative per sonographer. Prominent fold at the neck of the gallbladder.  Common bile duct: Diameter: 8.3 mm, dilated Liver: No focal lesion identified. Within normal limits in parenchymal echogenicity. Portal vein is patent on color Doppler imaging with normal direction of blood flow towards the liver. Other: None. IMPRESSION: Mild  nonspecific extrahepatic biliary ductal dilatation without visible choledocholithiasis or biliary sludge. Recommend correlation with serologies and if there is persisting clinical concern for choledocholithiasis, MRCP could be obtained. Electronically Signed   By: Lovena Le M.D.   On: 12/13/2018 03:26    Impression: Abnormal appearance of appendiceal stump, recommend a colonoscopy. Patient wants this to be done as an outpatient. She reports she has never had a colonoscopy because of fear of drinking colonic prep.  Patient noted to have slightly elevated bilirubin of 3, with normal AST/ALT/alk phos, total bilirubin has normalized to 1.4?  Gilbert's syndrome. Nonspecific extrahepatic biliary dilatation without choledocholithiasis or biliary sludge noted on ultrasound. Biliary dilatation may be related to narcotic use, and does not have features of biliary colic.  Normocytic anemia Renal impairment, improving   Plan: We will arrange for outpatient colonoscopy with me in the next few weeks after discharge. No plans for MRCP as liver enzymes are within normal limits, and minimal dilatation of CBD likely related to chronic narcotic use. GI will sign off, will follow the patient as an outpatient.    LOS: 1 day   Ronnette Juniper, MD  12/14/2018, 1:16 PM

## 2018-12-14 NOTE — Progress Notes (Signed)
Patient ID: Mary [name], female   DOB: 1950/08/22, 68 y.o.   MRN: [id number] S: Feels better today O:BP 130/65 (BP Location: Right Arm)   Pulse 66   Temp 99.7 F (37.6 C) (Oral)   Resp 20   SpO2 94%   Intake/Output Summary (Last 24 hours) at 12/14/2018 1606 Last data filed at 12/14/2018 1308 Gross per 24 hour  Intake 1772.57 ml  Output 2195 ml  Net -422.43 ml   Intake/Output: I/O last 3 completed shifts: In: 2562.6 [P.O.:620; I.V.:1592.5; Other:250; IV Piggyback:100.1] Out: 4350 [Urine:4350]  Intake/Output this shift:  Total I/O In: 360 [P.O.:360] Out: 745 [Urine:745] Weight change:  Gen: NAD but appears weak and chronically ill CVS: no rub Resp: cta Abd: +BS, soft, + tenderness to palpation without guarding or rebound Ext: no edema  Recent Labs  Lab 12/12/18 1942 12/13/18 0422 12/13/18 1339 12/14/18 0637  NA 134* 135 139 141  K 6.2* 5.1 5.9* 5.1  CL 96* 97* 103 108  CO2 14* 13* 14* 20*  GLUCOSE 133* 95 136* 83  BUN 87* 90* 79* 60*  CREATININE 11.15* 11.50* 9.54* 5.94*  ALBUMIN 3.5  --  3.0* 2.9*  CALCIUM 8.9 8.5* 9.0 8.6*  AST 20  --  29 27  ALT 13  --  12 13   Liver Function Tests: Recent Labs  Lab 12/12/18 1942 12/13/18 1339 12/14/18 0637  AST 20 29 27   ALT 13 12 13   ALKPHOS 74 70 53  BILITOT 4.3* 3.0* 1.4*  PROT 7.7 6.6 6.0*  ALBUMIN 3.5 3.0* 2.9*   Recent Labs  Lab 12/12/18 1942  LIPASE 26   No results for input(s): AMMONIA in the last 168 hours. CBC: Recent Labs  Lab 12/12/18 1942 12/13/18 1339 12/14/18 0637  WBC 8.6 7.8 8.6  NEUTROABS  --   --  6.6  HGB 11.7* 10.5* 9.8*  HCT 36.5 32.9* 30.8*  MCV 90.1 90.6 90.9  PLT 311 336 378   Cardiac Enzymes: Recent Labs  Lab 12/13/18 0422 12/13/18 0529  CKTOTAL 198 259*   CBG: Recent Labs  Lab 12/13/18 0302 12/13/18 0413 12/13/18 0456 12/13/18 0924  GLUCAP 218* 84 66* 104*    Iron Studies: No results for input(s): IRON, TIBC, TRANSFERRIN, FERRITIN in the last 72  hours. Studies/Results: Ct Abdomen Pelvis Wo Contrast  Result Date: 12/13/2018 CLINICAL DATA:  Abdominal distension with 6 weeks of intermittent nausea and vomiting EXAM: CT ABDOMEN AND PELVIS WITHOUT CONTRAST TECHNIQUE: Multidetector CT imaging of the abdomen and pelvis was performed following the standard protocol without IV contrast. COMPARISON:  None FINDINGS: Lower chest: Bandlike areas of opacity in both lung bases likely reflect subsegmental atelectasis and/or scarring. Hepatobiliary: No focal liver abnormality is seen. Gradient density within the gallbladder lumen likely reflects biliary sludge. No visible calcified gallstones or gallbladder wall thickening. Common bile duct is borderline dilated at 8 mm. Pancreas: Fatty replacement of the pancreas. No pancreatic ductal dilatation or surrounding inflammatory changes. Spleen: Normal in size without focal abnormality. Adrenals/Urinary Tract: Normal adrenal glands. There is atrophy and extensive vascular calcification of the left kidney with a several fluid attenuation cysts. Additional hypoattenuating foci in the left kidney are too small to fully characterize on CT imaging but statistically likely benign. The right kidney is markedly enlarged, some of which may be compensatory hypertrophy however there is severe right hydronephrosis to the level of the 11 mm calculus at the right ureteropelvic junction. Distal ureter is more normal caliber. Urinary bladder is largely decompressed  at the time of exam and therefore poorly evaluated by CT imaging. Stomach/Bowel: Distal esophagus, stomach and duodenal sweep are unremarkable. No small bowel wall thickening or dilatation. No evidence of obstruction. There are postsurgical changes at the tip of the cecum with several surgical clips however a short tubular structure extends from the cecal tip with a large 15 mm calcification at the base suggesting a dilated, hyperemic appendiceal stump. Minimal stranding is seen  adjacent to this location. Remainder of the colon has a more normal appearance. No colonic dilatation or wall thickening. Vascular/Lymphatic: Atherosclerotic plaque is seen throughout the aorta and branch vessels. Mild fusiform ectasia of the infrarenal abdominal aorta to 2.6 cm. Reproductive: Uterus is surgically absent. No concerning adnexal lesions. Other: Mild circumferential body wall edema. No bowel containing hernia. Small volume low-attenuation free fluid tracking inferiorly to the deep pelvis, likely reactive. No free intraperitoneal air. Musculoskeletal: Postsurgical changes from prior L4-S1 posterior spinal fusion with transpedicular screws at the L4 and S1 levels and bony fusion across the posterior elements. Multilevel degenerative changes are present in the imaged portions of the spine. Adjacent segment disease noted at L3-4. Mild levocurvature of the lumbar spine. IMPRESSION: 1. Severe right hydronephrosis to the level of a 11 mm calculus at the right ureteropelvic junction. Extensive adjacent retroperitoneal stranding and inflammation. 2. Postsurgical changes at the tip of the cecum compatible with reported history of appendectomy however a short tubular structure extending from the cecal tip measuring 2.7 cm in diameter with a large 15 mm calcification at the base suggesting a dilated, hyperemic appendiceal stump. Minimal stranding is seen adjacent to this location. Findings are concerning for acute appendicitis. Recommend surgical consultation. Additionally given size of this dilation, a mucocele is not fully excluded. 3. Gradient density within the gallbladder lumen likely reflects biliary sludge. No visible calcified gallstones or gallbladder wall thickening. Borderline biliary ductal dilatation. If there is clinical concern for choledocholithiasis or acute cholecystitis, consider further evaluation with right upper quadrant ultrasound. 4. Mild fusiform ectasia of the infrarenal abdominal aorta  to 2.6 cm. Ectatic abdominal aorta at risk for aneurysm development. Recommend followup by ultrasound in 5 years. This recommendation follows ACR consensus guidelines: White Paper of the ACR Incidental Findings Committee II on Vascular Findings. J Am Coll Radiol 2013; 10:789-794. Aortic aneurysm NOS (ICD10-I71.9) 5. Circumferential body wall edema, trace pericardial fluid, and small volume of fluid in the deep pelvis. Features suggesting mild anasarca/volume third-spacing. 6. Aortic Atherosclerosis (ICD10-I70.0). 7. Prior L4-S1 posterior spinal fusion without hardware failure or loosening. These results were called by telephone at the time of interpretation on 12/13/2018 at 2:18 am to provider [facility] , who verbally acknowledged these results. Electronically Signed   By: Kreg Shropshire M.D.   On: 12/13/2018 02:27   Dg Chest 2 View  Result Date: 12/13/2018 CLINICAL DATA:  Acute renal failure EXAM: CHEST - 2 VIEW COMPARISON:  None. FINDINGS: Tiny bilateral pleural effusions. No consolidation. Normal heart size. Aortic atherosclerosis. No pneumothorax. IMPRESSION: Tiny bilateral pleural effusions. Electronically Signed   By: Jasmine Pang M.D.   On: 12/13/2018 01:52   Dg C-arm 1-60 Min-no Report  Result Date: 12/13/2018 Fluoroscopy was utilized by the requesting physician.  No radiographic interpretation.   US Abdomen Limited Ruq  Result Date: 12/13/2018 CLINICAL DATA:  Dilated CBD seen on same day CT EXAM: ULTRASOUND ABDOMEN LIMITED RIGHT UPPER QUADRANT COMPARISON:  CT 12/13/2018 FINDINGS: Gallbladder: No sonographically evident gallstones or biliary sludge. No gallbladder wall thickening. Sonographic Eulah Pont sign  is reportedly negative per sonographer. Prominent fold at the neck of the gallbladder. Common bile duct: Diameter: 8.3 mm, dilated Liver: No focal lesion identified. Within normal limits in parenchymal echogenicity. Portal vein is patent on color Doppler imaging with normal direction of  blood flow towards the liver. Other: None. IMPRESSION: Mild nonspecific extrahepatic biliary ductal dilatation without visible choledocholithiasis or biliary sludge. Recommend correlation with serologies and if there is persisting clinical concern for choledocholithiasis, MRCP could be obtained. Electronically Signed   By: Lovena Le M.D.   On: 12/13/2018 03:26   . Chlorhexidine Gluconate Cloth  6 each Topical Daily  . cholecalciferol  1,000 Units Oral Daily  . fluticasone  1 spray Each Nare BID  . hydrALAZINE  25 mg Oral Q8H  . multivitamin with minerals  1 tablet Oral Daily  . sodium bicarbonate  50 mEq Intravenous Once  . sodium chloride flush  3 mL Intravenous Once  . sodium zirconium cyclosilicate  10 g Oral Once    BMET    Component Value Date/Time   NA 141 12/14/2018 0637   NA 141 11/28/2018 1517   K 5.1 12/14/2018 0637   CL 108 12/14/2018 0637   CO2 20 (L) 12/14/2018 0637   GLUCOSE 83 12/14/2018 0637   BUN 60 (H) 12/14/2018 0637   BUN 17 11/28/2018 1517   CREATININE 5.94 (H) 12/14/2018 0637   CALCIUM 8.6 (L) 12/14/2018 0637   GFRNONAA 7 (L) 12/14/2018 0637   GFRAA 8 (L) 12/14/2018 0637   CBC    Component Value Date/Time   WBC 8.6 12/14/2018 0637   RBC 3.39 (L) 12/14/2018 0637   HGB 9.8 (L) 12/14/2018 0637   HCT 30.8 (L) 12/14/2018 0637   PLT 378 12/14/2018 0637   MCV 90.9 12/14/2018 0637   MCH 28.9 12/14/2018 0637   MCHC 31.8 12/14/2018 0637   RDW 14.7 12/14/2018 0637   LYMPHSABS 1.2 12/14/2018 0637   MONOABS 0.7 12/14/2018 0637   EOSABS 0.0 12/14/2018 0637   BASOSABS 0.0 12/14/2018 0637     Assessment/Plan:  1. 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  Improved UOP and Scr since she underwent cystoscopy and double-j stent placement 2. Obstructing right UPJ stone s/p double - j stent placement.  Urology following 3. Pyelonephtiris: E coli and E. Faecalis from urine.  On cefepime 4. HTN- stable 5. CBD dilation  with abnormal LFT's and abdominal pain.  Surgery and GI following 6. Hyperkalemia- markedly improved after obstruction was relieved. 7. 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

## 2018-12-14 NOTE — Progress Notes (Addendum)
[facility]/Trauma Progress Note  1 Day Post-Op   Assessment/Plan Chronic back pain - home narcotic use  Abdominal pain & Nausea Enlarged appendiceal stump - hx of appendectomy, recommend antibiotics and colonoscopy non acutely to evaluate the appendiceal orifice RUQ pain with mildly dilated common bile duct seen on US, no visible choledocholithiasis or biliary sludge. Pain could have been 2/2 kidney stone as pt is non tender in RUQ on exam this am. Do not suspect acute cholecystitis Obstructing UPJ stone with acute kidney injury in setting of contralateral  L atrophic kidney - S/P Cystoscopy, right retrograde ureteropyelogram, fluoroscopic interpretation, placement of 6 French by 24 cm contour double-J stent without tether, Dr. Retta Dionesahlstedt, 12/13/18 - per urology   FEN: renal carb mod VTE: SCD's, chem prophylaxis per medicine  ID: maxipime 10/15>> Foley: yes per urology Follow up: TBD  DISPO: agree with antibiotics. See if pt tolerates diet. We will follow. No emergent surgical intervention indicated at this time.     LOS: 1 day    Subjective: CC: R sided abdominal pain  Pt state her back is her main source of pain today. She has chronic back pain which she takes narcotics for. She denies nausea, vomiting, fever or chills overnight. She is not having RUQ abdominal pain.   Objective: Vital signs in last 24 hours: Temp:  [97.5 F (36.4 C)-98.5 F (36.9 C)] 97.8 F (36.6 C) (10/16 0600) Pulse Rate:  [62-99] 69 (10/16 0600) Resp:  [9-24] 18 (10/16 0600) BP: (118-152)/(35-83) 146/62 (10/16 0600) SpO2:  [91 %-100 %] 99 % (10/16 0600) Last BM Date: 12/14/18  Intake/Output from previous day: 10/15 0701 - 10/16 0700 In: 2562.6 [P.O.:620; I.V.:1592.5; IV Piggyback:100.1] Out: 4350 [Urine:4350] Intake/Output this shift: No intake/output data recorded.  PE: Gen:  Alert, NAD, pleasant, cooperative Pulm:  Rate and effort normal Abd: Soft, ND, +BS, no HSM, no RUQ  tenderness, no Murphy's sign. TTP of RLQ with guarding, no peritonitis  Skin: no rashes noted, warm and dry   Anti-infectives: Anti-infectives (From admission, onward)   Start     Dose/Rate Route Frequency Ordered Stop   12/13/18 1800  ceFEPIme (MAXIPIME) 1 g in sodium chloride 0.9 % 100 mL IVPB     1 g 200 mL/hr over 30 Minutes Intravenous Every 24 hours 12/13/18 1739     12/13/18 0815  ciprofloxacin (CIPRO) IVPB 400 mg     400 mg 200 mL/hr over 60 Minutes Intravenous To Surgery 12/13/18 0811 12/13/18 0816      Lab Results:  Recent Labs    12/13/18 1339 12/14/18 0637  WBC 7.8 8.6  HGB 10.5* 9.8*  HCT 32.9* 30.8*  PLT 336 378   BMET Recent Labs    12/13/18 1339 12/14/18 0637  NA 139 141  K 5.9* 5.1  CL 103 108  CO2 14* 20*  GLUCOSE 136* 83  BUN 79* 60*  CREATININE 9.54* 5.94*  CALCIUM 9.0 8.6*   PT/INR Recent Labs    12/13/18 0318  LABPROT 14.7  INR 1.2   CMP     Component Value Date/Time   NA 141 12/14/2018 0637   NA 141 11/28/2018 1517   K 5.1 12/14/2018 0637   CL 108 12/14/2018 0637   CO2 20 (L) 12/14/2018 0637   GLUCOSE 83 12/14/2018 0637   BUN 60 (H) 12/14/2018 0637   BUN 17 11/28/2018 1517   CREATININE 5.94 (H) 12/14/2018 0637   CALCIUM 8.6 (L) 12/14/2018 0637   PROT 6.0 (L) 12/14/2018 16100637  PROT 7.3 11/28/2018 1517   ALBUMIN 2.9 (L) 12/14/2018 0637   ALBUMIN 4.7 11/28/2018 1517   AST 27 12/14/2018 0637   ALT 13 12/14/2018 0637   ALKPHOS 53 12/14/2018 0637   BILITOT 1.4 (H) 12/14/2018 0637   BILITOT <0.2 11/28/2018 1517   GFRNONAA 7 (L) 12/14/2018 0637   GFRAA 8 (L) 12/14/2018 0637   Lipase     Component Value Date/Time   LIPASE 26 12/12/2018 1942    Studies/Results: Ct Abdomen Pelvis Wo Contrast  Result Date: 12/13/2018 CLINICAL DATA:  Abdominal distension with 6 weeks of intermittent nausea and vomiting EXAM: CT ABDOMEN AND PELVIS WITHOUT CONTRAST TECHNIQUE: Multidetector CT imaging of the abdomen and pelvis was performed  following the standard protocol without IV contrast. COMPARISON:  None FINDINGS: Lower chest: Bandlike areas of opacity in both lung bases likely reflect subsegmental atelectasis and/or scarring. Hepatobiliary: No focal liver abnormality is seen. Gradient density within the gallbladder lumen likely reflects biliary sludge. No visible calcified gallstones or gallbladder wall thickening. Common bile duct is borderline dilated at 8 mm. Pancreas: Fatty replacement of the pancreas. No pancreatic ductal dilatation or surrounding inflammatory changes. Spleen: Normal in size without focal abnormality. Adrenals/Urinary Tract: Normal adrenal glands. There is atrophy and extensive vascular calcification of the left kidney with a several fluid attenuation cysts. Additional hypoattenuating foci in the left kidney are too small to fully characterize on CT imaging but statistically likely benign. The right kidney is markedly enlarged, some of which may be compensatory hypertrophy however there is severe right hydronephrosis to the level of the 11 mm calculus at the right ureteropelvic junction. Distal ureter is more normal caliber. Urinary bladder is largely decompressed at the time of exam and therefore poorly evaluated by CT imaging. Stomach/Bowel: Distal esophagus, stomach and duodenal sweep are unremarkable. No small bowel wall thickening or dilatation. No evidence of obstruction. There are postsurgical changes at the tip of the cecum with several surgical clips however a short tubular structure extends from the cecal tip with a large 15 mm calcification at the base suggesting a dilated, hyperemic appendiceal stump. Minimal stranding is seen adjacent to this location. Remainder of the colon has a more normal appearance. No colonic dilatation or wall thickening. Vascular/Lymphatic: Atherosclerotic plaque is seen throughout the aorta and branch vessels. Mild fusiform ectasia of the infrarenal abdominal aorta to 2.6 cm.  Reproductive: Uterus is surgically absent. No concerning adnexal lesions. Other: Mild circumferential body wall edema. No bowel containing hernia. Small volume low-attenuation free fluid tracking inferiorly to the deep pelvis, likely reactive. No free intraperitoneal air. Musculoskeletal: Postsurgical changes from prior L4-S1 posterior spinal fusion with transpedicular screws at the L4 and S1 levels and bony fusion across the posterior elements. Multilevel degenerative changes are present in the imaged portions of the spine. Adjacent segment disease noted at L3-4. Mild levocurvature of the lumbar spine. IMPRESSION: 1. Severe right hydronephrosis to the level of a 11 mm calculus at the right ureteropelvic junction. Extensive adjacent retroperitoneal stranding and inflammation. 2. Postsurgical changes at the tip of the cecum compatible with reported history of appendectomy however a short tubular structure extending from the cecal tip measuring 2.7 cm in diameter with a large 15 mm calcification at the base suggesting a dilated, hyperemic appendiceal stump. Minimal stranding is seen adjacent to this location. Findings are concerning for acute appendicitis. Recommend surgical consultation. Additionally given size of this dilation, a mucocele is not fully excluded. 3. Gradient density within the gallbladder lumen likely reflects biliary  sludge. No visible calcified gallstones or gallbladder wall thickening. Borderline biliary ductal dilatation. If there is clinical concern for choledocholithiasis or acute cholecystitis, consider further evaluation with right upper quadrant ultrasound. 4. Mild fusiform ectasia of the infrarenal abdominal aorta to 2.6 cm. Ectatic abdominal aorta at risk for aneurysm development. Recommend followup by ultrasound in 5 years. This recommendation follows ACR consensus guidelines: White Paper of the ACR Incidental Findings Committee II on Vascular Findings. J Am Coll Radiol 2013; 10:789-794.  Aortic aneurysm NOS (ICD10-I71.9) 5. Circumferential body wall edema, trace pericardial fluid, and small volume of fluid in the deep pelvis. Features suggesting mild anasarca/volume third-spacing. 6. Aortic Atherosclerosis (ICD10-I70.0). 7. Prior L4-S1 posterior spinal fusion without hardware failure or loosening. These results were called by telephone at the time of interpretation on 12/13/2018 at 2:18 am to provider [facility] , who verbally acknowledged these results. Electronically Signed   By: Lovena Le M.D.   On: 12/13/2018 02:27   Dg Chest 2 View  Result Date: 12/13/2018 CLINICAL DATA:  Acute renal failure EXAM: CHEST - 2 VIEW COMPARISON:  None. FINDINGS: Tiny bilateral pleural effusions. No consolidation. Normal heart size. Aortic atherosclerosis. No pneumothorax. IMPRESSION: Tiny bilateral pleural effusions. Electronically Signed   By: Donavan Foil M.D.   On: 12/13/2018 01:52   Dg C-arm 1-60 Min-no Report  Result Date: 12/13/2018 Fluoroscopy was utilized by the requesting physician.  No radiographic interpretation.   US Abdomen Limited Ruq  Result Date: 12/13/2018 CLINICAL DATA:  Dilated CBD seen on same day CT EXAM: ULTRASOUND ABDOMEN LIMITED RIGHT UPPER QUADRANT COMPARISON:  CT 12/13/2018 FINDINGS: Gallbladder: No sonographically evident gallstones or biliary sludge. No gallbladder wall thickening. Sonographic Percell Miller sign is reportedly negative per sonographer. Prominent fold at the neck of the gallbladder. Common bile duct: Diameter: 8.3 mm, dilated Liver: No focal lesion identified. Within normal limits in parenchymal echogenicity. Portal vein is patent on color Doppler imaging with normal direction of blood flow towards the liver. Other: None. IMPRESSION: Mild nonspecific extrahepatic biliary ductal dilatation without visible choledocholithiasis or biliary sludge. Recommend correlation with serologies and if there is persisting clinical concern for choledocholithiasis, MRCP  could be obtained. Electronically Signed   By: Lovena Le M.D.   On: 12/13/2018 03:26     Kalman Drape, [facility] Surgery Pager [phone] Cristine Polio, & Friday 7:00am - 4:30pm Thursdays 7:00am -11:30am

## 2018-12-15 LAB — CBC
HCT: 30 % — ABNORMAL LOW (ref 36.0–46.0)
Hemoglobin: 9.4 g/dL — ABNORMAL LOW (ref 12.0–15.0)
MCH: 28.5 pg (ref 26.0–34.0)
MCHC: 31.3 g/dL (ref 30.0–36.0)
MCV: 90.9 fL (ref 80.0–100.0)
Platelets: 354 10*3/uL (ref 150–400)
RBC: 3.3 MIL/uL — ABNORMAL LOW (ref 3.87–5.11)
RDW: 14.8 % (ref 11.5–15.5)
WBC: 7.6 10*3/uL (ref 4.0–10.5)
nRBC: 0 % (ref 0.0–0.2)

## 2018-12-15 LAB — C4 COMPLEMENT: Complement C4, Body Fluid: 32 mg/dL (ref 14–44)

## 2018-12-15 LAB — C3 COMPLEMENT: C3 Complement: 173 mg/dL — ABNORMAL HIGH (ref 82–167)

## 2018-12-15 LAB — ANTINUCLEAR ANTIBODIES, IFA: ANA Ab, IFA: NEGATIVE

## 2018-12-15 MED ORDER — FUROSEMIDE 20 MG PO TABS: 20.0000 mg | ORAL_TABLET | Freq: Every day | ORAL | 0 refills | Status: AC

## 2018-12-15 MED ORDER — CEFDINIR 300 MG PO CAPS: 300.0000 mg | ORAL_CAPSULE | Freq: Two times a day (BID) | ORAL | 0 refills | Status: DC

## 2018-12-15 MED ORDER — HYDRALAZINE HCL 25 MG PO TABS: 25.0000 mg | ORAL_TABLET | Freq: Three times a day (TID) | ORAL | 0 refills | Status: DC

## 2018-12-15 NOTE — Discharge Summary (Signed)
Physician Discharge Summary  Mary [name] SAY:[id number] DOB: 19-May-1950 DOA: 12/12/2018  PCP: Daisy Floro, MD  Admit date: 12/12/2018 Discharge date: 12/15/2018  Recommendations for Outpatient Follow-up:  1. Follow up with urology as outpatient as directed. 2. Follow up with PCP in 7-10 days. 3. Chemistry to be drawn in 7 days and reported to PCP. 4. PCP to refer back to nephrology if renal function does not return to normal. 5. Follow up with Dr. Marca Ancona as directed for colonoscopy.  Follow-up Information    Manus Rudd, MD. Schedule an appointment as soon as possible for a visit in 3 week(s).   Specialty: General Surgery Why: after your colonoscopy to discuss appendiceal stump Contact information: [address] [place] [address] [phone]           Discharge Diagnoses: Principal diagnosis is #1 1. AKI on CKD 3 2. Pyelonephritis - E. Coli and E faecalis 3. 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 S/P Cystoscopy and stent placement by Dr. Lynnae Sandhoff. 4. Abdominal pain 5. Transiently elevated bilirubin with mildly dilated CBD 6. Abnormal appearance of appendiceal stump on CT abdomen with concern for inflammation vs mucocele 7. Anxiety 8. Hypertension 9. Hyperkalemia due to AKI 10. Nausea/vomiting/diarrhea   Discharge Condition: Fair  Disposition: Home  Diet recommendation: Heart Healthy  There were no vitals filed for this visit.  History of present illness:  Mary [name] is a 68 y.o. female with medical history significant of hypertension, hyperlipidemia, asthma, GERD, anxiety, Sjogren's syndrome, ataxic, chronic pain syndrome, CKD stage III, kidney stone (left kidney stone surgery 2008), who presents with nausea, vomiting, diarrhea, abdominal pain, ataxia.  Pt state that she has been having intermittent nausea, vomiting, diarrhea for more than 6 weeks.  She she has had nonbilious nonbloody vomitus 6 times today.  She has had 4-5 times of  watery and loose stool bowel movement today.  Patient states that she has intermittent central abdominal pain. During the same period of time, patient has been having ataxia.  Described as unsteady gait particularly on turning around.  Patient denies unilateral weakness, numbness or tingling in the extremities.  No facial droop or slurred speech. She had seen ENT and neurology for ataxia with negative work-up thus far. They plan to do MRI of brain, but it is not done yet. She state that she had fever 101.8 three days ago.  Currently no fever or chills.  Patient denies chest pain or cough.  She states that sometimes she has some mild shortness of breath.  She denies symptoms of UTI, no hematuria.    ED Course: pt was found to have WBC 8.6, lipase 29, pending COVID-19 test, pending CK, potassium 6.2, bicarbonate of 14, creatinine 11.15, BUN 87 (creatinine 1.13 on 11/28/2018), liver function (ALP 74, AST 20, ALT 13, total bilirubin 4.3), temperature normal, blood pressure 165/74, heart rate 86, oxygen saturation 98% on room air.  Chest x-ray showed tiny bilateral pleural effusion. CT- abdomen/pelvis showed left atrophic kidney, right hydronephrosis, right obstructing ureteral pelvic junction stone, inflammation of appendix stump, and biliary sludge and CBD dilation.  Patient is admitted to telemetry bed as inpatient.  ED physician consulted nephrology, Dr. Juel Burrow, general surgeon, Dr. Donell Beers nd urologist, Dr. Ilsa Iha.  Hospital Course:  The patient was taken to the OR by Dr. Lynnae Sandhoff on 12/12/2018 and performed a cystoscopy with ureterostomy and stent placement.   I have consulted [facility] due to the CT findings of CBD dilatation, elevated bilirubin, and abnormal appearance of appendiceal  stump on CT. The patient was seen by Dr. Marca AnconaKarki and will follow up with her as outpatient with colonoscopy.  She received 3 days of IV Cefepime. She has been cleared for discharge and will be discharged on PO  cefdinir to follow up with urology as outpatient.  Today's assessment: S: The patient is resting comfortably. No new complaints. O: Vitals:  Vitals:   12/15/18 0420 12/15/18 1314  BP: 135/60 (!) 167/60  Pulse: 70 60  Resp: 19 18  Temp: 99 F (37.2 C) 98.7 F (37.1 C)  SpO2: 95% 100%   Constitutional:   The patient is awake, alert, and oriented x 3. No acute distress. Respiratory:   No increased work of breathing.  No wheezes, rales, or rhonchi  No tactile fremitus Cardiovascular:   Regular rate and rhythm  No murmurs, ectopy, or gallups.  No lateral PMI. No thrills. Abdomen:   Abdomen is soft, non-tender, non-distended  No hernias, masses, or organomegaly  Normoactive bowel sounds.  Musculoskeletal:   No cyanosis, clubbing, or edema Skin:   No rashes, lesions, ulcers  palpation of skin: no induration or nodules Neurologic:   CN 2-12 intact  Sensation all 4 extremities intact Psychiatric:   Mental status ? Mood, affect appropriate ? Patient is confused. Discharge Instructions  Discharge Instructions    Activity as tolerated - No restrictions   Complete by: As directed    Call MD for:  persistant nausea and vomiting   Complete by: As directed    Call MD for:  severe uncontrolled pain   Complete by: As directed    Call MD for:  temperature >100.4   Complete by: As directed    Diet - low sodium heart healthy   Complete by: As directed    Discharge instructions   Complete by: As directed    Follow up with urology as outpatient as directed. Follow up with PCP in 7-10 days. Chemistry to be drawn in 7 days and reported to PCP. PCP to refer back to nephrology if renal function does not return to normal. Follow up with Dr. Marca AnconaKarki as directed for colonoscopy.   Increase activity slowly   Complete by: As directed      Allergies as of 12/15/2018      Reactions   Sulfa Antibiotics Nausea And Vomiting   Adhesive [tape]    Celebrex [celecoxib]     Lyrica [pregabalin]    Macrobid [nitrofurantoin Macrocrystal]    Penicillins    Tribenzor [olmesartan-amlodipine-hctz]    dizziness   Tricor [fenofibrate]    Vioxx [rofecoxib]    Generally not well while taking this   Cephalexin    Other reaction(s): Abdominal Pain      Medication List    STOP taking these medications   lisinopril 40 MG tablet Commonly known as: ZESTRIL   morphine 30 MG 12 hr tablet Commonly known as: MS CONTIN     TAKE these medications   butalbital-acetaminophen-caffeine 50-325-40 MG tablet Commonly known as: FIORICET Take 1 tablet by mouth every 6 (six) hours as needed for headache.   cefdinir 300 MG capsule Commonly known as: OMNICEF Take 1 capsule (300 mg total) by mouth 2 (two) times daily.   cholecalciferol 25 MCG (1000 UT) tablet Commonly known as: VITAMIN D3 Take 1,000 Units by mouth daily.   Flonase 50 MCG/ACT nasal spray Generic drug: fluticasone Place 1 spray into both nostrils 2 (two) times daily.   furosemide 20 MG tablet Commonly known as: LASIX Take 1  tablet (20 mg total) by mouth daily. What changed: See the new instructions.   hydrALAZINE 25 MG tablet Commonly known as: APRESOLINE Take 1 tablet (25 mg total) by mouth every 8 (eight) hours.   hydrOXYzine 25 MG tablet Commonly known as: ATARAX/VISTARIL Take 25 mg by mouth every 8 (eight) hours as needed for anxiety.   Melatonin 3 MG Tabs Take 3 mg by mouth at bedtime as needed (for sleep).   methocarbamol 500 MG tablet Commonly known as: ROBAXIN Take 1,000 mg by mouth 4 (four) times daily.   multivitamin tablet Take 1 tablet by mouth daily.   oxyCODONE-acetaminophen 7.5-325 MG tablet Commonly known as: PERCOCET Take 1 tablet by mouth 3 (three) times daily as needed for severe pain.      Allergies  Allergen Reactions   Sulfa Antibiotics Nausea And Vomiting   Adhesive [Tape]    Celebrex [Celecoxib]    Lyrica [Pregabalin]    Macrobid [Nitrofurantoin  Macrocrystal]    Penicillins    Tribenzor [Olmesartan-Amlodipine-Hctz]     dizziness   Tricor [Fenofibrate]    Vioxx [Rofecoxib]     Generally not well while taking this   Cephalexin     Other reaction(s): Abdominal Pain    The results of significant diagnostics from this hospitalization (including imaging, microbiology, ancillary and laboratory) are listed below for reference.    Significant Diagnostic Studies: Ct Abdomen Pelvis Wo Contrast  Result Date: 12/13/2018 CLINICAL DATA:  Abdominal distension with 6 weeks of intermittent nausea and vomiting EXAM: CT ABDOMEN AND PELVIS WITHOUT CONTRAST TECHNIQUE: Multidetector CT imaging of the abdomen and pelvis was performed following the standard protocol without IV contrast. COMPARISON:  None FINDINGS: Lower chest: Bandlike areas of opacity in both lung bases likely reflect subsegmental atelectasis and/or scarring. Hepatobiliary: No focal liver abnormality is seen. Gradient density within the gallbladder lumen likely reflects biliary sludge. No visible calcified gallstones or gallbladder wall thickening. Common bile duct is borderline dilated at 8 mm. Pancreas: Fatty replacement of the pancreas. No pancreatic ductal dilatation or surrounding inflammatory changes. Spleen: Normal in size without focal abnormality. Adrenals/Urinary Tract: Normal adrenal glands. There is atrophy and extensive vascular calcification of the left kidney with a several fluid attenuation cysts. Additional hypoattenuating foci in the left kidney are too small to fully characterize on CT imaging but statistically likely benign. The right kidney is markedly enlarged, some of which may be compensatory hypertrophy however there is severe right hydronephrosis to the level of the 11 mm calculus at the right ureteropelvic junction. Distal ureter is more normal caliber. Urinary bladder is largely decompressed at the time of exam and therefore poorly evaluated by CT imaging.  Stomach/Bowel: Distal esophagus, stomach and duodenal sweep are unremarkable. No small bowel wall thickening or dilatation. No evidence of obstruction. There are postsurgical changes at the tip of the cecum with several surgical clips however a short tubular structure extends from the cecal tip with a large 15 mm calcification at the base suggesting a dilated, hyperemic appendiceal stump. Minimal stranding is seen adjacent to this location. Remainder of the colon has a more normal appearance. No colonic dilatation or wall thickening. Vascular/Lymphatic: Atherosclerotic plaque is seen throughout the aorta and branch vessels. Mild fusiform ectasia of the infrarenal abdominal aorta to 2.6 cm. Reproductive: Uterus is surgically absent. No concerning adnexal lesions. Other: Mild circumferential body wall edema. No bowel containing hernia. Small volume low-attenuation free fluid tracking inferiorly to the deep pelvis, likely reactive. No free intraperitoneal air. Musculoskeletal: Postsurgical changes  from prior L4-S1 posterior spinal fusion with transpedicular screws at the L4 and S1 levels and bony fusion across the posterior elements. Multilevel degenerative changes are present in the imaged portions of the spine. Adjacent segment disease noted at L3-4. Mild levocurvature of the lumbar spine. IMPRESSION: 1. Severe right hydronephrosis to the level of a 11 mm calculus at the right ureteropelvic junction. Extensive adjacent retroperitoneal stranding and inflammation. 2. Postsurgical changes at the tip of the cecum compatible with reported history of appendectomy however a short tubular structure extending from the cecal tip measuring 2.7 cm in diameter with a large 15 mm calcification at the base suggesting a dilated, hyperemic appendiceal stump. Minimal stranding is seen adjacent to this location. Findings are concerning for acute appendicitis. Recommend surgical consultation. Additionally given size of this dilation, a  mucocele is not fully excluded. 3. Gradient density within the gallbladder lumen likely reflects biliary sludge. No visible calcified gallstones or gallbladder wall thickening. Borderline biliary ductal dilatation. If there is clinical concern for choledocholithiasis or acute cholecystitis, consider further evaluation with right upper quadrant ultrasound. 4. Mild fusiform ectasia of the infrarenal abdominal aorta to 2.6 cm. Ectatic abdominal aorta at risk for aneurysm development. Recommend followup by ultrasound in 5 years. This recommendation follows ACR consensus guidelines: White Paper of the ACR Incidental Findings Committee II on Vascular Findings. J Am Coll Radiol 2013; 10:789-794. Aortic aneurysm NOS (ICD10-I71.9) 5. Circumferential body wall edema, trace pericardial fluid, and small volume of fluid in the deep pelvis. Features suggesting mild anasarca/volume third-spacing. 6. Aortic Atherosclerosis (ICD10-I70.0). 7. Prior L4-S1 posterior spinal fusion without hardware failure or loosening. These results were called by telephone at the time of interpretation on 12/13/2018 at 2:18 am to provider [facility] , who verbally acknowledged these results. Electronically Signed   By: Lovena Le M.D.   On: 12/13/2018 02:27   Dg Chest 2 View  Result Date: 12/13/2018 CLINICAL DATA:  Acute renal failure EXAM: CHEST - 2 VIEW COMPARISON:  None. FINDINGS: Tiny bilateral pleural effusions. No consolidation. Normal heart size. Aortic atherosclerosis. No pneumothorax. IMPRESSION: Tiny bilateral pleural effusions. Electronically Signed   By: Donavan Foil M.D.   On: 12/13/2018 01:52   Dg C-arm 1-60 Min-no Report  Result Date: 12/13/2018 Fluoroscopy was utilized by the requesting physician.  No radiographic interpretation.   US Abdomen Limited Ruq  Result Date: 12/13/2018 CLINICAL DATA:  Dilated CBD seen on same day CT EXAM: ULTRASOUND ABDOMEN LIMITED RIGHT UPPER QUADRANT COMPARISON:  CT 12/13/2018  FINDINGS: Gallbladder: No sonographically evident gallstones or biliary sludge. No gallbladder wall thickening. Sonographic Percell Miller sign is reportedly negative per sonographer. Prominent fold at the neck of the gallbladder. Common bile duct: Diameter: 8.3 mm, dilated Liver: No focal lesion identified. Within normal limits in parenchymal echogenicity. Portal vein is patent on color Doppler imaging with normal direction of blood flow towards the liver. Other: None. IMPRESSION: Mild nonspecific extrahepatic biliary ductal dilatation without visible choledocholithiasis or biliary sludge. Recommend correlation with serologies and if there is persisting clinical concern for choledocholithiasis, MRCP could be obtained. Electronically Signed   By: Lovena Le M.D.   On: 12/13/2018 03:26    Microbiology: Recent Results (from the past 240 hour(s))  SARS Coronavirus 2 by RT PCR (hospital order, performed in [facility] hospital lab) Nasopharyngeal Nasopharyngeal Swab     Status: None   Collection Time: 12/13/18  4:05 AM   Specimen: Nasopharyngeal Swab  Result Value Ref Range Status   SARS Coronavirus 2 NEGATIVE NEGATIVE  Final    Comment: (NOTE) If result is NEGATIVE SARS-CoV-2 target nucleic acids are NOT DETECTED. The SARS-CoV-2 RNA is generally detectable in upper and lower  respiratory specimens during the acute phase of infection. The lowest  concentration of SARS-CoV-2 viral copies this assay can detect is 250  copies / mL. A negative result does not preclude SARS-CoV-2 infection  and should not be used as the sole basis for treatment or other  patient management decisions.  A negative result may occur with  improper specimen collection / handling, submission of specimen other  than nasopharyngeal swab, presence of viral mutation(s) within the  areas targeted by this assay, and inadequate number of viral copies  (<250 copies / mL). A negative result must be combined with clinical  observations,  patient history, and epidemiological information. If result is POSITIVE SARS-CoV-2 target nucleic acids are DETECTED. The SARS-CoV-2 RNA is generally detectable in upper and lower  respiratory specimens dur ing the acute phase of infection.  Positive  results are indicative of active infection with SARS-CoV-2.  Clinical  correlation with patient history and other diagnostic information is  necessary to determine patient infection status.  Positive results do  not rule out bacterial infection or co-infection with other viruses. If result is PRESUMPTIVE POSTIVE SARS-CoV-2 nucleic acids MAY BE PRESENT.   A presumptive positive result was obtained on the submitted specimen  and confirmed on repeat testing.  While 2019 novel coronavirus  (SARS-CoV-2) nucleic acids may be present in the submitted sample  additional confirmatory testing may be necessary for epidemiological  and / or clinical management purposes  to differentiate between  SARS-CoV-2 and other Sarbecovirus currently known to infect humans.  If clinically indicated additional testing with an alternate test  methodology [phone]) is advised. The SARS-CoV-2 RNA is generally  detectable in upper and lower respiratory sp ecimens during the acute  phase of infection. The expected result is Negative. Fact Sheet for Patients:  BoilerBrush.com.cy Fact Sheet for Healthcare Providers: https://pope.com/ This test is not yet approved or cleared by the [place] FDA and has been authorized for detection and/or diagnosis of SARS-CoV-2 by FDA under an Emergency Use Authorization (EUA).  This EUA will remain in effect (meaning this test can be used) for the duration of the COVID-19 declaration under Section 564(b)(1) of the Act, 21 U.S.C. section 360bbb-3(b)(1), unless the authorization is terminated or revoked sooner. Performed at [facility], [address], [place],  [place] [address]      Labs: Basic Metabolic Panel: Recent Labs  Lab 12/12/18 1942 12/13/18 0422 12/13/18 1339 12/14/18 0637 12/15/18 0346  NA 134* 135 139 141 141  K 6.2* 5.1 5.9* 5.1 4.3  CL 96* 97* 103 108 111  CO2 14* 13* 14* 20* 20*  GLUCOSE 133* 95 136* 83 87  BUN 87* 90* 79* 60* 37*  CREATININE 11.15* 11.50* 9.54* 5.94* 2.76*  CALCIUM 8.9 8.5* 9.0 8.6* 8.3*  PHOS  --   --   --   --  2.7   Liver Function Tests: Recent Labs  Lab 12/12/18 1942 12/13/18 1339 12/14/18 0637 12/15/18 0346  AST --   ALT --   ALKPHOS 74 70 53  --   BILITOT 4.3* 3.0* 1.4*  --   PROT 7.7 6.6 6.0*  --   ALBUMIN 3.5 3.0* 2.9* 2.6*   Recent Labs  Lab 12/12/18 1942  LIPASE 26   No results for input(s):  AMMONIA in the last 168 hours. CBC: Recent Labs  Lab 12/12/18 1942 12/13/18 1339 12/14/18 0637 12/15/18 0346  WBC 8.6 7.8 8.6 7.6  NEUTROABS  --   --  6.6  --   HGB 11.7* 10.5* 9.8* 9.4*  HCT 36.5 32.9* 30.8* 30.0*  MCV 90.1 90.6 90.9 90.9  PLT 311 336 378 354   Cardiac Enzymes: Recent Labs  Lab 12/13/18 0422 12/13/18 0529  CKTOTAL 198 259*   BNP: BNP (last 3 results) No results for input(s): BNP in the last 8760 hours.  ProBNP (last 3 results) No results for input(s): PROBNP in the last 8760 hours.  CBG: Recent Labs  Lab 12/13/18 0302 12/13/18 0413 12/13/18 0456 12/13/18 0924  GLUCAP 218* 84 66* 104*    Principal Problem:   Acute renal failure superimposed on stage 3 chronic kidney disease (HCC) Active Problems:   Anxiety   Essential hypertension   Ataxia   Elevated bilirubin   Hyperkalemia   Nausea vomiting and diarrhea   Choledocholithiasis   Hydronephrosis with ureteropelvic junction obstruction (CODE)   Abdominal pain   Chronic pain syndrome   Time coordinating discharge: 38 minutes  Signed:        Raheim Beutler, DO [facility]  12/15/2018, 6:14 PM

## 2018-12-15 NOTE — Final Consult Note (Signed)
Consultant Final Sign-Off Note    Assessment/Final recommendations  Mary [name] is a 68 y.o. female followed by me for   Abdominal pain & Nausea Enlarged appendiceal stump - hx of appendectomy, recommend antibiotics and colonoscopy non acutely to evaluate the appendiceal orifice.  No indication for surgery at this time. - GI arranging outpatient colonoscopy RUQ pain with mildly dilated common bile duct seen on [place], no visible choledocholithiasis or biliary sludge. Pain could have been 2/2 kidney stone as pt is non tender in RUQ on exam this am. Do not suspect acute cholecystitis Obstructing UPJ stone with acute kidney injury in setting of contralateral L atrophic kidney - S/P Cystoscopy, right retrograde ureteropyelogram, fluoroscopic interpretation, placement of 6 French by 24 cm contour double-J stent without tether, Dr. Diona Fanti, 12/13/18 - per urology    Wound care (if applicable):    Diet at discharge: per primary team   Activity at discharge: per primary team   Follow-up appointment: Will instruct patient to schedule an appointment with [place] after colonoscopy   Pending results:  Unresulted Labs (From admission, onward)    Start     Ordered   12/13/18 1253  GI pathogen panel by PCR, stool  (Gastrointestinal Panel by PCR, Stool                                                                                                                                                     *Does Not include CLOSTRIDIUM DIFFICILE testing.**If CDIFF testing is needed, select the C Difficile Quick Screen w PCR reflex order below)  Once,   R     12/13/18 1253   12/13/18 0529  Protein / creatinine ratio, urine  Once,   STAT     12/13/18 0529   12/13/18 0529  ANA, IFA (with reflex)  Once,   STAT     12/13/18 0529   12/12/18 1918  Urinalysis, Routine w reflex microscopic  ONCE - STAT,   STAT     12/12/18 1918           Medication recommendations: Antibiotics for 7 to 10 days for possible  appendiceal stump infection   Other recommendations:    Thank you for allowing [place] to participate in the care of your patient!  Please consult [place] again if you have further needs for your patient.  Kalman Drape 12/15/2018 8:52 AM    Subjective   CC: Back pain  Patient denies abdominal pain medicine.  She denies nausea, vomiting, fever or chills overnight.  She is having flatus.  She would like to go home soon.  Objective  Vital signs in last 24 hours: Temp:  [99 F (37.2 C)-99.7 F (37.6 C)] 99 F (37.2 C) (10/17 0420) Pulse Rate:  [66-70] 70 (10/17 0420) Resp:  [19-20] 19 (10/17 0420) BP: (130-135)/(60-65) 135/60 (10/17 0420)  SpO2:  [94 %-95 %] 95 % (10/17 0420)  PE: Gen:  Alert, NAD, pleasant, cooperative Pulm:  Rate and effort normal Abd: Soft, ND, +BS, no HSM, no RUQ tenderness.  Very mild TTP of RLQ without guarding, no peritonitis  Skin: no rashes noted, warm and dry  Pertinent labs and Studies: Recent Labs    12/13/18 1339 12/14/18 0637 12/15/18 0346  WBC 7.8 8.6 7.6  HGB 10.5* 9.8* 9.4*  HCT 32.9* 30.8* 30.0*   BMET Recent Labs    12/14/18 0637 12/15/18 0346  NA 141 141  K 5.1 4.3  CL 108 111  CO2 20* 20*  GLUCOSE 83 87  BUN 60* 37*  CREATININE 5.94* 2.76*  CALCIUM 8.6* 8.3*   No results for input(s): LABURIN in the last 72 hours. Results for orders placed or performed during the hospital encounter of 12/12/18  SARS Coronavirus 2 by RT PCR (hospital order, performed in [facility] hospital lab) Nasopharyngeal Nasopharyngeal Swab     Status: None   Collection Time: 12/13/18  4:05 AM   Specimen: Nasopharyngeal Swab  Result Value Ref Range Status   SARS Coronavirus 2 NEGATIVE NEGATIVE Final    Comment: (NOTE) If result is NEGATIVE SARS-CoV-2 target nucleic acids are NOT DETECTED. The SARS-CoV-2 RNA is generally detectable in upper and lower  respiratory specimens during the acute phase of infection. The lowest  concentration of SARS-CoV-2  viral copies this assay can detect is 250  copies / mL. A negative result does not preclude SARS-CoV-2 infection  and should not be used as the sole basis for treatment or other  patient management decisions.  A negative result may occur with  improper specimen collection / handling, submission of specimen other  than nasopharyngeal swab, presence of viral mutation(s) within the  areas targeted by this assay, and inadequate number of viral copies  (<250 copies / mL). A negative result must be combined with clinical  observations, patient history, and epidemiological information. If result is POSITIVE SARS-CoV-2 target nucleic acids are DETECTED. The SARS-CoV-2 RNA is generally detectable in upper and lower  respiratory specimens dur ing the acute phase of infection.  Positive  results are indicative of active infection with SARS-CoV-2.  Clinical  correlation with patient history and other diagnostic information is  necessary to determine patient infection status.  Positive results do  not rule out bacterial infection or co-infection with other viruses. If result is PRESUMPTIVE POSTIVE SARS-CoV-2 nucleic acids MAY BE PRESENT.   A presumptive positive result was obtained on the submitted specimen  and confirmed on repeat testing.  While 2019 novel coronavirus  (SARS-CoV-2) nucleic acids may be present in the submitted sample  additional confirmatory testing may be necessary for epidemiological  and / or clinical management purposes  to differentiate between  SARS-CoV-2 and other Sarbecovirus currently known to infect humans.  If clinically indicated additional testing with an alternate test  methodology [phone]) is advised. The SARS-CoV-2 RNA is generally  detectable in upper and lower respiratory sp ecimens during the acute  phase of infection. The expected result is Negative. Fact Sheet for Patients:  BoilerBrush.com.cy Fact Sheet for Healthcare  Providers: https://pope.com/ This test is not yet approved or cleared by the [place] FDA and has been authorized for detection and/or diagnosis of SARS-CoV-2 by FDA under an Emergency Use Authorization (EUA).  This EUA will remain in effect (meaning this test can be used) for the duration of the COVID-19 declaration under Section 564(b)(1) of the Act,  21 U.S.C. section 360bbb-3(b)(1), unless the authorization is terminated or revoked sooner. Performed at [facility], [address] [place], [place] [address]     Imaging: No results found.

## 2018-12-15 NOTE — Progress Notes (Signed)
Foley discontinued ove an hour ago. Patient voide spontaneously. Wii discharge home.

## 2018-12-15 NOTE — Progress Notes (Signed)
Patient ID: Tamsen Reist, female   DOB: 08/12/1950, 68 y.o.   MRN: [id number] S: Feels better O:BP 135/60   Pulse 70   Temp 99 F (37.2 C)   Resp 19   SpO2 95%   Intake/Output Summary (Last 24 hours) at 12/15/2018 1144 Last data filed at 12/15/2018 0616 Gross per 24 hour  Intake 2100.77 ml  Output 1775 ml  Net 325.77 ml   Intake/Output: I/O last 3 completed shifts: In: 3766.7 [P.O.:1260; I.V.:2345.8; IV Piggyback:160.9] Out: 3470 [Urine:3470]  Intake/Output this shift:  No intake/output data recorded. Weight change:  Gen: NAD CVS: no rub Resp: cta Abd: +BS, soft, mildly tender to palpation no guarding/rebound Ext: no edema  Recent Labs  Lab 12/12/18 1942 12/13/18 0422 12/13/18 1339 12/14/18 0637 12/15/18 0346  NA 134* 135 139 141 141  K 6.2* 5.1 5.9* 5.1 4.3  CL 96* 97* 103 108 111  CO2 14* 13* 14* 20* 20*  GLUCOSE 133* 95 136* 83 87  BUN 87* 90* 79* 60* 37*  CREATININE 11.15* 11.50* 9.54* 5.94* 2.76*  ALBUMIN 3.5  --  3.0* 2.9* 2.6*  CALCIUM 8.9 8.5* 9.0 8.6* 8.3*  PHOS  --   --   --   --  2.7  AST 20  --  29 27  --   ALT 13  --  12 13  --    Liver Function Tests: Recent Labs  Lab 12/12/18 1942 12/13/18 1339 12/14/18 0637 12/15/18 0346  AST 20 29 27   --   ALT 13 12 13   --   ALKPHOS 74 70 53  --   BILITOT 4.3* 3.0* 1.4*  --   PROT 7.7 6.6 6.0*  --   ALBUMIN 3.5 3.0* 2.9* 2.6*   Recent Labs  Lab 12/12/18 1942  LIPASE 26   No results for input(s): AMMONIA in the last 168 hours. CBC: Recent Labs  Lab 12/12/18 1942 12/13/18 1339 12/14/18 0637 12/15/18 0346  WBC 8.6 7.8 8.6 7.6  NEUTROABS  --   --  6.6  --   HGB 11.7* 10.5* 9.8* 9.4*  HCT 36.5 32.9* 30.8* 30.0*  MCV 90.1 90.6 90.9 90.9  PLT 311 336 378 354   Cardiac Enzymes: Recent Labs  Lab 12/13/18 0422 12/13/18 0529  CKTOTAL 198 259*   CBG: Recent Labs  Lab 12/13/18 0302 12/13/18 0413 12/13/18 0456 12/13/18 0924  GLUCAP 218* 84 66* 104*    Iron Studies: No results for  input(s): IRON, TIBC, TRANSFERRIN, FERRITIN in the last 72 hours. Studies/Results: No results found. . Chlorhexidine Gluconate Cloth  6 each Topical Daily  . cholecalciferol  1,000 Units Oral Daily  . fluticasone  1 spray Each Nare BID  . hydrALAZINE  25 mg Oral Q8H  . multivitamin with minerals  1 tablet Oral Daily  . sodium bicarbonate  50 mEq Intravenous Once  . sodium chloride flush  3 mL Intravenous Once  . sodium zirconium cyclosilicate  10 g Oral Once    BMET    Component Value Date/Time   NA 141 12/15/2018 0346   NA 141 11/28/2018 1517   K 4.3 12/15/2018 0346   CL 111 12/15/2018 0346   CO2 20 (L) 12/15/2018 0346   GLUCOSE 87 12/15/2018 0346   BUN 37 (H) 12/15/2018 0346   BUN 17 11/28/2018 1517   CREATININE 2.76 (H) 12/15/2018 0346   CALCIUM 8.3 (L) 12/15/2018 0346   GFRNONAA 17 (L) 12/15/2018 0346   GFRAA 20 (L) 12/15/2018 12/17/2018  CBC    Component Value Date/Time   WBC 7.6 12/15/2018 0346   RBC 3.30 (L) 12/15/2018 0346   HGB 9.4 (L) 12/15/2018 0346   HCT 30.0 (L) 12/15/2018 0346   PLT 354 12/15/2018 0346   MCV 90.9 12/15/2018 0346   MCH 28.5 12/15/2018 0346   MCHC 31.3 12/15/2018 0346   RDW 14.8 12/15/2018 0346   LYMPHSABS 1.2 12/14/2018 0637   MONOABS 0.7 12/14/2018 0637   EOSABS 0.0 12/14/2018 0637   BASOSABS 0.0 12/14/2018 0637    Assessment/Plan:  1. 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   1. Improved UOP and Scr since she underwent cystoscopy and double-j stent placement 2. Cr down to 2.76.  Nothing further to add.  Will sign off, please call with questions/concerns.  She can f/u with our office in 4 weeks after discharge if her Scr does not return to baseline of 1.1-1.2 2. Obstructing right UPJ stone s/p double - j stent placement.  Urology following 3. Pyelonephtiris: E coli and E. Faecalis from urine.  On cefepime 4. HTN- stable 5. Anemia - presumably due to acute illness 6. CBD dilation with  abnormal LFT's and abdominal pain.  Surgery and GI following 7. Hyperkalemia- markedly improved after obstruction was relieved. 8. 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

## 2018-12-16 LAB — RENAL FUNCTION PANEL
Albumin: 2.6 g/dL — ABNORMAL LOW (ref 3.5–5.0)
Anion gap: 10 (ref 5–15)
BUN: 37 mg/dL — ABNORMAL HIGH (ref 8–23)
CO2: 20 mmol/L — ABNORMAL LOW (ref 22–32)
Calcium: 8.3 mg/dL — ABNORMAL LOW (ref 8.9–10.3)
Chloride: 111 mmol/L (ref 98–111)
Creatinine, Ser: 2.76 mg/dL — ABNORMAL HIGH (ref 0.44–1.00)
GFR calc Af Amer: 20 mL/min — ABNORMAL LOW (ref >60)
GFR calc non Af Amer: 17 mL/min — ABNORMAL LOW (ref >60)
Glucose, Bld: 87 mg/dL (ref 70–99)
Phosphorus: 2.7 mg/dL (ref 2.5–4.6)
Potassium: 4.3 mmol/L (ref 3.5–5.1)
Sodium: 141 mmol/L (ref 135–145)

## 2018-12-17 LAB — BPAM RBC
Blood Product Expiration Date: 202011062359
Blood Product Expiration Date: 202011082359
Unit Type and Rh: 600
Unit Type and Rh: 600

## 2018-12-17 LAB — TYPE AND SCREEN
ABO/RH(D): A NEG
Antibody Screen: POSITIVE
Donor AG Type: NEGATIVE
Donor AG Type: NEGATIVE
PT AG Type: NEGATIVE
Unit division: 0
Unit division: 0

## 2018-12-24 DIAGNOSIS — N200 Calculus of kidney: Secondary | ICD-10-CM | POA: Diagnosis not present

## 2018-12-24 DIAGNOSIS — N3090 Cystitis, unspecified without hematuria: Secondary | ICD-10-CM | POA: Diagnosis not present

## 2018-12-24 DIAGNOSIS — D649 Anemia, unspecified: Secondary | ICD-10-CM | POA: Diagnosis not present

## 2018-12-24 DIAGNOSIS — R300 Dysuria: Secondary | ICD-10-CM | POA: Diagnosis not present

## 2018-12-24 DIAGNOSIS — N179 Acute kidney failure, unspecified: Secondary | ICD-10-CM | POA: Diagnosis not present

## 2018-12-28 ENCOUNTER — Other Ambulatory Visit: Payer: Self-pay

## 2018-12-28 DIAGNOSIS — Z20822 Contact with and (suspected) exposure to covid-19: Secondary | ICD-10-CM

## 2018-12-29 LAB — NOVEL CORONAVIRUS, NAA: SARS-CoV-2, NAA: NOT DETECTED

## 2019-01-04 DIAGNOSIS — N261 Atrophy of kidney (terminal): Secondary | ICD-10-CM | POA: Diagnosis not present

## 2019-01-04 DIAGNOSIS — N201 Calculus of ureter: Secondary | ICD-10-CM | POA: Diagnosis not present

## 2019-01-08 ENCOUNTER — Other Ambulatory Visit: Payer: Self-pay | Admitting: Urology

## 2019-01-08 ENCOUNTER — Encounter (HOSPITAL_BASED_OUTPATIENT_CLINIC_OR_DEPARTMENT_OTHER): Payer: Self-pay | Admitting: *Deleted

## 2019-01-09 ENCOUNTER — Other Ambulatory Visit (HOSPITAL_COMMUNITY)
Admission: RE | Admit: 2019-01-09 | Discharge: 2019-01-09 | Disposition: A | Discharge status: Home / Self Care | Payer: Medicare Other | Source: Ambulatory Visit | Attending: Urology | Admitting: Urology

## 2019-01-09 ENCOUNTER — Encounter (HOSPITAL_BASED_OUTPATIENT_CLINIC_OR_DEPARTMENT_OTHER): Payer: Self-pay | Admitting: *Deleted

## 2019-01-09 ENCOUNTER — Other Ambulatory Visit: Payer: Self-pay

## 2019-01-09 DIAGNOSIS — Z20828 Contact with and (suspected) exposure to other viral communicable diseases: Secondary | ICD-10-CM | POA: Insufficient documentation

## 2019-01-09 DIAGNOSIS — Z01812 Encounter for preprocedural laboratory examination: Secondary | ICD-10-CM | POA: Insufficient documentation

## 2019-01-09 LAB — SARS CORONAVIRUS 2 (TAT 6-24 HRS): SARS Coronavirus 2: NEGATIVE

## 2019-01-09 NOTE — Progress Notes (Signed)
Spoke w/ via phone for pre-op interview---Mary [name] needs dos----   I stat 8           [name] results------ekg 12-13-18 chart/epic COVID test ------01-09-2019 Arrive at -------997 am 01-10-2019 NPO after ------midnight Medications to take morning of surgery -----oxycodone prn, ms contin, flonase, methocarbamol, hydrazaline Diabetic medication -----n/a Patient Special Instructions ----- Pre-Op special Istructions ----- Patient verbalized understanding of instructions that were given at this phone interview. Patient denies shortness of breath, chest pain, fever, cough a this phone interview.

## 2019-01-09 NOTE — Anesthesia Preprocedure Evaluation (Addendum)
Anesthesia Evaluation  Patient identified by MRN, date of birth, ID band Patient awake    Reviewed: Allergy & Precautions, NPO status , Patient's Chart, lab work & pertinent test results  History of Anesthesia Complications Negative for: history of anesthetic complications  Airway Mallampati: III  TM Distance: >3 FB Neck ROM: Full    Dental  (+) Dental Advisory Given   Pulmonary asthma , former smoker,    Pulmonary exam normal        Cardiovascular hypertension, Pt. on medications Normal cardiovascular exam     Neuro/Psych  Headaches, PSYCHIATRIC DISORDERS Anxiety    GI/Hepatic GERD  Controlled and Medicated,  Endo/Other  negative endocrine ROS Denies DM   Renal/GU Renal InsufficiencyRenal disease     Musculoskeletal  (+) Arthritis , narcotic dependent  Abdominal   Peds  Hematology negative hematology ROS (+)   Anesthesia Other Findings Sjogren's syndrome  Reproductive/Obstetrics                            Anesthesia Physical Anesthesia Plan  ASA: II  Anesthesia Plan: General   Post-op Pain Management:    Induction: Intravenous  PONV Risk Score and Plan: 4 or greater and Treatment may vary due to age or medical condition, Ondansetron, Midazolam and Dexamethasone  Airway Management Planned: LMA  Additional Equipment: None  Intra-op Plan:   Post-operative Plan: Extubation in OR  Informed Consent: I have reviewed the patients History and Physical, chart, labs and discussed the procedure including the risks, benefits and alternatives for the proposed anesthesia with the patient or authorized representative who has indicated his/her understanding and acceptance.     Dental advisory given  Plan Discussed with: CRNA and Anesthesiologist  Anesthesia Plan Comments:        Anesthesia Quick Evaluation

## 2019-01-10 ENCOUNTER — Other Ambulatory Visit: Payer: Self-pay

## 2019-01-10 ENCOUNTER — Encounter (HOSPITAL_BASED_OUTPATIENT_CLINIC_OR_DEPARTMENT_OTHER): Payer: Self-pay | Admitting: Emergency Medicine

## 2019-01-10 ENCOUNTER — Ambulatory Visit (HOSPITAL_BASED_OUTPATIENT_CLINIC_OR_DEPARTMENT_OTHER): Payer: Medicare Other | Admitting: Anesthesiology

## 2019-01-10 ENCOUNTER — Encounter (HOSPITAL_BASED_OUTPATIENT_CLINIC_OR_DEPARTMENT_OTHER)
Admission: RE | Disposition: A | Discharge status: Home / Self Care | Payer: Self-pay | Source: Home / Self Care | Attending: Urology

## 2019-01-10 ENCOUNTER — Ambulatory Visit (HOSPITAL_BASED_OUTPATIENT_CLINIC_OR_DEPARTMENT_OTHER)
Admission: RE | Admit: 2019-01-10 | Discharge: 2019-01-10 | Disposition: A | Discharge status: Home / Self Care | Payer: Medicare Other | Attending: Urology | Admitting: Urology

## 2019-01-10 DIAGNOSIS — Z79899 Other long term (current) drug therapy: Secondary | ICD-10-CM | POA: Insufficient documentation

## 2019-01-10 DIAGNOSIS — M542 Cervicalgia: Secondary | ICD-10-CM | POA: Diagnosis not present

## 2019-01-10 DIAGNOSIS — Z87891 Personal history of nicotine dependence: Secondary | ICD-10-CM | POA: Insufficient documentation

## 2019-01-10 DIAGNOSIS — M545 Low back pain: Secondary | ICD-10-CM | POA: Insufficient documentation

## 2019-01-10 DIAGNOSIS — F1120 Opioid dependence, uncomplicated: Secondary | ICD-10-CM | POA: Insufficient documentation

## 2019-01-10 DIAGNOSIS — I129 Hypertensive chronic kidney disease with stage 1 through stage 4 chronic kidney disease, or unspecified chronic kidney disease: Secondary | ICD-10-CM | POA: Diagnosis not present

## 2019-01-10 DIAGNOSIS — E7800 Pure hypercholesterolemia, unspecified: Secondary | ICD-10-CM | POA: Diagnosis not present

## 2019-01-10 DIAGNOSIS — M1990 Unspecified osteoarthritis, unspecified site: Secondary | ICD-10-CM | POA: Diagnosis not present

## 2019-01-10 DIAGNOSIS — Z880 Allergy status to penicillin: Secondary | ICD-10-CM | POA: Insufficient documentation

## 2019-01-10 DIAGNOSIS — Z881 Allergy status to other antibiotic agents status: Secondary | ICD-10-CM | POA: Insufficient documentation

## 2019-01-10 DIAGNOSIS — M8580 Other specified disorders of bone density and structure, unspecified site: Secondary | ICD-10-CM | POA: Diagnosis not present

## 2019-01-10 DIAGNOSIS — N132 Hydronephrosis with renal and ureteral calculous obstruction: Secondary | ICD-10-CM | POA: Diagnosis not present

## 2019-01-10 DIAGNOSIS — M3500 Sicca syndrome, unspecified: Secondary | ICD-10-CM | POA: Diagnosis not present

## 2019-01-10 DIAGNOSIS — Z882 Allergy status to sulfonamides status: Secondary | ICD-10-CM | POA: Diagnosis not present

## 2019-01-10 DIAGNOSIS — J45909 Unspecified asthma, uncomplicated: Secondary | ICD-10-CM | POA: Diagnosis not present

## 2019-01-10 DIAGNOSIS — N200 Calculus of kidney: Secondary | ICD-10-CM | POA: Diagnosis not present

## 2019-01-10 DIAGNOSIS — Z886 Allergy status to analgesic agent status: Secondary | ICD-10-CM | POA: Diagnosis not present

## 2019-01-10 DIAGNOSIS — G894 Chronic pain syndrome: Secondary | ICD-10-CM | POA: Diagnosis not present

## 2019-01-10 DIAGNOSIS — N183 Chronic kidney disease, stage 3 unspecified: Secondary | ICD-10-CM | POA: Diagnosis not present

## 2019-01-10 DIAGNOSIS — K219 Gastro-esophageal reflux disease without esophagitis: Secondary | ICD-10-CM | POA: Insufficient documentation

## 2019-01-10 DIAGNOSIS — Z888 Allergy status to other drugs, medicaments and biological substances status: Secondary | ICD-10-CM | POA: Insufficient documentation

## 2019-01-10 DIAGNOSIS — F419 Anxiety disorder, unspecified: Secondary | ICD-10-CM | POA: Diagnosis not present

## 2019-01-10 DIAGNOSIS — N189 Chronic kidney disease, unspecified: Secondary | ICD-10-CM | POA: Diagnosis not present

## 2019-01-10 HISTORY — DX: Other complications of anesthesia, initial encounter: T88.59XA

## 2019-01-10 HISTORY — PX: CYSTOSCOPY WITH RETROGRADE PYELOGRAM, URETEROSCOPY AND STENT PLACEMENT: SHX5789

## 2019-01-10 HISTORY — DX: Other chronic pain: G89.29

## 2019-01-10 HISTORY — PX: HOLMIUM LASER APPLICATION: SHX5852

## 2019-01-10 HISTORY — DX: Disease of appendix, unspecified: K38.9

## 2019-01-10 HISTORY — DX: Personal history of urinary calculi: Z87.442

## 2019-01-10 HISTORY — DX: Calculus of gallbladder without cholecystitis without obstruction: K80.20

## 2019-01-10 LAB — POCT I-STAT, CHEM 8
BUN: 18 mg/dL (ref 8–23)
Calcium, Ion: 1.28 mmol/L (ref 1.15–1.40)
Chloride: 102 mmol/L (ref 98–111)
Creatinine, Ser: 1.1 mg/dL — ABNORMAL HIGH (ref 0.44–1.00)
Glucose, Bld: 102 mg/dL — ABNORMAL HIGH (ref 70–99)
HCT: 34 % — ABNORMAL LOW (ref 36.0–46.0)
Hemoglobin: 11.6 g/dL — ABNORMAL LOW (ref 12.0–15.0)
Potassium: 3.7 mmol/L (ref 3.5–5.1)
Sodium: 142 mmol/L (ref 135–145)
TCO2: 27 mmol/L (ref 22–32)

## 2019-01-10 SURGERY — CYSTOURETEROSCOPY, WITH RETROGRADE PYELOGRAM AND STENT INSERTION
Anesthesia: General | Site: Ureter | Laterality: Right

## 2019-01-10 MED ORDER — ATROPINE SULFATE 1 MG/10ML IJ SOSY
PREFILLED_SYRINGE | INTRAMUSCULAR | Status: AC
  Filled 2019-01-10: qty 10

## 2019-01-10 MED ORDER — PROPOFOL 10 MG/ML IV BOLUS
INTRAVENOUS | Status: AC
  Filled 2019-01-10: qty 20

## 2019-01-10 MED ORDER — ATROPINE SULFATE 0.4 MG/ML IJ SOLN
INTRAMUSCULAR | Status: DC | PRN
  Administered 2019-01-10: 0.2 mg via INTRAVENOUS

## 2019-01-10 MED ORDER — MIDAZOLAM HCL 2 MG/2ML IJ SOLN
INTRAMUSCULAR | Status: AC
  Filled 2019-01-10: qty 2

## 2019-01-10 MED ORDER — FENTANYL CITRATE (PF) 100 MCG/2ML IJ SOLN
INTRAMUSCULAR | Status: DC | PRN
  Administered 2019-01-10: 25 ug via INTRAVENOUS
  Administered 2019-01-10: 50 ug via INTRAVENOUS
  Administered 2019-01-10: 25 ug via INTRAVENOUS

## 2019-01-10 MED ORDER — CIPROFLOXACIN IN D5W 400 MG/200ML IV SOLN
INTRAVENOUS | Status: AC
  Filled 2019-01-10: qty 200

## 2019-01-10 MED ORDER — LIDOCAINE 2% (20 MG/ML) 5 ML SYRINGE
INTRAMUSCULAR | Status: AC
  Filled 2019-01-10: qty 5

## 2019-01-10 MED ORDER — GLYCOPYRROLATE 0.2 MG/ML IJ SOLN
INTRAMUSCULAR | Status: DC | PRN
  Administered 2019-01-10: 0.2 mg via INTRAVENOUS

## 2019-01-10 MED ORDER — IOHEXOL 300 MG/ML  SOLN
INTRAMUSCULAR | Status: DC | PRN
  Administered 2019-01-10: 10 mL

## 2019-01-10 MED ORDER — ESMOLOL HCL 100 MG/10ML IV SOLN
INTRAVENOUS | Status: DC | PRN
  Administered 2019-01-10 (×2): 20 mg via INTRAVENOUS
  Administered 2019-01-10: 10 mg via INTRAVENOUS

## 2019-01-10 MED ORDER — FENTANYL CITRATE (PF) 100 MCG/2ML IJ SOLN
25.0000 ug | INTRAMUSCULAR | Status: DC | PRN
  Administered 2019-01-10: 50 ug via INTRAVENOUS
  Administered 2019-01-10 (×2): 25 ug via INTRAVENOUS
  Filled 2019-01-10: qty 1

## 2019-01-10 MED ORDER — SODIUM CHLORIDE 0.9 % IR SOLN
Status: DC | PRN
  Administered 2019-01-10: 3000 mL

## 2019-01-10 MED ORDER — FENTANYL CITRATE (PF) 100 MCG/2ML IJ SOLN
INTRAMUSCULAR | Status: AC
  Filled 2019-01-10: qty 2

## 2019-01-10 MED ORDER — MIDAZOLAM HCL 5 MG/5ML IJ SOLN
INTRAMUSCULAR | Status: DC | PRN
  Administered 2019-01-10 (×2): 1 mg via INTRAVENOUS

## 2019-01-10 MED ORDER — ONDANSETRON HCL 4 MG/2ML IJ SOLN
4.0000 mg | Freq: Once | INTRAMUSCULAR | Status: DC | PRN
  Filled 2019-01-10: qty 2

## 2019-01-10 MED ORDER — GLYCOPYRROLATE PF 0.2 MG/ML IJ SOSY
PREFILLED_SYRINGE | INTRAMUSCULAR | Status: AC
  Filled 2019-01-10: qty 1

## 2019-01-10 MED ORDER — CIPROFLOXACIN IN D5W 400 MG/200ML IV SOLN
INTRAVENOUS | Status: DC | PRN
  Administered 2019-01-10: 400 mg via INTRAVENOUS

## 2019-01-10 MED ORDER — ONDANSETRON HCL 4 MG/2ML IJ SOLN
INTRAMUSCULAR | Status: AC
  Filled 2019-01-10: qty 2

## 2019-01-10 MED ORDER — ONDANSETRON HCL 4 MG/2ML IJ SOLN
INTRAMUSCULAR | Status: DC | PRN
  Administered 2019-01-10: 4 mg via INTRAVENOUS

## 2019-01-10 MED ORDER — OXYCODONE HCL 5 MG/5ML PO SOLN
5.0000 mg | Freq: Once | ORAL | Status: AC | PRN
  Filled 2019-01-10: qty 5

## 2019-01-10 MED ORDER — LIDOCAINE HCL 1 % IJ SOLN
INTRAMUSCULAR | Status: DC | PRN
  Administered 2019-01-10: 40 mg via INTRADERMAL

## 2019-01-10 MED ORDER — OXYCODONE HCL 5 MG PO TABS
5.0000 mg | ORAL_TABLET | Freq: Once | ORAL | Status: AC | PRN
  Administered 2019-01-10: 5 mg via ORAL
  Filled 2019-01-10: qty 1

## 2019-01-10 MED ORDER — SODIUM CHLORIDE 0.9 % IV SOLN
INTRAVENOUS | Status: DC
  Administered 2019-01-10 (×3): via INTRAVENOUS
  Filled 2019-01-10: qty 1000

## 2019-01-10 MED ORDER — CIPROFLOXACIN HCL 250 MG PO TABS: 250.0000 mg | ORAL_TABLET | Freq: Two times a day (BID) | ORAL | 0 refills | Status: DC

## 2019-01-10 MED ORDER — DEXAMETHASONE SODIUM PHOSPHATE 10 MG/ML IJ SOLN
INTRAMUSCULAR | Status: AC
  Filled 2019-01-10: qty 1

## 2019-01-10 MED ORDER — PROPOFOL 10 MG/ML IV BOLUS
INTRAVENOUS | Status: DC | PRN
  Administered 2019-01-10: 160 mg via INTRAVENOUS

## 2019-01-10 MED ORDER — DEXAMETHASONE SODIUM PHOSPHATE 4 MG/ML IJ SOLN
INTRAMUSCULAR | Status: DC | PRN
  Administered 2019-01-10: 4 mg via INTRAVENOUS

## 2019-01-10 MED ORDER — OXYCODONE HCL 5 MG PO TABS
ORAL_TABLET | ORAL | Status: AC
  Filled 2019-01-10: qty 1

## 2019-01-10 SURGICAL SUPPLY — 28 items
BAG DRAIN URO-CYSTO SKYTR STRL (DRAIN) ×2 IMPLANT
BASKET LASER NITINOL 1.9FR (BASKET) ×2 IMPLANT
BASKET ZERO TIP NITINOL 2.4FR (BASKET) IMPLANT
CATH INTERMIT  6FR 70CM (CATHETERS) ×2 IMPLANT
CLOTH BEACON ORANGE TIMEOUT ST (SAFETY) ×2 IMPLANT
ELECT REM PT RETURN 9FT ADLT (ELECTROSURGICAL)
ELECTRODE REM PT RTRN 9FT ADLT (ELECTROSURGICAL) IMPLANT
FIBER LASER FLEXIVA 200 (UROLOGICAL SUPPLIES) ×2 IMPLANT
FIBER LASER FLEXIVA 365 (UROLOGICAL SUPPLIES) IMPLANT
FIBER LASER TRAC TIP (UROLOGICAL SUPPLIES) ×2 IMPLANT
GLOVE BIO SURGEON STRL SZ 6.5 (GLOVE) ×2 IMPLANT
GLOVE BIO SURGEON STRL SZ8 (GLOVE) ×2 IMPLANT
GLOVE BIOGEL PI IND STRL 7.0 (GLOVE) ×1 IMPLANT
GLOVE BIOGEL PI INDICATOR 7.0 (GLOVE) ×1
GOWN STRL REUS W/ TWL XL LVL3 (GOWN DISPOSABLE) ×1 IMPLANT
GOWN STRL REUS W/TWL LRG LVL3 (GOWN DISPOSABLE) ×2 IMPLANT
GOWN STRL REUS W/TWL XL LVL3 (GOWN DISPOSABLE) ×1
GUIDEWIRE ANG ZIPWIRE 038X150 (WIRE) IMPLANT
GUIDEWIRE STR DUAL SENSOR (WIRE) IMPLANT
IV NS IRRIG 3000ML ARTHROMATIC (IV SOLUTION) ×4 IMPLANT
KIT TURNOVER CYSTO (KITS) ×2 IMPLANT
MANIFOLD NEPTUNE II (INSTRUMENTS) ×2 IMPLANT
NS IRRIG 500ML POUR BTL (IV SOLUTION) IMPLANT
PACK CYSTO (CUSTOM PROCEDURE TRAY) ×2 IMPLANT
SHEATH ACCESS URETERAL 38CM (SHEATH) ×2 IMPLANT
STENT URET 6FRX24 CONTOUR (STENTS) ×2 IMPLANT
TUBE CONNECTING 12X1/4 (SUCTIONS) ×2 IMPLANT
TUBING UROLOGY SET (TUBING) ×2 IMPLANT

## 2019-01-10 NOTE — Transfer of Care (Signed)
Immediate Anesthesia Transfer of Care Note  Patient: Mary [name]  Procedure(s) Performed: CYSTOSCOPY WITH RETROGRADE PYELOGRAM, URETEROSCOPY AND STENT PLACEMENT (Right Ureter) HOLMIUM LASER APPLICATION (Right )  Patient Location: PACU  Anesthesia Type:General  Level of Consciousness: awake, alert , oriented and patient cooperative  Airway & Oxygen Therapy: Patient Spontanous Breathing and Patient connected to nasal cannula oxygen  Post-op Assessment: Report given to RN and Post -op Vital signs reviewed and stable  Post vital signs: Reviewed and stable  Last Vitals:  Vitals Value Taken Time  BP 182/78 01/10/19 1120  Temp 36.4 C 01/10/19 1120  Pulse 64 01/10/19 1123  Resp 13 01/10/19 1123  SpO2 100 % 01/10/19 1123  Vitals shown include unvalidated device data.  Last Pain:  Vitals:   01/10/19 0728  TempSrc: Oral  PainSc: 2       Patients Stated Pain Goal: 6 (18/84/16 6063)  Complications: No apparent anesthesia complications

## 2019-01-10 NOTE — Anesthesia Procedure Notes (Signed)
Procedure Name: LMA Insertion Date/Time: 01/10/2019 9:54 AM Performed by: Garrel Ridgel, CRNA Pre-anesthesia Checklist: Patient identified, Emergency Drugs available, Suction available, Patient being monitored and Timeout performed Patient Re-evaluated:Patient Re-evaluated prior to induction Oxygen Delivery Method: Circle system utilized Preoxygenation: Pre-oxygenation with 100% oxygen Induction Type: IV induction Ventilation: Mask ventilation without difficulty LMA: LMA with gastric port inserted LMA Size: 4.0 Number of attempts: 1 Placement Confirmation: positive ETCO2 Tube secured with: Tape Dental Injury: Teeth and Oropharynx as per pre-operative assessment

## 2019-01-10 NOTE — Interval H&P Note (Signed)
History and Physical Interval Note:  01/10/2019 8:56 AM  Mary [name]  has presented today for surgery, with the diagnosis of RIGHT RENAL AMD URETERAL CALCULI.  The various methods of treatment have been discussed with the patient and family. After consideration of risks, benefits and other options for treatment, the patient has consented to  Procedure(s) with comments: CYSTOSCOPY WITH RETROGRADE PYELOGRAM, URETEROSCOPY AND STENT PLACEMENT (Right) - 66 MINS HOLMIUM LASER APPLICATION (Right) as a surgical intervention.  The patient's history has been reviewed, patient examined, no change in status, stable for surgery.  I have reviewed the patient's chart and labs.  Questions were answered to the patient's satisfaction.     Lillette Boxer Leonell Lobdell

## 2019-01-10 NOTE — Discharge Instructions (Signed)
1. You may see some blood in the urine and may have some burning with urination for 48-72 hours. You also may notice that you have to urinate more frequently or urgently after your procedure which is normal.  °2. You should call should you develop an inability urinate, fever > 101, persistent nausea and vomiting that prevents you from eating or drinking to stay hydrated.  °3. If you have a stent, you will likely urinate more frequently and urgently until the stent is removed and you may experience some discomfort/pain in the lower abdomen and flank especially when urinating. You may take pain medication prescribed to you if needed for pain. You may also intermittently have blood in the urine until the stent is removed. °4. If you have a catheter, you will be taught how to take care of the catheter by the nursing staff prior to discharge from the hospital.  You may periodically feel a strong urge to void with the catheter in place.  This is a bladder spasm and most often can occur when having a bowel movement or moving around. It is typically self-limited and usually will stop after a few minutes.  You may use some Vaseline or Neosporin around the tip of the catheter to reduce friction at the tip of the penis. You may also see some blood in the urine.  A very small amount of blood can make the urine look quite red.  As long as the catheter is draining well, there usually is not a problem.  However, if the catheter is not draining well and is bloody, you should call the office ([phone]) to notify us. ° ° °[facility] °[phone] °Post Ureteroscopy With or Without Stent Instructions ° °Definitions: ° °Ureter: The duct that transports urine from the kidney to the bladder. °Stent:   A plastic hollow tube that is placed into the ureter, from the kidney to the                 bladder to prevent the ureter from swelling shut. ° °GENERAL INSTRUCTIONS: ° °Despite the fact that no skin incisions were  used, the area around the ureter and bladder is raw and irritated. The stent is a foreign body which will further irritate the bladder wall. This irritation is manifested by increased frequency of urination, both day and night, and by an increase in the urge to urinate. In some, the urge to urinate is present almost always. Sometimes the urge is strong enough that you may not be able to stop yourself from urinating. The only real cure is to remove the stent and then give time for the bladder wall to heal which can't be done until the danger of the ureter swelling shut has passed, which varies. ° °You may see some blood in your urine while the stent is in place and a few days afterwards. Do not be alarmed, even if the urine was clear for a while. Get off your feet and drink lots of fluids until clearing occurs. If you start to pass clots or don't improve, call us. ° °DIET: °You may return to your normal diet immediately. Because of the raw surface of your bladder, alcohol, spicy foods, acid type foods and drinks with caffeine may cause irritation or frequency and should be used in moderation. To keep your urine flowing freely and to avoid constipation, drink plenty of fluids during the day ( 8-10 glasses ). °Tip: Avoid cranberry juice because it is very acidic. ° °  ACTIVITY: °Your physical activity doesn't need to be restricted. However, if you are very active, you may see some blood in your urine. We suggest that you reduce your activity under these circumstances until the bleeding has stopped. ° °BOWELS: °It is important to keep your bowels regular during the postoperative period. Straining with bowel movements can cause bleeding. A bowel movement every other day is reasonable. Use a mild laxative if needed, such as Milk of Magnesia 2-3 tablespoons, or 2 Dulcolax tablets. Call if you continue to have problems. If you have been taking narcotics for pain, before, during or after your surgery, you may be constipated.  Take a laxative if necessary. ° ° °MEDICATION: °You should resume your pre-surgery medications unless told not to. In addition you will often be given an antibiotic to prevent infection. These should be taken as prescribed until the bottles are finished unless you are having an unusual reaction to one of the drugs. ° °PROBLEMS YOU SHOULD REPORT TO US: °· Fevers over 100.5 Fahrenheit. °· Heavy bleeding, or clots ( See above notes about blood in urine ). °· Inability to urinate. °· Drug reactions ( hives, rash, nausea, vomiting, diarrhea ). °· Severe burning or pain with urination that is not improving. ° °FOLLOW-UP: °You will need a follow-up appointment to monitor your progress. Call for this appointment at the number listed above. Usually the first appointment will be about three to fourteen days after your surgery. ° ° ° ° ° ° °Post Anesthesia Home Care Instructions ° °Activity: °Get plenty of rest for the remainder of the day. A responsible adult should stay with you for 24 hours following the procedure.  °For the next 24 hours, DO NOT: °-Drive a car °-Operate machinery °-Drink alcoholic beverages °-Take any medication unless instructed by your physician °-Make any legal decisions or sign important papers. ° °Meals: °Start with liquid foods such as gelatin or soup. Progress to regular foods as tolerated. Avoid greasy, spicy, heavy foods. If nausea and/or vomiting occur, drink only clear liquids until the nausea and/or vomiting subsides. Call your physician if vomiting continues. ° °Special Instructions/Symptoms: °Your throat may feel dry or sore from the anesthesia or the breathing tube placed in your throat during surgery. If this causes discomfort, gargle with warm salt water. The discomfort should disappear within 24 hours. ° °If you had a scopolamine patch placed behind your ear for the management of post- operative nausea and/or vomiting: ° °1. The medication in the patch is effective for 72 hours, after  which it should be removed.  Wrap patch in a tissue and discard in the trash. Wash hands thoroughly with soap and water. °2. You may remove the patch earlier than 72 hours if you experience unpleasant side effects which may include dry mouth, dizziness or visual disturbances. °3. Avoid touching the patch. Wash your hands with soap and water after contact with the patch. °  ° ° °

## 2019-01-10 NOTE — H&P (Signed)
H&P  Chief Complaint: Right ureteral stone  History of Present Illness: Mary [name] is a 68 y.o. year old female  Who presents for ureteroscopic management of a previously stented 8 x 11 mm right upper ureteral stone.  She presented approximately 1 month ago to medical attention with acute renal failure with a creatinine of 11.  She does have a prior history of urolithiasis.  CT revealed the previously mentioned right upper ureteral stone with significant  Hydronephrosis.  She underwent urgent stenting.  She presents at this time, with her acute renal failure resolved, for ureteroscopic management.  Past Medical History:  Diagnosis Date  . Allergy   . Appendix disease    has stump since removal  . Arthritis   . Asthma yrs ago  . Chronic kidney disease    arf 12-23-2018 hospital admit   . Chronic pain    lower back and cervical  . Complication of anesthesia    low temperature 1998 with hysterectomy, no problems since, decreased body temperature  . Diabetes mellitus   . Gallstones   . GERD (gastroesophageal reflux disease)   . Headache(784.0)   . High cholesterol   . History of kidney stones   . Hypertension   . Migraines   . Sjogrens syndrome (HCC)   . Thyroid disease    3 cysts in pasy went away    Past Surgical History:  Procedure Laterality Date  . ABDOMINAL HYSTERECTOMY  1998   complete  . ANTERIOR CERVICAL DECOMP/DISCECTOMY FUSION  2011  . APPENDECTOMY    . BREAST BIOPSY  1971  . CYSTOSCOPY W/ URETERAL STENT PLACEMENT Right 12/13/2018   Procedure: CYSTOSCOPY WITH URETERAL STENT PLACEMENT;  Surgeon: Marcine Matar, MD;  Location: [facility] OR;  Service: Urology;  Laterality: Right;  . KIDNEY STONE SURGERY  2011  . LUMBAR FUSION  2005  . SHOULDER SURGERY Right 2020   bone spurs removed and tendon repair  . TONSILLECTOMY  1971    Home Medications:  Medications Prior to Admission  Medication Sig Dispense Refill  . butalbital-acetaminophen-caffeine (FIORICET, ESGIC)  50-325-40 MG tablet Take 1 tablet by mouth every 6 (six) hours as needed for headache. 90 tablet 1  . cholecalciferol (VITAMIN D3) 25 MCG (1000 UT) tablet Take 1,000 Units by mouth daily.    . ferrous sulfate 325 (65 FE) MG EC tablet Take 325 mg by mouth 3 (three) times daily with meals.    . fluticasone (FLONASE) 50 MCG/ACT nasal spray Place 1 spray into both nostrils 2 (two) times daily.    . furosemide (LASIX) 20 MG tablet Take 1 tablet (20 mg total) by mouth daily. 180 tablet 0  . hydrALAZINE (APRESOLINE) 25 MG tablet Take 1 tablet (25 mg total) by mouth every 8 (eight) hours. 90 tablet 0  . Melatonin 3 MG TABS Take 3 mg by mouth at bedtime as needed (for sleep).     . methocarbamol (ROBAXIN) 500 MG tablet Take 1,000 mg by mouth 4 (four) times daily.     Marland Kitchen morphine (MS CONTIN) 30 MG 12 hr tablet Take 30 mg by mouth 3 (three) times daily.    . Multiple Vitamin (MULTIVITAMIN) tablet Take 1 tablet by mouth daily.    Marland Kitchen oxyCODONE-acetaminophen (PERCOCET) 7.5-325 MG per tablet Take 1 tablet by mouth 3 (three) times daily as needed for severe pain.     Marland Kitchen zolpidem (AMBIEN CR) 6.25 MG CR tablet Take 6.25 mg by mouth at bedtime as needed for sleep.    Marland Kitchen  hydrOXYzine (ATARAX/VISTARIL) 25 MG tablet Take 25 mg by mouth every 8 (eight) hours as needed for anxiety.       Allergies:  Allergies  Allergen Reactions  . Sulfa Antibiotics Nausea And Vomiting  . Adhesive [Tape]   . Celebrex [Celecoxib]   . Lyrica [Pregabalin]   . Macrobid [Nitrofurantoin Macrocrystal]   . Penicillins     hives  . Tribenzor [Olmesartan-Amlodipine-Hctz]     dizziness  . Tricor [Fenofibrate]   . Vioxx [Rofecoxib]     Generally not well while taking this  . Cephalexin     Other reaction(s): Abdominal Pain    Family History  Problem Relation Age of Onset  . Cancer Neg Hx   . Early death Neg Hx   . Heart disease Neg Hx   . Hyperlipidemia Neg Hx   . Hypertension Neg Hx   . Stroke Neg Hx     Social History:  reports  that she has quit smoking. Her smoking use included cigarettes. She has a 20.00 pack-year smoking history. She has never used smokeless tobacco. She reports that she does not drink alcohol or use drugs.  ROS: A complete review of systems was performed.  All systems are negative except for pertinent findings as noted.  Physical Exam:  Vital signs in last 24 hours: Temp:  [98.8 F (37.1 C)] 98.8 F (37.1 C) (11/12 0728) Pulse Rate:  [59] 59 (11/12 0728) Resp:  [14] 14 (11/12 0728) BP: (177)/(76) 177/76 (11/12 0728) SpO2:  [98 %] 98 % (11/12 0728) Weight:  [63.5 kg-64 kg] 64 kg (11/12 0728) General:  Alert and oriented, No acute distress HEENT: Normocephalic, atraumatic Neck: No JVD or lymphadenopathy Cardiovascular: Regular rate and rhythm Lungs: Clear bilaterally Abdomen: Soft, nontender, nondistended, no abdominal masses Back: No CVA tenderness Extremities: No edema Neurologic: Grossly intact  Laboratory Data:  Results for orders placed or performed during the hospital encounter of 01/10/19 (from the past 24 hour(s))  I-STAT, chem 8     Status: Abnormal   Collection Time: 01/10/19  7:18 AM  Result Value Ref Range   Sodium 142 135 - 145 mmol/L   Potassium 3.7 3.5 - 5.1 mmol/L   Chloride 102 98 - 111 mmol/L   BUN 18 8 - 23 mg/dL   Creatinine, Ser 1.10 (H) 0.44 - 1.00 mg/dL   Glucose, Bld 102 (H) 70 - 99 mg/dL   Calcium, Ion 1.28 1.15 - 1.40 mmol/L   TCO2 27 22 - 32 mmol/L   Hemoglobin 11.6 (L) 12.0 - 15.0 g/dL   HCT 34.0 (L) 36.0 - 46.0 %   Recent Results (from the past 240 hour(s))  SARS CORONAVIRUS 2 (TAT 6-24 HRS) Nasopharyngeal Nasopharyngeal Swab     Status: None   Collection Time: 01/09/19  9:02 AM   Specimen: Nasopharyngeal Swab  Result Value Ref Range Status   SARS Coronavirus 2 NEGATIVE NEGATIVE Final    Comment: (NOTE) SARS-CoV-2 target nucleic acids are NOT DETECTED. The SARS-CoV-2 RNA is generally detectable in upper and lower respiratory specimens during  the acute phase of infection. Negative results do not preclude SARS-CoV-2 infection, do not rule out co-infections with other pathogens, and should not be used as the sole basis for treatment or other patient management decisions. Negative results must be combined with clinical observations, patient history, and epidemiological information. The expected result is Negative. Fact Sheet for Patients: SugarRoll.be Fact Sheet for Healthcare Providers: https://www.woods-mathews.com/ This test is not yet approved or cleared by the [place]  FDA and  has been authorized for detection and/or diagnosis of SARS-CoV-2 by FDA under an Emergency Use Authorization (EUA). This EUA will remain  in effect (meaning this test can be used) for the duration of the COVID-19 declaration under Section 56 4(b)(1) of the Act, 21 U.S.C. section 360bbb-3(b)(1), unless the authorization is terminated or revoked sooner. Performed at [facility], [address] [place], [place] [address]    Creatinine: Recent Labs    01/10/19 [id number]  CREATININE 1.10*    Radiologic Imaging: No results found.  Impression/Assessment:   right upper ureteral stone, status post stenting  Plan:    Cystoscopy, right double-J stent extraction, right ureteroscopy, holmium laser lithotripsy and extraction of stone, placement of stent  Bertram MillardStephen M Thersa Mohiuddin 01/10/2019, 8:18 AM  Bertram MillardStephen M. Kimya Mccahill MD

## 2019-01-10 NOTE — Op Note (Signed)
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  She underwent urgent stent placement on October 15.  She presents currently for ureteroscopic management of the stone i.e. stone fragmentation with laser, extraction and double-J stent replacement.  I have discussed the process with the patient.  Risks and complications include ureteral injury, need for second procedure, bleeding, anesthetic complications, infection, among others.  The patient understands these and desires to proceed.  Findings: Bladder appeared normal except for the previously mentioned stent.  Right ureter was normal.  Right renal pelvis and calyces were normal except for a posteriorly laying renal calculus.  Description of procedure: The patient was properly identified and marked in the holding area.  She received preoperative IV antibiotics and was taken to the operating room where general anesthetic was administered with the LMA.  She was placed in the dorsolithotomy position.  Genitalia and perineum were prepped and draped.  Proper timeout was performed.  21 French panendoscope advanced into her bladder.  Brief inspection was performed with normal findings.  The ureteral stent was grasped and brought out through the urethral meatus.  I then negotiated a guidewire through this, up to the renal pelvis and the  upper pole calyces were curl was seen.  It was evident at this point that the stone was in the renal pelvis.  I then passed a 31 French ureteral access catheter, first with just the obturator, then the entire unit.  The guidewire and the obturator were removed.  The flexible dual-lumen ureteroscope was advanced easily into the right renal pelvis where the stone was encountered.  Using a 200 m fiber and laser settings of 1 J and 30 Hz, the stone was fragmented into multiple smaller fragments which ended up in both the renal pelvis and some of the calyces in the interpolar region.  Following adequate fragmentation, the nitinol basket was utilized to grasp the fragments and bring them through the ureteral access catheter.  Multiple fragments were produced by the laser lithotripsy, and I have removed as many of these as could be grasped with the basket.  Following careful inspection and no further stone material present it could be removed, the ureteroscope and basket were removed.  I placed the guidewire back through the ureteral access catheter which was then removed.  Guidewire was backloaded through the cystoscope and a 24 cm x 6 French contour double-J stent was passed using fluoroscopic and cystoscopic guidance.  Once the guidewire was removed adequate proximal and distal curls were seen on the double-J stent.  At this point the scope was removed after the bladder was drained.  At this point the patient was extubated, awakened and taken to the PACU in stable condition.  She tolerated the procedure well.

## 2019-01-11 ENCOUNTER — Encounter (HOSPITAL_BASED_OUTPATIENT_CLINIC_OR_DEPARTMENT_OTHER): Payer: Self-pay | Admitting: Urology

## 2019-01-11 LAB — URINE CULTURE: Culture: 10000 — AB

## 2019-01-11 NOTE — Anesthesia Postprocedure Evaluation (Signed)
Anesthesia Post Note  Patient: Mary [name]  Procedure(s) Performed: CYSTOSCOPY WITH RETROGRADE PYELOGRAM, URETEROSCOPY AND STENT PLACEMENT (Right Ureter) HOLMIUM LASER APPLICATION (Right Ureter)     Patient location during evaluation: PACU Anesthesia Type: General Level of consciousness: awake and alert Pain management: pain level controlled Vital Signs Assessment: post-procedure vital signs reviewed and stable Respiratory status: spontaneous breathing, nonlabored ventilation and respiratory function stable Cardiovascular status: blood pressure returned to baseline and stable Postop Assessment: no apparent nausea or vomiting Anesthetic complications: no    Last Vitals:  Vitals:   01/10/19 1210 01/10/19 1418  BP:  (!) 209/70  Pulse: 62 72  Resp: 19 14  Temp:  36.9 C  SpO2: 100% 95%    Last Pain:  Vitals:   01/10/19 1330  TempSrc:   PainSc: [place]

## 2019-01-17 DIAGNOSIS — N201 Calculus of ureter: Secondary | ICD-10-CM | POA: Diagnosis not present

## 2019-01-18 DIAGNOSIS — N10 Acute tubulo-interstitial nephritis: Secondary | ICD-10-CM | POA: Diagnosis not present

## 2019-01-22 DIAGNOSIS — N10 Acute tubulo-interstitial nephritis: Secondary | ICD-10-CM | POA: Diagnosis not present

## 2019-01-22 DIAGNOSIS — N201 Calculus of ureter: Secondary | ICD-10-CM | POA: Diagnosis not present

## 2019-01-29 DIAGNOSIS — M961 Postlaminectomy syndrome, not elsewhere classified: Secondary | ICD-10-CM | POA: Diagnosis not present

## 2019-01-29 DIAGNOSIS — M25511 Pain in right shoulder: Secondary | ICD-10-CM | POA: Diagnosis not present

## 2019-01-29 DIAGNOSIS — G894 Chronic pain syndrome: Secondary | ICD-10-CM | POA: Diagnosis not present

## 2019-01-29 DIAGNOSIS — M19011 Primary osteoarthritis, right shoulder: Secondary | ICD-10-CM | POA: Diagnosis not present

## 2019-03-13 DIAGNOSIS — N201 Calculus of ureter: Secondary | ICD-10-CM | POA: Diagnosis not present

## 2019-03-13 DIAGNOSIS — N110 Nonobstructive reflux-associated chronic pyelonephritis: Secondary | ICD-10-CM | POA: Diagnosis not present

## 2019-03-28 DIAGNOSIS — M25511 Pain in right shoulder: Secondary | ICD-10-CM | POA: Diagnosis not present

## 2019-03-28 DIAGNOSIS — M19011 Primary osteoarthritis, right shoulder: Secondary | ICD-10-CM | POA: Diagnosis not present

## 2019-03-28 DIAGNOSIS — M961 Postlaminectomy syndrome, not elsewhere classified: Secondary | ICD-10-CM | POA: Diagnosis not present

## 2019-03-28 DIAGNOSIS — G894 Chronic pain syndrome: Secondary | ICD-10-CM | POA: Diagnosis not present

## 2019-03-29 DIAGNOSIS — N200 Calculus of kidney: Secondary | ICD-10-CM | POA: Diagnosis not present

## 2019-05-10 ENCOUNTER — Other Ambulatory Visit: Payer: Self-pay | Admitting: Urology

## 2019-05-16 ENCOUNTER — Encounter (HOSPITAL_BASED_OUTPATIENT_CLINIC_OR_DEPARTMENT_OTHER): Payer: Self-pay | Admitting: Urology

## 2019-05-16 DIAGNOSIS — N200 Calculus of kidney: Secondary | ICD-10-CM | POA: Diagnosis not present

## 2019-05-16 DIAGNOSIS — N110 Nonobstructive reflux-associated chronic pyelonephritis: Secondary | ICD-10-CM | POA: Diagnosis not present

## 2019-05-20 ENCOUNTER — Other Ambulatory Visit: Payer: Self-pay

## 2019-05-20 ENCOUNTER — Encounter (HOSPITAL_BASED_OUTPATIENT_CLINIC_OR_DEPARTMENT_OTHER): Payer: Self-pay | Admitting: Urology

## 2019-05-20 NOTE — Progress Notes (Signed)
Spoke w/ via phone for pre-op interview---Mary [name] needs dos---- I stat 8             [name] results------ekg 01-14-2020 epic, chest xray 12-13-2018 epic COVID test ------05-21-2019 at 1455 Arrive at -------945 am 05-24-2019 NPO after ------midnight Medications to take morning of surgery -----flonase, morphine, methocarbamol Diabetic medication -----n/a Patient Special Instructions -----none Pre-Op special Istructions -----none Patient verbalized understanding of instructions that were given at this phone interview. Patient denies shortness of breath, chest pain, fever, cough a this phone interview.

## 2019-05-21 ENCOUNTER — Other Ambulatory Visit (HOSPITAL_COMMUNITY)
Admission: RE | Admit: 2019-05-21 | Discharge: 2019-05-21 | Disposition: A | Discharge status: Home / Self Care | Payer: Medicare Other | Source: Ambulatory Visit | Attending: Urology | Admitting: Urology

## 2019-05-21 DIAGNOSIS — Z881 Allergy status to other antibiotic agents status: Secondary | ICD-10-CM | POA: Diagnosis not present

## 2019-05-21 DIAGNOSIS — F1120 Opioid dependence, uncomplicated: Secondary | ICD-10-CM | POA: Diagnosis not present

## 2019-05-21 DIAGNOSIS — M3500 Sicca syndrome, unspecified: Secondary | ICD-10-CM | POA: Diagnosis not present

## 2019-05-21 DIAGNOSIS — Z20822 Contact with and (suspected) exposure to covid-19: Secondary | ICD-10-CM | POA: Diagnosis not present

## 2019-05-21 DIAGNOSIS — N183 Chronic kidney disease, stage 3 unspecified: Secondary | ICD-10-CM | POA: Diagnosis not present

## 2019-05-21 DIAGNOSIS — E7800 Pure hypercholesterolemia, unspecified: Secondary | ICD-10-CM | POA: Diagnosis not present

## 2019-05-21 DIAGNOSIS — N200 Calculus of kidney: Secondary | ICD-10-CM | POA: Diagnosis not present

## 2019-05-21 DIAGNOSIS — R519 Headache, unspecified: Secondary | ICD-10-CM | POA: Diagnosis not present

## 2019-05-21 DIAGNOSIS — E079 Disorder of thyroid, unspecified: Secondary | ICD-10-CM | POA: Diagnosis not present

## 2019-05-21 DIAGNOSIS — Z90710 Acquired absence of both cervix and uterus: Secondary | ICD-10-CM | POA: Diagnosis not present

## 2019-05-21 DIAGNOSIS — K219 Gastro-esophageal reflux disease without esophagitis: Secondary | ICD-10-CM | POA: Diagnosis not present

## 2019-05-21 DIAGNOSIS — Z91048 Other nonmedicinal substance allergy status: Secondary | ICD-10-CM | POA: Diagnosis not present

## 2019-05-21 DIAGNOSIS — Z888 Allergy status to other drugs, medicaments and biological substances status: Secondary | ICD-10-CM | POA: Diagnosis not present

## 2019-05-21 DIAGNOSIS — M1990 Unspecified osteoarthritis, unspecified site: Secondary | ICD-10-CM | POA: Diagnosis not present

## 2019-05-21 DIAGNOSIS — I129 Hypertensive chronic kidney disease with stage 1 through stage 4 chronic kidney disease, or unspecified chronic kidney disease: Secondary | ICD-10-CM | POA: Diagnosis not present

## 2019-05-21 DIAGNOSIS — Z87442 Personal history of urinary calculi: Secondary | ICD-10-CM | POA: Diagnosis not present

## 2019-05-21 DIAGNOSIS — J45909 Unspecified asthma, uncomplicated: Secondary | ICD-10-CM | POA: Diagnosis not present

## 2019-05-21 DIAGNOSIS — G894 Chronic pain syndrome: Secondary | ICD-10-CM | POA: Diagnosis not present

## 2019-05-21 DIAGNOSIS — Z882 Allergy status to sulfonamides status: Secondary | ICD-10-CM | POA: Diagnosis not present

## 2019-05-21 DIAGNOSIS — Z87891 Personal history of nicotine dependence: Secondary | ICD-10-CM | POA: Diagnosis not present

## 2019-05-21 DIAGNOSIS — Z981 Arthrodesis status: Secondary | ICD-10-CM | POA: Diagnosis not present

## 2019-05-21 DIAGNOSIS — M797 Fibromyalgia: Secondary | ICD-10-CM | POA: Diagnosis not present

## 2019-05-21 DIAGNOSIS — Z880 Allergy status to penicillin: Secondary | ICD-10-CM | POA: Diagnosis not present

## 2019-05-21 DIAGNOSIS — F419 Anxiety disorder, unspecified: Secondary | ICD-10-CM | POA: Diagnosis not present

## 2019-05-21 LAB — SARS CORONAVIRUS 2 (TAT 6-24 HRS): SARS Coronavirus 2: NEGATIVE

## 2019-05-23 DIAGNOSIS — G894 Chronic pain syndrome: Secondary | ICD-10-CM | POA: Diagnosis not present

## 2019-05-23 DIAGNOSIS — M25511 Pain in right shoulder: Secondary | ICD-10-CM | POA: Diagnosis not present

## 2019-05-23 DIAGNOSIS — M961 Postlaminectomy syndrome, not elsewhere classified: Secondary | ICD-10-CM | POA: Diagnosis not present

## 2019-05-23 DIAGNOSIS — M19011 Primary osteoarthritis, right shoulder: Secondary | ICD-10-CM | POA: Diagnosis not present

## 2019-05-24 ENCOUNTER — Encounter (HOSPITAL_BASED_OUTPATIENT_CLINIC_OR_DEPARTMENT_OTHER): Payer: Self-pay | Admitting: Urology

## 2019-05-24 ENCOUNTER — Ambulatory Visit (HOSPITAL_BASED_OUTPATIENT_CLINIC_OR_DEPARTMENT_OTHER)
Admission: RE | Admit: 2019-05-24 | Discharge: 2019-05-24 | Disposition: A | Discharge status: Home / Self Care | Payer: Medicare Other | Attending: Urology | Admitting: Urology

## 2019-05-24 ENCOUNTER — Ambulatory Visit (HOSPITAL_BASED_OUTPATIENT_CLINIC_OR_DEPARTMENT_OTHER): Payer: Medicare Other | Admitting: Anesthesiology

## 2019-05-24 ENCOUNTER — Encounter (HOSPITAL_BASED_OUTPATIENT_CLINIC_OR_DEPARTMENT_OTHER)
Admission: RE | Disposition: A | Discharge status: Home / Self Care | Payer: Self-pay | Source: Home / Self Care | Attending: Urology

## 2019-05-24 DIAGNOSIS — Z87891 Personal history of nicotine dependence: Secondary | ICD-10-CM | POA: Diagnosis not present

## 2019-05-24 DIAGNOSIS — R519 Headache, unspecified: Secondary | ICD-10-CM | POA: Diagnosis not present

## 2019-05-24 DIAGNOSIS — J45909 Unspecified asthma, uncomplicated: Secondary | ICD-10-CM | POA: Diagnosis not present

## 2019-05-24 DIAGNOSIS — Z881 Allergy status to other antibiotic agents status: Secondary | ICD-10-CM | POA: Insufficient documentation

## 2019-05-24 DIAGNOSIS — F1120 Opioid dependence, uncomplicated: Secondary | ICD-10-CM | POA: Diagnosis not present

## 2019-05-24 DIAGNOSIS — Z880 Allergy status to penicillin: Secondary | ICD-10-CM | POA: Insufficient documentation

## 2019-05-24 DIAGNOSIS — N179 Acute kidney failure, unspecified: Secondary | ICD-10-CM | POA: Diagnosis not present

## 2019-05-24 DIAGNOSIS — M797 Fibromyalgia: Secondary | ICD-10-CM | POA: Insufficient documentation

## 2019-05-24 DIAGNOSIS — I129 Hypertensive chronic kidney disease with stage 1 through stage 4 chronic kidney disease, or unspecified chronic kidney disease: Secondary | ICD-10-CM | POA: Insufficient documentation

## 2019-05-24 DIAGNOSIS — M1990 Unspecified osteoarthritis, unspecified site: Secondary | ICD-10-CM | POA: Insufficient documentation

## 2019-05-24 DIAGNOSIS — K219 Gastro-esophageal reflux disease without esophagitis: Secondary | ICD-10-CM | POA: Diagnosis not present

## 2019-05-24 DIAGNOSIS — F419 Anxiety disorder, unspecified: Secondary | ICD-10-CM | POA: Diagnosis not present

## 2019-05-24 DIAGNOSIS — G894 Chronic pain syndrome: Secondary | ICD-10-CM | POA: Insufficient documentation

## 2019-05-24 DIAGNOSIS — Z20822 Contact with and (suspected) exposure to covid-19: Secondary | ICD-10-CM | POA: Insufficient documentation

## 2019-05-24 DIAGNOSIS — N183 Chronic kidney disease, stage 3 unspecified: Secondary | ICD-10-CM | POA: Insufficient documentation

## 2019-05-24 DIAGNOSIS — Z981 Arthrodesis status: Secondary | ICD-10-CM | POA: Insufficient documentation

## 2019-05-24 DIAGNOSIS — E7800 Pure hypercholesterolemia, unspecified: Secondary | ICD-10-CM | POA: Insufficient documentation

## 2019-05-24 DIAGNOSIS — N200 Calculus of kidney: Secondary | ICD-10-CM | POA: Insufficient documentation

## 2019-05-24 DIAGNOSIS — M3500 Sicca syndrome, unspecified: Secondary | ICD-10-CM | POA: Insufficient documentation

## 2019-05-24 DIAGNOSIS — Z87442 Personal history of urinary calculi: Secondary | ICD-10-CM | POA: Insufficient documentation

## 2019-05-24 DIAGNOSIS — Z886 Allergy status to analgesic agent status: Secondary | ICD-10-CM | POA: Insufficient documentation

## 2019-05-24 DIAGNOSIS — Z90710 Acquired absence of both cervix and uterus: Secondary | ICD-10-CM | POA: Insufficient documentation

## 2019-05-24 DIAGNOSIS — E079 Disorder of thyroid, unspecified: Secondary | ICD-10-CM | POA: Insufficient documentation

## 2019-05-24 DIAGNOSIS — Z91048 Other nonmedicinal substance allergy status: Secondary | ICD-10-CM | POA: Insufficient documentation

## 2019-05-24 DIAGNOSIS — Z888 Allergy status to other drugs, medicaments and biological substances status: Secondary | ICD-10-CM | POA: Insufficient documentation

## 2019-05-24 DIAGNOSIS — Z882 Allergy status to sulfonamides status: Secondary | ICD-10-CM | POA: Insufficient documentation

## 2019-05-24 HISTORY — DX: Personal history of gestational diabetes: Z86.32

## 2019-05-24 HISTORY — PX: CYSTOSCOPY WITH RETROGRADE PYELOGRAM, URETEROSCOPY AND STENT PLACEMENT: SHX5789

## 2019-05-24 HISTORY — DX: Other chronic pain: G89.29

## 2019-05-24 HISTORY — DX: Chronic pain syndrome: G89.4

## 2019-05-24 HISTORY — DX: Other specified postprocedural states: Z98.890

## 2019-05-24 HISTORY — PX: HOLMIUM LASER APPLICATION: SHX5852

## 2019-05-24 HISTORY — DX: Fibromyalgia: M79.7

## 2019-05-24 HISTORY — DX: Other chronic pain: M54.50

## 2019-05-24 HISTORY — DX: Unspecified osteoarthritis, unspecified site: M19.90

## 2019-05-24 HISTORY — DX: Other specified postprocedural states: R11.2

## 2019-05-24 LAB — POCT I-STAT, CHEM 8
BUN: 22 mg/dL (ref 8–23)
Calcium, Ion: 1.22 mmol/L (ref 1.15–1.40)
Chloride: 104 mmol/L (ref 98–111)
Creatinine, Ser: 1.1 mg/dL — ABNORMAL HIGH (ref 0.44–1.00)
Glucose, Bld: 96 mg/dL (ref 70–99)
HCT: 38 % (ref 36.0–46.0)
Hemoglobin: 12.9 g/dL (ref 12.0–15.0)
Potassium: 4.2 mmol/L (ref 3.5–5.1)
Sodium: 141 mmol/L (ref 135–145)
TCO2: 33 mmol/L — ABNORMAL HIGH (ref 22–32)

## 2019-05-24 SURGERY — CYSTOURETEROSCOPY, WITH RETROGRADE PYELOGRAM AND STENT INSERTION
Anesthesia: General | Site: Ureter | Laterality: Right

## 2019-05-24 MED ORDER — CIPROFLOXACIN IN D5W 400 MG/200ML IV SOLN
400.0000 mg | INTRAVENOUS | Status: AC
  Administered 2019-05-24: 400 mg via INTRAVENOUS
  Filled 2019-05-24: qty 200

## 2019-05-24 MED ORDER — PROPOFOL 10 MG/ML IV BOLUS
INTRAVENOUS | Status: DC | PRN
  Administered 2019-05-24: 30 mg via INTRAVENOUS
  Administered 2019-05-24: 100 mg via INTRAVENOUS

## 2019-05-24 MED ORDER — FENTANYL CITRATE (PF) 100 MCG/2ML IJ SOLN
INTRAMUSCULAR | Status: AC
  Filled 2019-05-24: qty 2

## 2019-05-24 MED ORDER — EPHEDRINE SULFATE 50 MG/ML IJ SOLN
INTRAMUSCULAR | Status: DC | PRN
  Administered 2019-05-24: 20 mg via INTRAVENOUS

## 2019-05-24 MED ORDER — GLYCOPYRROLATE 0.2 MG/ML IJ SOLN
INTRAMUSCULAR | Status: DC | PRN
  Administered 2019-05-24: .1 mg via INTRAVENOUS

## 2019-05-24 MED ORDER — SODIUM CHLORIDE 0.9 % IR SOLN
Status: DC | PRN
  Administered 2019-05-24: 3000 mL

## 2019-05-24 MED ORDER — MIDAZOLAM HCL 2 MG/2ML IJ SOLN
INTRAMUSCULAR | Status: AC
  Filled 2019-05-24: qty 2

## 2019-05-24 MED ORDER — FENTANYL CITRATE (PF) 100 MCG/2ML IJ SOLN
INTRAMUSCULAR | Status: DC | PRN
  Administered 2019-05-24 (×4): 25 ug via INTRAVENOUS

## 2019-05-24 MED ORDER — MIDAZOLAM HCL 5 MG/5ML IJ SOLN
INTRAMUSCULAR | Status: DC | PRN
  Administered 2019-05-24: 2 mg via INTRAVENOUS

## 2019-05-24 MED ORDER — PROPOFOL 10 MG/ML IV BOLUS
INTRAVENOUS | Status: AC
  Filled 2019-05-24: qty 40

## 2019-05-24 MED ORDER — ACETAMINOPHEN 500 MG PO TABS
ORAL_TABLET | ORAL | Status: AC
  Filled 2019-05-24: qty 2

## 2019-05-24 MED ORDER — ONDANSETRON HCL 4 MG/2ML IJ SOLN
INTRAMUSCULAR | Status: AC
  Filled 2019-05-24: qty 2

## 2019-05-24 MED ORDER — LIDOCAINE 2% (20 MG/ML) 5 ML SYRINGE
INTRAMUSCULAR | Status: AC
  Filled 2019-05-24: qty 5

## 2019-05-24 MED ORDER — ONDANSETRON HCL 4 MG/2ML IJ SOLN
INTRAMUSCULAR | Status: DC | PRN
  Administered 2019-05-24: 4 mg via INTRAVENOUS

## 2019-05-24 MED ORDER — SCOPOLAMINE 1 MG/3DAYS TD PT72
1.0000 | MEDICATED_PATCH | TRANSDERMAL | Status: DC
  Administered 2019-05-24: 1.5 mg via TRANSDERMAL
  Filled 2019-05-24: qty 1

## 2019-05-24 MED ORDER — STERILE WATER FOR IRRIGATION IR SOLN
Status: DC | PRN
  Administered 2019-05-24: 500 mL

## 2019-05-24 MED ORDER — DEXAMETHASONE SODIUM PHOSPHATE 10 MG/ML IJ SOLN
INTRAMUSCULAR | Status: AC
  Filled 2019-05-24: qty 1

## 2019-05-24 MED ORDER — SCOPOLAMINE 1 MG/3DAYS TD PT72
MEDICATED_PATCH | TRANSDERMAL | Status: AC
  Filled 2019-05-24: qty 1

## 2019-05-24 MED ORDER — DOXYCYCLINE HYCLATE 100 MG PO TABS: 100.0000 mg | ORAL_TABLET | Freq: Two times a day (BID) | ORAL | 0 refills | Status: AC

## 2019-05-24 MED ORDER — CIPROFLOXACIN IN D5W 400 MG/200ML IV SOLN
INTRAVENOUS | Status: AC
  Filled 2019-05-24: qty 200

## 2019-05-24 MED ORDER — GLYCOPYRROLATE PF 0.2 MG/ML IJ SOSY
PREFILLED_SYRINGE | INTRAMUSCULAR | Status: AC
  Filled 2019-05-24: qty 1

## 2019-05-24 MED ORDER — EPHEDRINE 5 MG/ML INJ
INTRAVENOUS | Status: AC
  Filled 2019-05-24: qty 10

## 2019-05-24 MED ORDER — DEXAMETHASONE SODIUM PHOSPHATE 4 MG/ML IJ SOLN
INTRAMUSCULAR | Status: DC | PRN
  Administered 2019-05-24: 5 mg via INTRAVENOUS

## 2019-05-24 MED ORDER — FENTANYL CITRATE (PF) 100 MCG/2ML IJ SOLN
25.0000 ug | INTRAMUSCULAR | Status: DC | PRN
  Administered 2019-05-24 (×2): 25 ug via INTRAVENOUS
  Administered 2019-05-24: 50 ug via INTRAVENOUS
  Filled 2019-05-24: qty 1

## 2019-05-24 MED ORDER — SODIUM CHLORIDE 0.9 % IV SOLN
INTRAVENOUS | Status: DC
  Filled 2019-05-24: qty 1000

## 2019-05-24 MED ORDER — ACETAMINOPHEN 500 MG PO TABS
1000.0000 mg | ORAL_TABLET | Freq: Once | ORAL | Status: AC
  Administered 2019-05-24: 1000 mg via ORAL
  Filled 2019-05-24: qty 2

## 2019-05-24 SURGICAL SUPPLY — 27 items
BAG DRAIN URO-CYSTO SKYTR STRL (DRAIN) ×2 IMPLANT
BASKET ZERO TIP NITINOL 2.4FR (BASKET) IMPLANT
CATH INTERMIT  6FR 70CM (CATHETERS) ×2 IMPLANT
CLOTH BEACON ORANGE TIMEOUT ST (SAFETY) ×2 IMPLANT
ELECT REM PT RETURN 9FT ADLT (ELECTROSURGICAL)
ELECTRODE REM PT RTRN 9FT ADLT (ELECTROSURGICAL) IMPLANT
EXTRACTOR STONE NITINOL NGAGE (UROLOGICAL SUPPLIES) ×2 IMPLANT
FIBER LASER TRAC TIP (UROLOGICAL SUPPLIES) ×2 IMPLANT
GLOVE BIO SURGEON STRL SZ 6.5 (GLOVE) ×2 IMPLANT
GLOVE BIO SURGEON STRL SZ8 (GLOVE) ×2 IMPLANT
GLOVE BIOGEL PI IND STRL 6.5 (GLOVE) ×2 IMPLANT
GLOVE BIOGEL PI INDICATOR 6.5 (GLOVE) ×2
GOWN STRL REUS W/ TWL XL LVL3 (GOWN DISPOSABLE) ×1 IMPLANT
GOWN STRL REUS W/TWL LRG LVL3 (GOWN DISPOSABLE) ×2 IMPLANT
GOWN STRL REUS W/TWL XL LVL3 (GOWN DISPOSABLE) ×1
GUIDEWIRE ANG ZIPWIRE 038X150 (WIRE) IMPLANT
GUIDEWIRE STR DUAL SENSOR (WIRE) IMPLANT
IV NS IRRIG 3000ML ARTHROMATIC (IV SOLUTION) ×2 IMPLANT
KIT TURNOVER CYSTO (KITS) ×2 IMPLANT
MANIFOLD NEPTUNE II (INSTRUMENTS) ×2 IMPLANT
NS IRRIG 500ML POUR BTL (IV SOLUTION) IMPLANT
PACK CYSTO (CUSTOM PROCEDURE TRAY) ×2 IMPLANT
SHEATH URETERAL 12FRX28CM (UROLOGICAL SUPPLIES) ×2 IMPLANT
STENT URET 6FRX24 CONTOUR (STENTS) ×2 IMPLANT
TUBE CONNECTING 12X1/4 (SUCTIONS) ×2 IMPLANT
TUBING UROLOGY SET (TUBING) ×2 IMPLANT
WATER STERILE IRR 500ML POUR (IV SOLUTION) ×2 IMPLANT

## 2019-05-24 NOTE — Transfer of Care (Signed)
Immediate Anesthesia Transfer of Care Note  Patient: Mary [name]  Procedure(s) Performed: Procedure(s) (LRB): CYSTOSCOPY WITH RETROGRADE PYELOGRAM, URETEROSCOPY AND STENT PLACEMENT (Right) HOLMIUM LASER APPLICATION (Right)  Patient Location: PACU  Anesthesia Type: General  Level of Consciousness: awake, sedated, patient cooperative and responds to stimulation  Airway & Oxygen Therapy: Patient Spontanous Breathing and Patient connected to [place] 02 and soft FM   Post-op Assessment: Report given to PACU RN, Post -op Vital signs reviewed and stable and Patient moving all extremities  Post vital signs: Reviewed and stable  Complications: No apparent anesthesia complications

## 2019-05-24 NOTE — H&P (Signed)
H&P  Chief Complaint: Right-sided kidney stone  History of Present Illness:  69 year old female presents for 2nd part of a staged procedure for ureteroscopic management of a large right renal stone.  She was urgently stented in October of 2020 for an infected, obstructing stone.  She was treated with antibiotics in underwent 1st stage of ureteroscopic management of this stone about a month later.  She presents at this time for ureteroscopic management of residual stone burden.  Recent urine culture was negative.  Past Medical History:  Diagnosis Date  . Allergy   . Appendix disease    has stump since removal  . Asthma yrs ago  . Chronic kidney disease    arf 12-23-2018 hospital admit ckd stage 3  . Chronic low back pain   . Chronic pain    lower back and cervical  . Chronic pain syndrome   . Complication of anesthesia    low temperature 1998 with hysterectomy, no problems since, decreased body temperature  . Fibromyalgia   . Gallstones   . GERD (gastroesophageal reflux disease)   . High cholesterol   . History of gestational diabetes yrs ago  . History of kidney stones   . Hypertension   . Migraines   . OA (osteoarthritis)   . PONV (postoperative nausea and vomiting)    with 01-10-2019 surgery  . Sjogrens syndrome (HCC)   . Thyroid disease    3 cysts in pasy went away    Past Surgical History:  Procedure Laterality Date  . ABDOMINAL HYSTERECTOMY  1998   complete  . ANTERIOR CERVICAL DECOMP/DISCECTOMY FUSION  2011  . APPENDECTOMY    . BREAST BIOPSY  1971   benign tumor removed  . cataracts both eyes    . CYSTOSCOPY W/ URETERAL STENT PLACEMENT Right 12/13/2018   Procedure: CYSTOSCOPY WITH URETERAL STENT PLACEMENT;  Surgeon: Marcine Matar, MD;  Location: [facility] OR;  Service: Urology;  Laterality: Right;  . CYSTOSCOPY WITH RETROGRADE PYELOGRAM, URETEROSCOPY AND STENT PLACEMENT Right 01/10/2019   Procedure: CYSTOSCOPY WITH RETROGRADE PYELOGRAM, URETEROSCOPY AND STENT  PLACEMENT;  Surgeon: Marcine Matar, MD;  Location: [facility];  Service: Urology;  Laterality: Right;  . HOLMIUM LASER APPLICATION Right 01/10/2019   Procedure: HOLMIUM LASER APPLICATION;  Surgeon: Marcine Matar, MD;  Location: [facility];  Service: Urology;  Laterality: Right;  . KIDNEY STONE SURGERY  2011  . LUMBAR FUSION  2005  . SHOULDER SURGERY Right 2020   bone spurs removed and tendon repair  . TONSILLECTOMY  1971    Home Medications:  Allergies as of 05/24/2019      Reactions   Sulfa Antibiotics Nausea And Vomiting   Adhesive [tape]    Skin looks burned   Apresoline [hydralazine]    Increased blood pressure sweeling   Celebrex [celecoxib]    Not sure   Lyrica [pregabalin]    Face and tongue swelling   Macrobid [nitrofurantoin Macrocrystal]    dizzy   Penicillins    hives   Tribenzor [olmesartan-amlodipine-hctz]    dizziness   Tricor [fenofibrate]    Not sure   Vioxx [rofecoxib]    Generally not well while taking this   Cephalexin    Other reaction(s): Abdominal Pain      Medication List    Notice   Cannot display discharge medications because the patient has not yet been admitted.     Allergies:  Allergies  Allergen Reactions  . Sulfa Antibiotics Nausea And Vomiting  . Adhesive [  Tape]     Skin looks burned  . Apresoline [Hydralazine]     Increased blood pressure sweeling  . Celebrex [Celecoxib]     Not sure  . Lyrica [Pregabalin]     Face and tongue swelling  . Macrobid [Nitrofurantoin Macrocrystal]     dizzy  . Penicillins     hives  . Tribenzor [Olmesartan-Amlodipine-Hctz]     dizziness  . Tricor [Fenofibrate]     Not sure  . Vioxx [Rofecoxib]     Generally not well while taking this  . Cephalexin     Other reaction(s): Abdominal Pain    Family History  Problem Relation Age of Onset  . Cancer Neg Hx   . Early death Neg Hx   . Heart disease Neg Hx   . Hyperlipidemia Neg Hx   . Hypertension Neg  Hx   . Stroke Neg Hx     Social History:  reports that she has quit smoking. Her smoking use included cigarettes. She has a 20.00 pack-year smoking history. She has never used smokeless tobacco. She reports that she does not drink alcohol or use drugs.  ROS: A complete review of systems was performed.  All systems are negative except for pertinent findings as noted.  Physical Exam:  Vital signs in last 24 hours: Ht 5\' 3"  (1.6 m)   Wt 63.5 kg   BMI 24.80 kg/m  Constitutional:  Alert and oriented, No acute distress Cardiovascular: Regular rate  Respiratory: Normal respiratory effort GI: Abdomen is soft, nontender, nondistended, no abdominal masses. No CVAT.  Genitourinary: Normal female phallus, testes are descended bilaterally and non-tender and without masses, scrotum is normal in appearance without lesions or masses, perineum is normal on inspection. Lymphatic: No lymphadenopathy Neurologic: Grossly intact, no focal deficits Psychiatric: Normal mood and affect  Laboratory Data:  No results for input(s): WBC, HGB, HCT, PLT in the last 72 hours.  No results for input(s): NA, K, CL, GLUCOSE, BUN, CALCIUM, CREATININE in the last 72 hours.  Invalid input(s): CO3   No results found for this or any previous visit (from the past 24 hour(s)). Recent Results (from the past 240 hour(s))  SARS CORONAVIRUS 2 (TAT 6-24 HRS) Nasopharyngeal Nasopharyngeal Swab     Status: None   Collection Time: 05/21/19  2:57 PM   Specimen: Nasopharyngeal Swab  Result Value Ref Range Status   SARS Coronavirus 2 NEGATIVE NEGATIVE Final    Comment: (NOTE) SARS-CoV-2 target nucleic acids are NOT DETECTED. The SARS-CoV-2 RNA is generally detectable in upper and lower respiratory specimens during the acute phase of infection. Negative results do not preclude SARS-CoV-2 infection, do not rule out co-infections with other pathogens, and should not be used as the sole basis for treatment or other patient  management decisions. Negative results must be combined with clinical observations, patient history, and epidemiological information. The expected result is Negative. Fact Sheet for Patients: 05/23/19 Fact Sheet for Healthcare Providers: HairSlick.no This test is not yet approved or cleared by the quier[place]irigir.com FDA and  has been authorized for detection and/or diagnosis of SARS-CoV-2 by FDA under an Emergency Use Authorization (EUA). This EUA will remain  in effect (meaning this test can be used) for the duration of the COVID-19 declaration under Section 56 4(b)(1) of the Act, 21 U.S.C. section 360bbb-3(b)(1), unless the authorization is terminated or revoked sooner. Performed at [facility], [address] [place], [place]     Renal Function: No results for input(s): CREATININE  in the last 168 hours. CrCl cannot be calculated (Patient's most recent lab result is older than the maximum 21 days allowed.).  Radiologic Imaging: No results found.  Impression/Assessment:    Right renal calculus  Plan:   cystoscopy, right retrograde ureteral pyelogram, right ureteroscopy, holmium laser and extraction of right renal calculus.  I have performed this procedure on the patient in November of 2020, this is the 2nd part of a staged procedure.  She understands the risks and complications involved and desires to proceed.

## 2019-05-24 NOTE — Discharge Instructions (Signed)
1. You may see some blood in the urine and may have some burning with urination for 48-72 hours. You also may notice that you have to urinate more frequently or urgently after your procedure which is normal.  2. You should call should you develop an inability urinate, fever > 101, persistent nausea and vomiting that prevents you from eating or drinking to stay hydrated.  3. If you have a stent, you will likely urinate more frequently and urgently until the stent is removed and you may experience some discomfort/pain in the lower abdomen and flank especially when urinating. You may take pain medication prescribed to you if needed for pain. You may also intermittently have blood in the urine until the stent is removed. OK to pull stent out Monday 3.29   Ureteral Stent Implantation, Care After This sheet gives you information about how to care for yourself after your procedure. Your health care provider may also give you more specific instructions. If you have problems or questions, contact your health care provider. What can I expect after the procedure? After the procedure, it is common to have:  Nausea.  Mild pain when you urinate. You may feel this pain in your lower back or lower abdomen. The pain should stop within a few minutes after you urinate. This may last for up to 1 week.  A small amount of blood in your urine for several days. Follow these instructions at home: Medicines  Take over-the-counter and prescription medicines only as told by your health care provider.  If you were prescribed an antibiotic medicine, take it as told by your health care provider. Do not stop taking the antibiotic even if you start to feel better.  Do not drive for 24 hours if you were given a sedative during your procedure.  Ask your health care provider if the medicine prescribed to you requires you to avoid driving or using heavy machinery. Activity  Rest as told by your health care provider.  Avoid  sitting for a long time without moving. Get up to take short walks every 1-2 hours. This is important to improve blood flow and breathing. Ask for help if you feel weak or unsteady.  Return to your normal activities as told by your health care provider. Ask your health care provider what activities are safe for you. General instructions   Watch for any blood in your urine. Call your health care provider if the amount of blood in your urine increases.  If you have a catheter: ? Follow instructions from your health care provider about taking care of your catheter and collection bag. ? Do not take baths, swim, or use a hot tub until your health care provider approves. Ask your health care provider if you may take showers. You may only be allowed to take sponge baths.  Drink enough fluid to keep your urine pale yellow.  Do not use any products that contain nicotine or tobacco, such as cigarettes, e-cigarettes, and chewing tobacco. These can delay healing after surgery. If you need help quitting, ask your health care provider.  Keep all follow-up visits as told by your health care provider. This is important. Contact a health care provider if:  You have pain that gets worse or does not get better with medicine, especially pain when you urinate.  You have difficulty urinating.  You feel nauseous or you vomit repeatedly during a period of more than 2 days after the procedure. Get help right away if:  Your  urine is dark red or has blood clots in it.  You are leaking urine (have incontinence).  The end of the stent comes out of your urethra.  You cannot urinate.  You have sudden, sharp, or severe pain in your abdomen or lower back.  You have a fever.  You have swelling or pain in your legs.  You have difficulty breathing. Summary  After the procedure, it is common to have mild pain when you urinate that goes away within a few minutes after you urinate. This may last for up to 1  week.  Watch for any blood in your urine. Call your health care provider if the amount of blood in your urine increases.  Take over-the-counter and prescription medicines only as told by your health care provider.  Drink enough fluid to keep your urine pale yellow. This information is not intended to replace advice given to you by your health care provider. Make sure you discuss any questions you have with your health care provider. Document Revised: 11/21/2017 Document Reviewed: 11/22/2017 Elsevier Patient Education  2020 [employer].   Post Anesthesia Home Care Instructions  Activity: Get plenty of rest for the remainder of the day. A responsible individual must stay with you for 24 hours following the procedure.  For the next 24 hours, DO NOT: -Drive a car -Advertising copywriter -Drink alcoholic beverages -Take any medication unless instructed by your physician -Make any legal decisions or sign important papers.  Meals: Start with liquid foods such as gelatin or soup. Progress to regular foods as tolerated. Avoid greasy, spicy, heavy foods. If nausea and/or vomiting occur, drink only clear liquids until the nausea and/or vomiting subsides. Call your physician if vomiting continues.  Special Instructions/Symptoms: Your throat may feel dry or sore from the anesthesia or the breathing tube placed in your throat during surgery. If this causes discomfort, gargle with warm salt water. The discomfort should disappear within 24 hours.  If you had a scopolamine patch placed behind your ear for the management of post- operative nausea and/or vomiting:  1. The medication in the patch is effective for 72 hours, after which it should be removed.  Wrap patch in a tissue and discard in the trash. Wash hands thoroughly with soap and water. 2. You may remove the patch earlier than 72 hours if you experience unpleasant side effects which may include dry mouth, dizziness or visual disturbances. 3.  Avoid touching the patch. Wash your hands with soap and water after contact with the patch.

## 2019-05-24 NOTE — Interval H&P Note (Signed)
History and Physical Interval Note:  05/24/2019 12:01 PM  Mary [name]  has presented today for surgery, with the diagnosis of RIGHT RENAL STONE.  The various methods of treatment have been discussed with the patient and family. After consideration of risks, benefits and other options for treatment, the patient has consented to  Procedure(s) with comments: CYSTOSCOPY WITH RETROGRADE PYELOGRAM, URETEROSCOPY AND STENT PLACEMENT (Right) - 1 HR HOLMIUM LASER APPLICATION (Right) as a surgical intervention.  The patient's history has been reviewed, patient examined, no change in status, stable for surgery.  I have reviewed the patient's chart and labs.  Questions were answered to the patient's satisfaction.     Bertram Millard Amira Podolak

## 2019-05-24 NOTE — Anesthesia Preprocedure Evaluation (Addendum)
Anesthesia Evaluation  Patient identified by MRN, date of birth, ID band Patient awake    Reviewed: Allergy & Precautions, NPO status , Patient's Chart, lab work & pertinent test results  History of Anesthesia Complications (+) PONV  Airway Mallampati: III  TM Distance: >3 FB Neck ROM: Full    Dental  (+) Edentulous Upper   Pulmonary asthma , former smoker,    Pulmonary exam normal breath sounds clear to auscultation       Cardiovascular hypertension, Normal cardiovascular exam Rhythm:Regular Rate:Normal     Neuro/Psych  Headaches, Anxiety negative psych ROS   GI/Hepatic Neg liver ROS, GERD  ,  Endo/Other  negative endocrine ROSH/o Sjogren's   Renal/GU Renal InsufficiencyRenal disease  negative genitourinary   Musculoskeletal  (+) Arthritis , Fibromyalgia -, narcotic dependent  Abdominal   Peds  Hematology negative hematology ROS (+)   Anesthesia Other Findings On MS Contin 30mg  BID and percocet 7.5/325mg   Reproductive/Obstetrics                            Anesthesia Physical Anesthesia Plan  ASA: III  Anesthesia Plan: General   Post-op Pain Management:    Induction: Intravenous  PONV Risk Score and Plan: 4 or greater and Ondansetron, Dexamethasone, Midazolam, Treatment may vary due to age or medical condition and Scopolamine patch - Pre-op  Airway Management Planned: LMA  Additional Equipment:   Intra-op Plan:   Post-operative Plan: Extubation in OR  Informed Consent: I have reviewed the patients History and Physical, chart, labs and discussed the procedure including the risks, benefits and alternatives for the proposed anesthesia with the patient or authorized representative who has indicated his/her understanding and acceptance.     Dental advisory given  Plan Discussed with: CRNA  Anesthesia Plan Comments:        Anesthesia Quick Evaluation

## 2019-05-24 NOTE — Op Note (Signed)
Preoperative diagnosis: Right renal calculus  Postoperative diagnosis:  Multiple small fragmented right renal calculi  Principle procedure:  Cystoscopy, right retrograde ureteral pyelogram, right ureteroscopy, extraction of multiple right renal calculi, placement of 6 French by 24 cm contour double-J stent without tether, fluoroscopic interpretation  Surgeon:  Royale Lennartz  Anesthesia:  General with LMA   Complications:  None  Specimen: Multiple stone fragments  drains:  Above-mentioned stent   Estimated blood loss: None  Indications:  69 year old female status post ureteroscopic management of large renal calculus several months ago.  She has remained fragments and presents this time for ureteroscopic management of remaining stone fragments.    Findings:  Bladder appeared normal, ureteral orifices normal.  Right retrograde ureteral pyelogram with Omnipaque revealed a normal ureter throughout.  Filling defect seen in the lower and interpolar calices.  No significant pyelocaliectasis.    Description of procedure: Following identification and marking of the patient in the holding urea she was taken the operating room where general anesthetic was administered with a LMA.  She was placed in the dorsolithotomy position.  She jet think perineum prepped and draped.  Proper time-out was performed.    21 French panendoscope was advanced into her bladder  With inspection carried out.  Following this the right ureter was cannulated with 6 [place] open-ended catheter and retrograde ureteral pyelogram performed.  This revealed a normal ureter throughout without significant narrowing or hydronephrosis.  Pie look calyceal system was normal.  At this point  A sensor tip guidewire was advanced through the open-ended catheter with a curl seen in the upper pole calyceal system.  The open-ended catheter and cystoscope were removed.  I sequentially dilated the ureter 1st with the obturator then the entire 12/14 short  ureteral access catheter.  Once dilation was performed, the guidewire in the obturator were removed.  The 6 French flexible ureteral scope was advanced through the ureteral access catheter and into the kidney.  The power calyceal system was inspected.  Multiple small fragments were seen in the interpolar calyx, a few small ones in the lower pole calices.  These were then all removed with the engage basket.  Several tiny fragments too small for the basket extraction were left remaining.  However, 2-3 dozen small fragments were extracted without difficulty through the access catheter.  Once all stone burden that was significant was removed, I then removed the ureteral scope.  The sensor tip guidewire was replaced through the access catheter, back loaded through the cystoscope and a 24 cm x 6 French contour double-J stent with tether was placed in the ureter with excellent proximal and distal curl seen following removal of the guidewire.  The bladder was then drained.  The tether was brought out through the urethra, tied in a knot outside the urethra, trimmed then tucked within the vagina.  At this point the procedure was terminated.  The patient was awakened in taken to the PACU in stable condition, having tolerated the procedure well.

## 2019-05-24 NOTE — Anesthesia Procedure Notes (Signed)
Procedure Name: LMA Insertion Date/Time: 05/24/2019 12:20 PM Performed by: Jessica Priest, CRNA Pre-anesthesia Checklist: Patient identified, Emergency Drugs available, Suction available, Patient being monitored and Timeout performed Patient Re-evaluated:Patient Re-evaluated prior to induction Oxygen Delivery Method: Circle system utilized Preoxygenation: Pre-oxygenation with 100% oxygen Induction Type: IV induction Ventilation: Mask ventilation without difficulty LMA: LMA inserted LMA Size: 4.0 Number of attempts: 1 Airway Equipment and Method: Bite block Placement Confirmation: positive ETCO2,  breath sounds checked- equal and bilateral and CO2 detector Tube secured with: Tape Dental Injury: Teeth and Oropharynx as per pre-operative assessment

## 2019-05-28 NOTE — Anesthesia Postprocedure Evaluation (Signed)
Anesthesia Post Note  Patient: Mary [name]  Procedure(s) Performed: CYSTOSCOPY WITH RETROGRADE PYELOGRAM, URETEROSCOPY AND STENT PLACEMENT (Right Ureter) HOLMIUM LASER APPLICATION (Right Ureter)     Patient location during evaluation: PACU Anesthesia Type: General Level of consciousness: awake and alert Pain management: pain level controlled Vital Signs Assessment: post-procedure vital signs reviewed and stable Respiratory status: spontaneous breathing, nonlabored ventilation, respiratory function stable and patient connected to nasal cannula oxygen Cardiovascular status: blood pressure returned to baseline and stable Postop Assessment: no apparent nausea or vomiting Anesthetic complications: no    Last Vitals:  Vitals:   05/24/19 1400 05/24/19 1436  BP:  136/66  Pulse: 65 60  Resp: 19   Temp:  36.6 C  SpO2: 99% 100%    Last Pain:  Vitals:   05/24/19 1445  TempSrc:   PainSc: 4    Pain Goal: Patients Stated Pain Goal: 3 (05/24/19 1332)                 Delsa Walder L Aurianna Earlywine

## 2019-06-06 ENCOUNTER — Other Ambulatory Visit: Payer: Self-pay | Admitting: Family Medicine

## 2019-06-06 DIAGNOSIS — Z1231 Encounter for screening mammogram for malignant neoplasm of breast: Secondary | ICD-10-CM

## 2019-06-07 DIAGNOSIS — N200 Calculus of kidney: Secondary | ICD-10-CM | POA: Diagnosis not present

## 2019-07-04 DIAGNOSIS — I10 Essential (primary) hypertension: Secondary | ICD-10-CM | POA: Diagnosis not present

## 2019-07-04 DIAGNOSIS — E559 Vitamin D deficiency, unspecified: Secondary | ICD-10-CM | POA: Diagnosis not present

## 2019-07-04 DIAGNOSIS — E041 Nontoxic single thyroid nodule: Secondary | ICD-10-CM | POA: Diagnosis not present

## 2019-07-04 DIAGNOSIS — E7800 Pure hypercholesterolemia, unspecified: Secondary | ICD-10-CM | POA: Diagnosis not present

## 2019-07-08 DIAGNOSIS — N200 Calculus of kidney: Secondary | ICD-10-CM | POA: Diagnosis not present

## 2019-07-08 DIAGNOSIS — N261 Atrophy of kidney (terminal): Secondary | ICD-10-CM | POA: Diagnosis not present

## 2019-07-10 DIAGNOSIS — Z0000 Encounter for general adult medical examination without abnormal findings: Secondary | ICD-10-CM | POA: Diagnosis not present

## 2019-07-10 DIAGNOSIS — I10 Essential (primary) hypertension: Secondary | ICD-10-CM | POA: Diagnosis not present

## 2019-07-10 DIAGNOSIS — G4700 Insomnia, unspecified: Secondary | ICD-10-CM | POA: Diagnosis not present

## 2019-07-10 DIAGNOSIS — E7800 Pure hypercholesterolemia, unspecified: Secondary | ICD-10-CM | POA: Diagnosis not present

## 2019-08-01 DIAGNOSIS — G894 Chronic pain syndrome: Secondary | ICD-10-CM | POA: Diagnosis not present

## 2019-08-01 DIAGNOSIS — M19011 Primary osteoarthritis, right shoulder: Secondary | ICD-10-CM | POA: Diagnosis not present

## 2019-08-01 DIAGNOSIS — M961 Postlaminectomy syndrome, not elsewhere classified: Secondary | ICD-10-CM | POA: Diagnosis not present

## 2019-08-01 DIAGNOSIS — M25511 Pain in right shoulder: Secondary | ICD-10-CM | POA: Diagnosis not present

## 2019-10-08 DIAGNOSIS — M25511 Pain in right shoulder: Secondary | ICD-10-CM | POA: Diagnosis not present

## 2019-10-08 DIAGNOSIS — M961 Postlaminectomy syndrome, not elsewhere classified: Secondary | ICD-10-CM | POA: Diagnosis not present

## 2019-10-08 DIAGNOSIS — M19011 Primary osteoarthritis, right shoulder: Secondary | ICD-10-CM | POA: Diagnosis not present

## 2019-10-08 DIAGNOSIS — G894 Chronic pain syndrome: Secondary | ICD-10-CM | POA: Diagnosis not present

## 2019-11-06 DIAGNOSIS — R109 Unspecified abdominal pain: Secondary | ICD-10-CM | POA: Diagnosis not present

## 2019-11-06 DIAGNOSIS — Z23 Encounter for immunization: Secondary | ICD-10-CM | POA: Diagnosis not present

## 2019-11-06 DIAGNOSIS — M542 Cervicalgia: Secondary | ICD-10-CM | POA: Diagnosis not present

## 2019-11-06 DIAGNOSIS — G479 Sleep disorder, unspecified: Secondary | ICD-10-CM | POA: Diagnosis not present

## 2020-03-19 DIAGNOSIS — M205X1 Other deformities of toe(s) (acquired), right foot: Secondary | ICD-10-CM | POA: Diagnosis not present

## 2020-03-24 DIAGNOSIS — M961 Postlaminectomy syndrome, not elsewhere classified: Secondary | ICD-10-CM | POA: Diagnosis not present

## 2020-03-24 DIAGNOSIS — G894 Chronic pain syndrome: Secondary | ICD-10-CM | POA: Diagnosis not present

## 2020-03-24 DIAGNOSIS — M5030 Other cervical disc degeneration, unspecified cervical region: Secondary | ICD-10-CM | POA: Diagnosis not present

## 2020-03-24 DIAGNOSIS — M47812 Spondylosis without myelopathy or radiculopathy, cervical region: Secondary | ICD-10-CM | POA: Diagnosis not present

## 2020-04-28 DIAGNOSIS — I10 Essential (primary) hypertension: Secondary | ICD-10-CM | POA: Diagnosis not present

## 2020-04-28 DIAGNOSIS — R5381 Other malaise: Secondary | ICD-10-CM | POA: Diagnosis not present

## 2020-04-28 DIAGNOSIS — G479 Sleep disorder, unspecified: Secondary | ICD-10-CM | POA: Diagnosis not present

## 2020-04-28 DIAGNOSIS — R42 Dizziness and giddiness: Secondary | ICD-10-CM | POA: Diagnosis not present

## 2020-05-06 DIAGNOSIS — K838 Other specified diseases of biliary tract: Secondary | ICD-10-CM | POA: Diagnosis not present

## 2020-05-06 DIAGNOSIS — K828 Other specified diseases of gallbladder: Secondary | ICD-10-CM | POA: Diagnosis not present

## 2020-05-06 DIAGNOSIS — N281 Cyst of kidney, acquired: Secondary | ICD-10-CM | POA: Diagnosis not present

## 2020-05-31 DIAGNOSIS — Z883 Allergy status to other anti-infective agents status: Secondary | ICD-10-CM | POA: Diagnosis not present

## 2020-05-31 DIAGNOSIS — M3500 Sicca syndrome, unspecified: Secondary | ICD-10-CM | POA: Diagnosis not present

## 2020-05-31 DIAGNOSIS — Z888 Allergy status to other drugs, medicaments and biological substances status: Secondary | ICD-10-CM | POA: Diagnosis not present

## 2020-05-31 DIAGNOSIS — Z881 Allergy status to other antibiotic agents status: Secondary | ICD-10-CM | POA: Diagnosis not present

## 2020-05-31 DIAGNOSIS — M797 Fibromyalgia: Secondary | ICD-10-CM | POA: Diagnosis not present

## 2020-05-31 DIAGNOSIS — I10 Essential (primary) hypertension: Secondary | ICD-10-CM | POA: Diagnosis not present

## 2020-05-31 DIAGNOSIS — Z880 Allergy status to penicillin: Secondary | ICD-10-CM | POA: Diagnosis not present

## 2020-05-31 DIAGNOSIS — Z79899 Other long term (current) drug therapy: Secondary | ICD-10-CM | POA: Diagnosis not present

## 2020-05-31 DIAGNOSIS — Z91048 Other nonmedicinal substance allergy status: Secondary | ICD-10-CM | POA: Diagnosis not present

## 2020-05-31 DIAGNOSIS — Z882 Allergy status to sulfonamides status: Secondary | ICD-10-CM | POA: Diagnosis not present

## 2020-05-31 DIAGNOSIS — M1990 Unspecified osteoarthritis, unspecified site: Secondary | ICD-10-CM | POA: Diagnosis not present

## 2020-05-31 DIAGNOSIS — S4992XA Unspecified injury of left shoulder and upper arm, initial encounter: Secondary | ICD-10-CM | POA: Diagnosis not present

## 2020-06-11 DIAGNOSIS — M47812 Spondylosis without myelopathy or radiculopathy, cervical region: Secondary | ICD-10-CM | POA: Diagnosis not present

## 2020-06-11 DIAGNOSIS — G894 Chronic pain syndrome: Secondary | ICD-10-CM | POA: Diagnosis not present

## 2020-06-11 DIAGNOSIS — M19011 Primary osteoarthritis, right shoulder: Secondary | ICD-10-CM | POA: Diagnosis not present

## 2020-06-11 DIAGNOSIS — M5030 Other cervical disc degeneration, unspecified cervical region: Secondary | ICD-10-CM | POA: Diagnosis not present

## 2020-06-11 DIAGNOSIS — M898X1 Other specified disorders of bone, shoulder: Secondary | ICD-10-CM | POA: Diagnosis not present

## 2020-06-11 DIAGNOSIS — M25511 Pain in right shoulder: Secondary | ICD-10-CM | POA: Diagnosis not present

## 2020-06-11 DIAGNOSIS — M961 Postlaminectomy syndrome, not elsewhere classified: Secondary | ICD-10-CM | POA: Diagnosis not present

## 2020-06-11 DIAGNOSIS — G4486 Cervicogenic headache: Secondary | ICD-10-CM | POA: Diagnosis not present

## 2020-06-11 DIAGNOSIS — M7918 Myalgia, other site: Secondary | ICD-10-CM | POA: Diagnosis not present

## 2020-06-25 DIAGNOSIS — M75102 Unspecified rotator cuff tear or rupture of left shoulder, not specified as traumatic: Secondary | ICD-10-CM | POA: Diagnosis not present

## 2020-07-02 DIAGNOSIS — M75102 Unspecified rotator cuff tear or rupture of left shoulder, not specified as traumatic: Secondary | ICD-10-CM | POA: Diagnosis not present

## 2020-07-02 DIAGNOSIS — S42265A Nondisplaced fracture of lesser tuberosity of left humerus, initial encounter for closed fracture: Secondary | ICD-10-CM | POA: Diagnosis not present

## 2020-07-09 DIAGNOSIS — S42265A Nondisplaced fracture of lesser tuberosity of left humerus, initial encounter for closed fracture: Secondary | ICD-10-CM | POA: Diagnosis not present

## 2020-07-13 DIAGNOSIS — G43109 Migraine with aura, not intractable, without status migrainosus: Secondary | ICD-10-CM | POA: Diagnosis not present

## 2020-07-13 DIAGNOSIS — G4700 Insomnia, unspecified: Secondary | ICD-10-CM | POA: Diagnosis not present

## 2020-07-20 DIAGNOSIS — R29898 Other symptoms and signs involving the musculoskeletal system: Secondary | ICD-10-CM | POA: Diagnosis not present

## 2020-07-20 DIAGNOSIS — M25512 Pain in left shoulder: Secondary | ICD-10-CM | POA: Diagnosis not present

## 2020-08-06 DIAGNOSIS — H269 Unspecified cataract: Secondary | ICD-10-CM | POA: Diagnosis not present

## 2020-08-06 DIAGNOSIS — I10 Essential (primary) hypertension: Secondary | ICD-10-CM | POA: Diagnosis not present

## 2020-08-06 DIAGNOSIS — E7800 Pure hypercholesterolemia, unspecified: Secondary | ICD-10-CM | POA: Diagnosis not present

## 2020-08-06 DIAGNOSIS — G8929 Other chronic pain: Secondary | ICD-10-CM | POA: Diagnosis not present

## 2020-08-06 DIAGNOSIS — G4700 Insomnia, unspecified: Secondary | ICD-10-CM | POA: Diagnosis not present

## 2020-08-12 DIAGNOSIS — M25512 Pain in left shoulder: Secondary | ICD-10-CM | POA: Diagnosis not present

## 2020-08-12 DIAGNOSIS — R29898 Other symptoms and signs involving the musculoskeletal system: Secondary | ICD-10-CM | POA: Diagnosis not present

## 2020-08-12 DIAGNOSIS — S42265A Nondisplaced fracture of lesser tuberosity of left humerus, initial encounter for closed fracture: Secondary | ICD-10-CM | POA: Diagnosis not present

## 2020-08-13 DIAGNOSIS — Z79899 Other long term (current) drug therapy: Secondary | ICD-10-CM | POA: Diagnosis not present

## 2020-08-13 DIAGNOSIS — G894 Chronic pain syndrome: Secondary | ICD-10-CM | POA: Diagnosis not present

## 2020-08-13 DIAGNOSIS — M961 Postlaminectomy syndrome, not elsewhere classified: Secondary | ICD-10-CM | POA: Diagnosis not present

## 2020-08-13 DIAGNOSIS — Z5181 Encounter for therapeutic drug level monitoring: Secondary | ICD-10-CM | POA: Diagnosis not present

## 2020-08-13 DIAGNOSIS — M19011 Primary osteoarthritis, right shoulder: Secondary | ICD-10-CM | POA: Diagnosis not present

## 2020-08-13 DIAGNOSIS — M25511 Pain in right shoulder: Secondary | ICD-10-CM | POA: Diagnosis not present

## 2020-08-13 DIAGNOSIS — M5030 Other cervical disc degeneration, unspecified cervical region: Secondary | ICD-10-CM | POA: Diagnosis not present

## 2020-08-13 DIAGNOSIS — G4486 Cervicogenic headache: Secondary | ICD-10-CM | POA: Diagnosis not present

## 2020-08-13 DIAGNOSIS — M47812 Spondylosis without myelopathy or radiculopathy, cervical region: Secondary | ICD-10-CM | POA: Diagnosis not present

## 2020-09-15 DIAGNOSIS — I10 Essential (primary) hypertension: Secondary | ICD-10-CM | POA: Diagnosis not present

## 2020-09-15 DIAGNOSIS — E041 Nontoxic single thyroid nodule: Secondary | ICD-10-CM | POA: Diagnosis not present

## 2020-09-15 DIAGNOSIS — E7800 Pure hypercholesterolemia, unspecified: Secondary | ICD-10-CM | POA: Diagnosis not present

## 2020-09-15 DIAGNOSIS — E559 Vitamin D deficiency, unspecified: Secondary | ICD-10-CM | POA: Diagnosis not present

## 2020-09-16 DIAGNOSIS — Z0000 Encounter for general adult medical examination without abnormal findings: Secondary | ICD-10-CM | POA: Diagnosis not present

## 2020-09-16 DIAGNOSIS — I10 Essential (primary) hypertension: Secondary | ICD-10-CM | POA: Diagnosis not present

## 2020-09-16 DIAGNOSIS — E559 Vitamin D deficiency, unspecified: Secondary | ICD-10-CM | POA: Diagnosis not present

## 2020-09-16 DIAGNOSIS — G720 Drug-induced myopathy: Secondary | ICD-10-CM | POA: Diagnosis not present

## 2020-09-16 DIAGNOSIS — E7800 Pure hypercholesterolemia, unspecified: Secondary | ICD-10-CM | POA: Diagnosis not present

## 2020-10-08 DIAGNOSIS — M961 Postlaminectomy syndrome, not elsewhere classified: Secondary | ICD-10-CM | POA: Diagnosis not present

## 2020-10-08 DIAGNOSIS — G4486 Cervicogenic headache: Secondary | ICD-10-CM | POA: Diagnosis not present

## 2020-10-08 DIAGNOSIS — M19011 Primary osteoarthritis, right shoulder: Secondary | ICD-10-CM | POA: Diagnosis not present

## 2020-10-08 DIAGNOSIS — M7918 Myalgia, other site: Secondary | ICD-10-CM | POA: Diagnosis not present

## 2020-10-08 DIAGNOSIS — M5030 Other cervical disc degeneration, unspecified cervical region: Secondary | ICD-10-CM | POA: Diagnosis not present

## 2020-10-08 DIAGNOSIS — M47812 Spondylosis without myelopathy or radiculopathy, cervical region: Secondary | ICD-10-CM | POA: Diagnosis not present

## 2020-10-08 DIAGNOSIS — M25511 Pain in right shoulder: Secondary | ICD-10-CM | POA: Diagnosis not present

## 2020-10-08 DIAGNOSIS — G894 Chronic pain syndrome: Secondary | ICD-10-CM | POA: Diagnosis not present

## 2020-10-08 DIAGNOSIS — M898X1 Other specified disorders of bone, shoulder: Secondary | ICD-10-CM | POA: Diagnosis not present

## 2020-11-05 DIAGNOSIS — I10 Essential (primary) hypertension: Secondary | ICD-10-CM | POA: Diagnosis not present

## 2020-11-05 DIAGNOSIS — E7800 Pure hypercholesterolemia, unspecified: Secondary | ICD-10-CM | POA: Diagnosis not present

## 2020-11-05 DIAGNOSIS — N183 Chronic kidney disease, stage 3 unspecified: Secondary | ICD-10-CM | POA: Diagnosis not present

## 2020-11-05 DIAGNOSIS — G4700 Insomnia, unspecified: Secondary | ICD-10-CM | POA: Diagnosis not present

## 2020-11-05 DIAGNOSIS — G8929 Other chronic pain: Secondary | ICD-10-CM | POA: Diagnosis not present

## 2020-11-05 DIAGNOSIS — H269 Unspecified cataract: Secondary | ICD-10-CM | POA: Diagnosis not present

## 2020-12-10 DIAGNOSIS — G894 Chronic pain syndrome: Secondary | ICD-10-CM | POA: Diagnosis not present

## 2020-12-10 DIAGNOSIS — G4486 Cervicogenic headache: Secondary | ICD-10-CM | POA: Diagnosis not present

## 2020-12-10 DIAGNOSIS — M5030 Other cervical disc degeneration, unspecified cervical region: Secondary | ICD-10-CM | POA: Diagnosis not present

## 2020-12-10 DIAGNOSIS — M25511 Pain in right shoulder: Secondary | ICD-10-CM | POA: Diagnosis not present

## 2020-12-10 DIAGNOSIS — M19011 Primary osteoarthritis, right shoulder: Secondary | ICD-10-CM | POA: Diagnosis not present

## 2020-12-10 DIAGNOSIS — M961 Postlaminectomy syndrome, not elsewhere classified: Secondary | ICD-10-CM | POA: Diagnosis not present

## 2020-12-10 DIAGNOSIS — M47812 Spondylosis without myelopathy or radiculopathy, cervical region: Secondary | ICD-10-CM | POA: Diagnosis not present

## 2020-12-23 DIAGNOSIS — R93429 Abnormal radiologic findings on diagnostic imaging of unspecified kidney: Secondary | ICD-10-CM | POA: Diagnosis not present

## 2020-12-23 DIAGNOSIS — N200 Calculus of kidney: Secondary | ICD-10-CM | POA: Diagnosis not present

## 2021-01-16 DIAGNOSIS — G8929 Other chronic pain: Secondary | ICD-10-CM | POA: Diagnosis not present

## 2021-01-16 DIAGNOSIS — E7800 Pure hypercholesterolemia, unspecified: Secondary | ICD-10-CM | POA: Diagnosis not present

## 2021-01-16 DIAGNOSIS — H269 Unspecified cataract: Secondary | ICD-10-CM | POA: Diagnosis not present

## 2021-01-16 DIAGNOSIS — N183 Chronic kidney disease, stage 3 unspecified: Secondary | ICD-10-CM | POA: Diagnosis not present

## 2021-01-16 DIAGNOSIS — I10 Essential (primary) hypertension: Secondary | ICD-10-CM | POA: Diagnosis not present

## 2021-01-16 DIAGNOSIS — G4700 Insomnia, unspecified: Secondary | ICD-10-CM | POA: Diagnosis not present

## 2021-02-11 DIAGNOSIS — G894 Chronic pain syndrome: Secondary | ICD-10-CM | POA: Diagnosis not present

## 2021-02-11 DIAGNOSIS — M961 Postlaminectomy syndrome, not elsewhere classified: Secondary | ICD-10-CM | POA: Diagnosis not present

## 2021-02-11 DIAGNOSIS — G4486 Cervicogenic headache: Secondary | ICD-10-CM | POA: Diagnosis not present

## 2021-02-11 DIAGNOSIS — M25511 Pain in right shoulder: Secondary | ICD-10-CM | POA: Diagnosis not present

## 2021-02-11 DIAGNOSIS — M47812 Spondylosis without myelopathy or radiculopathy, cervical region: Secondary | ICD-10-CM | POA: Diagnosis not present

## 2021-02-11 DIAGNOSIS — Z5181 Encounter for therapeutic drug level monitoring: Secondary | ICD-10-CM | POA: Diagnosis not present

## 2021-02-11 DIAGNOSIS — M19011 Primary osteoarthritis, right shoulder: Secondary | ICD-10-CM | POA: Diagnosis not present

## 2021-02-11 DIAGNOSIS — M5030 Other cervical disc degeneration, unspecified cervical region: Secondary | ICD-10-CM | POA: Diagnosis not present

## 2021-02-11 DIAGNOSIS — Z79899 Other long term (current) drug therapy: Secondary | ICD-10-CM | POA: Diagnosis not present

## 2021-04-08 DIAGNOSIS — M25511 Pain in right shoulder: Secondary | ICD-10-CM | POA: Diagnosis not present

## 2021-04-08 DIAGNOSIS — M19011 Primary osteoarthritis, right shoulder: Secondary | ICD-10-CM | POA: Diagnosis not present

## 2021-04-08 DIAGNOSIS — M5030 Other cervical disc degeneration, unspecified cervical region: Secondary | ICD-10-CM | POA: Diagnosis not present

## 2021-04-08 DIAGNOSIS — M961 Postlaminectomy syndrome, not elsewhere classified: Secondary | ICD-10-CM | POA: Diagnosis not present

## 2021-04-08 DIAGNOSIS — G4486 Cervicogenic headache: Secondary | ICD-10-CM | POA: Diagnosis not present

## 2021-04-08 DIAGNOSIS — M47812 Spondylosis without myelopathy or radiculopathy, cervical region: Secondary | ICD-10-CM | POA: Diagnosis not present

## 2021-04-08 DIAGNOSIS — G894 Chronic pain syndrome: Secondary | ICD-10-CM | POA: Diagnosis not present

## 2021-06-03 DIAGNOSIS — M25511 Pain in right shoulder: Secondary | ICD-10-CM | POA: Diagnosis not present

## 2021-06-03 DIAGNOSIS — G894 Chronic pain syndrome: Secondary | ICD-10-CM | POA: Diagnosis not present

## 2021-06-03 DIAGNOSIS — M7918 Myalgia, other site: Secondary | ICD-10-CM | POA: Diagnosis not present

## 2021-06-03 DIAGNOSIS — M19011 Primary osteoarthritis, right shoulder: Secondary | ICD-10-CM | POA: Diagnosis not present

## 2021-06-03 DIAGNOSIS — M898X1 Other specified disorders of bone, shoulder: Secondary | ICD-10-CM | POA: Diagnosis not present

## 2021-06-03 DIAGNOSIS — M47812 Spondylosis without myelopathy or radiculopathy, cervical region: Secondary | ICD-10-CM | POA: Diagnosis not present

## 2021-06-03 DIAGNOSIS — M961 Postlaminectomy syndrome, not elsewhere classified: Secondary | ICD-10-CM | POA: Diagnosis not present

## 2021-06-03 DIAGNOSIS — G4486 Cervicogenic headache: Secondary | ICD-10-CM | POA: Diagnosis not present

## 2021-06-03 DIAGNOSIS — M5030 Other cervical disc degeneration, unspecified cervical region: Secondary | ICD-10-CM | POA: Diagnosis not present

## 2021-07-05 DIAGNOSIS — H6991 Unspecified Eustachian tube disorder, right ear: Secondary | ICD-10-CM | POA: Diagnosis not present

## 2021-07-05 DIAGNOSIS — H9201 Otalgia, right ear: Secondary | ICD-10-CM | POA: Diagnosis not present

## 2021-07-14 DIAGNOSIS — M47812 Spondylosis without myelopathy or radiculopathy, cervical region: Secondary | ICD-10-CM | POA: Diagnosis not present

## 2021-07-14 DIAGNOSIS — G894 Chronic pain syndrome: Secondary | ICD-10-CM | POA: Diagnosis not present

## 2021-07-16 DIAGNOSIS — N281 Cyst of kidney, acquired: Secondary | ICD-10-CM | POA: Diagnosis not present

## 2021-07-16 DIAGNOSIS — I714 Abdominal aortic aneurysm, without rupture, unspecified: Secondary | ICD-10-CM | POA: Diagnosis not present

## 2021-07-16 DIAGNOSIS — K5900 Constipation, unspecified: Secondary | ICD-10-CM | POA: Diagnosis not present

## 2021-07-16 DIAGNOSIS — N261 Atrophy of kidney (terminal): Secondary | ICD-10-CM | POA: Diagnosis not present

## 2021-07-16 DIAGNOSIS — N200 Calculus of kidney: Secondary | ICD-10-CM | POA: Diagnosis not present

## 2021-07-16 DIAGNOSIS — R590 Localized enlarged lymph nodes: Secondary | ICD-10-CM | POA: Diagnosis not present

## 2021-07-16 DIAGNOSIS — K5790 Diverticulosis of intestine, part unspecified, without perforation or abscess without bleeding: Secondary | ICD-10-CM | POA: Diagnosis not present

## 2021-07-16 DIAGNOSIS — N289 Disorder of kidney and ureter, unspecified: Secondary | ICD-10-CM | POA: Diagnosis not present

## 2021-07-29 DIAGNOSIS — M47812 Spondylosis without myelopathy or radiculopathy, cervical region: Secondary | ICD-10-CM | POA: Diagnosis not present

## 2021-08-04 DIAGNOSIS — M19011 Primary osteoarthritis, right shoulder: Secondary | ICD-10-CM | POA: Diagnosis not present

## 2021-08-04 DIAGNOSIS — M25511 Pain in right shoulder: Secondary | ICD-10-CM | POA: Diagnosis not present

## 2021-08-04 DIAGNOSIS — M5030 Other cervical disc degeneration, unspecified cervical region: Secondary | ICD-10-CM | POA: Diagnosis not present

## 2021-08-04 DIAGNOSIS — G894 Chronic pain syndrome: Secondary | ICD-10-CM | POA: Diagnosis not present

## 2021-08-04 DIAGNOSIS — Z5181 Encounter for therapeutic drug level monitoring: Secondary | ICD-10-CM | POA: Diagnosis not present

## 2021-08-04 DIAGNOSIS — Z79899 Other long term (current) drug therapy: Secondary | ICD-10-CM | POA: Diagnosis not present

## 2021-08-04 DIAGNOSIS — G4486 Cervicogenic headache: Secondary | ICD-10-CM | POA: Diagnosis not present

## 2021-08-04 DIAGNOSIS — M7918 Myalgia, other site: Secondary | ICD-10-CM | POA: Diagnosis not present

## 2021-08-04 DIAGNOSIS — M961 Postlaminectomy syndrome, not elsewhere classified: Secondary | ICD-10-CM | POA: Diagnosis not present

## 2021-08-04 DIAGNOSIS — M47812 Spondylosis without myelopathy or radiculopathy, cervical region: Secondary | ICD-10-CM | POA: Diagnosis not present

## 2021-08-04 DIAGNOSIS — M898X1 Other specified disorders of bone, shoulder: Secondary | ICD-10-CM | POA: Diagnosis not present

## 2021-08-30 DIAGNOSIS — N2889 Other specified disorders of kidney and ureter: Secondary | ICD-10-CM | POA: Diagnosis not present

## 2021-09-22 DIAGNOSIS — I10 Essential (primary) hypertension: Secondary | ICD-10-CM | POA: Diagnosis not present

## 2021-09-22 DIAGNOSIS — Z961 Presence of intraocular lens: Secondary | ICD-10-CM | POA: Diagnosis not present

## 2021-09-30 IMAGING — US US ABDOMEN LIMITED
1 series · 14 of 25 positions shown · non-contrast
Comparison: CT 12/13/2018

CLINICAL DATA: Dilated CBD seen on same day CT

EXAM:
ULTRASOUND ABDOMEN LIMITED RIGHT UPPER QUADRANT

[Series 1: us abdomen limited · 14 of 30 slices shown]
[im 1/30]
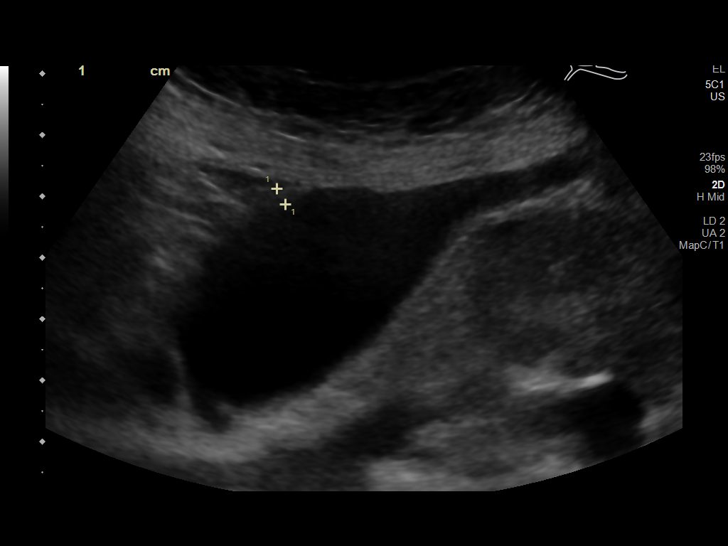
[im 3/30]
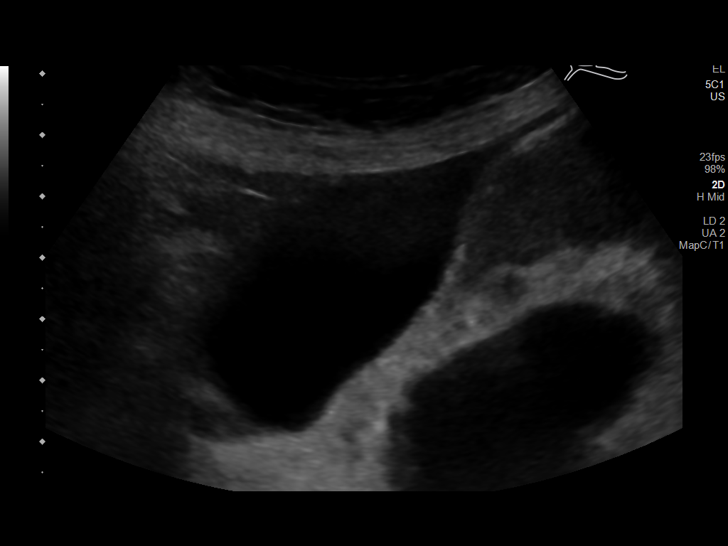
[im 5/30]
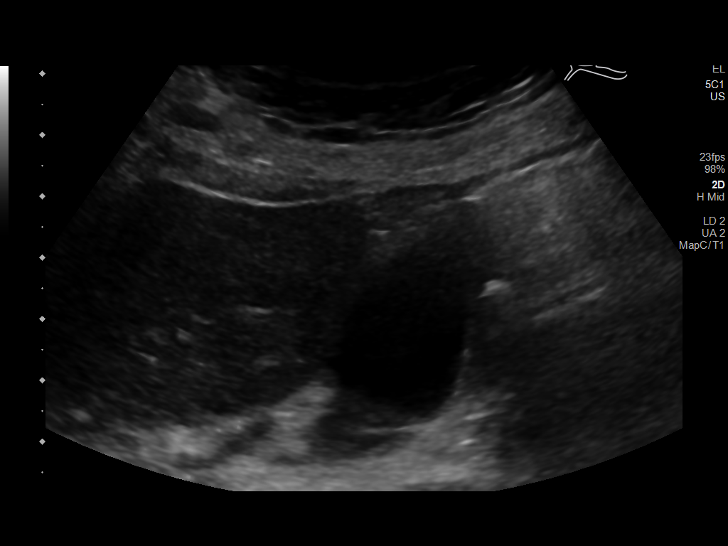
[im 8/30]
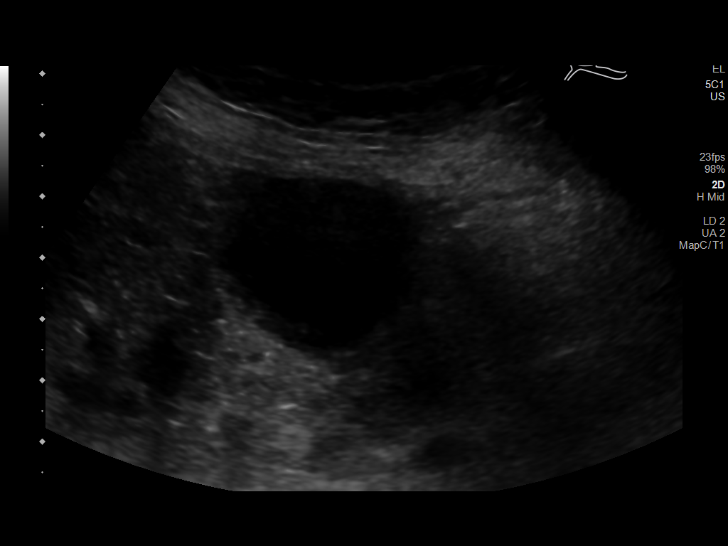
[im 10/30]
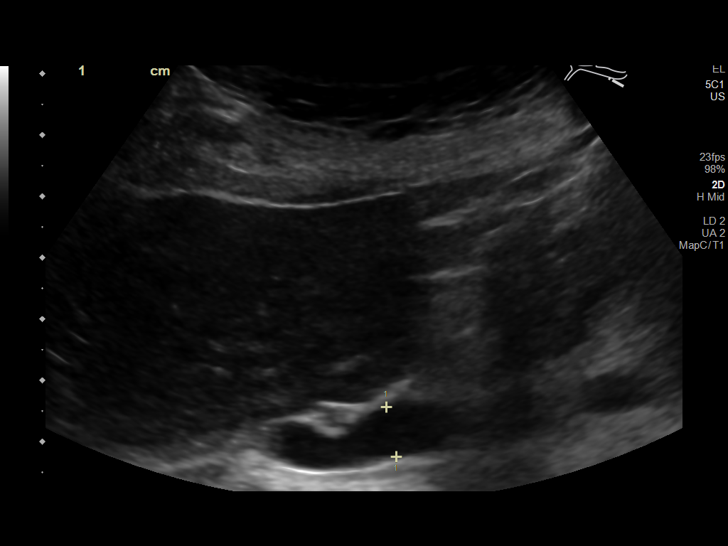
[im 11/30]
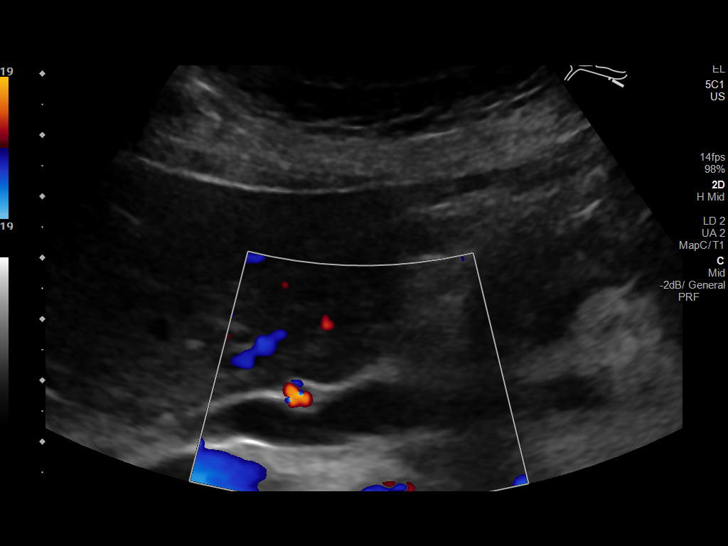
[im 14/30]
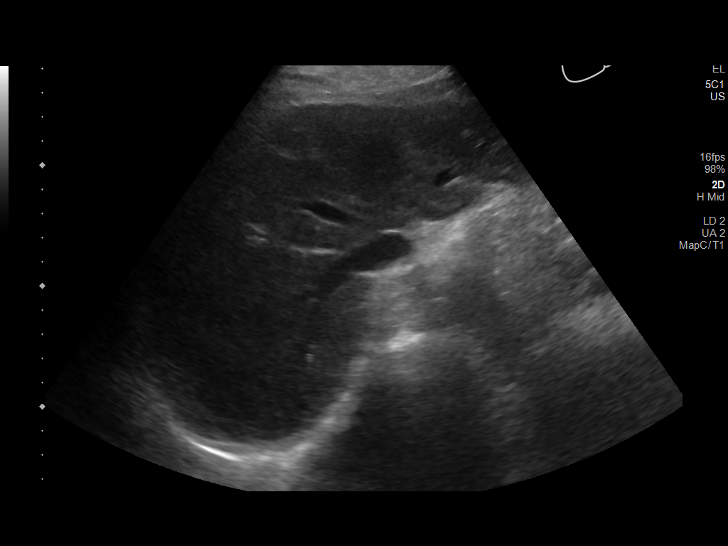
[im 16/30]
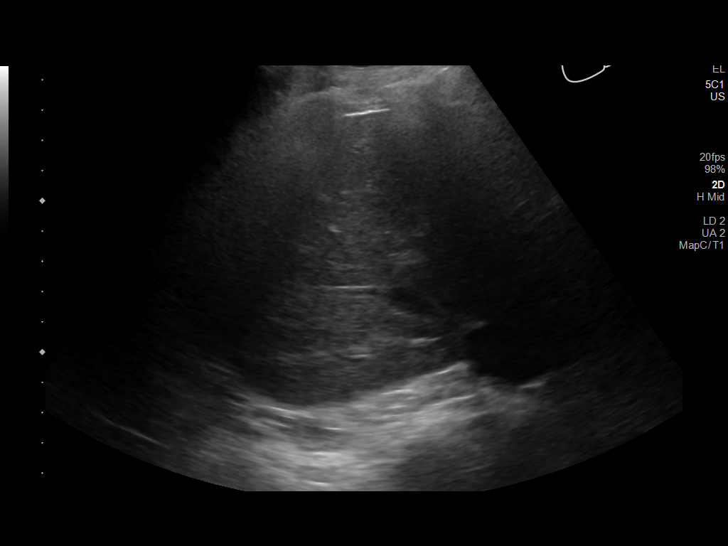
[im 19/30]
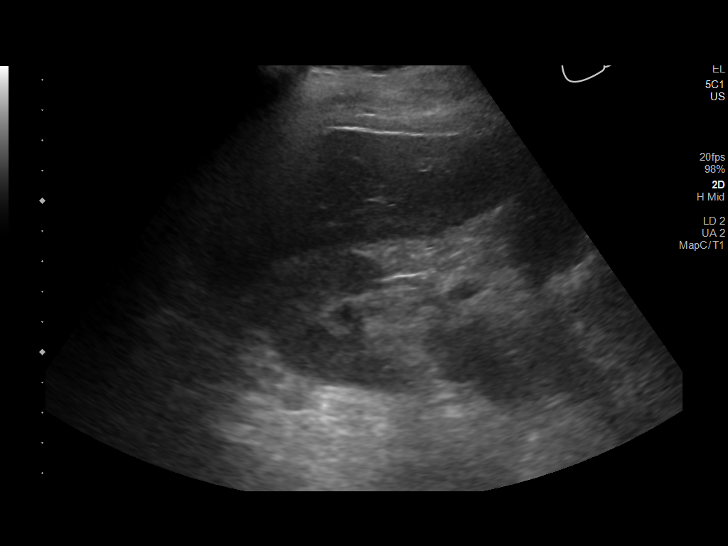
[im 20/30]
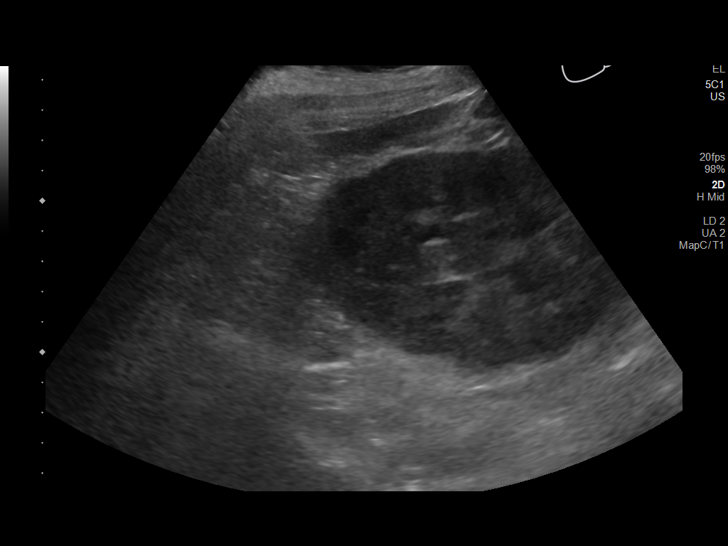
[im 22/30]
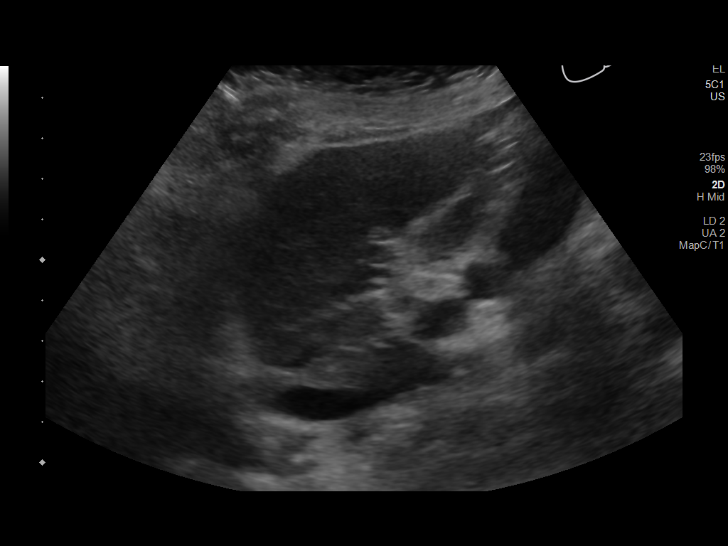
[im 25/30]
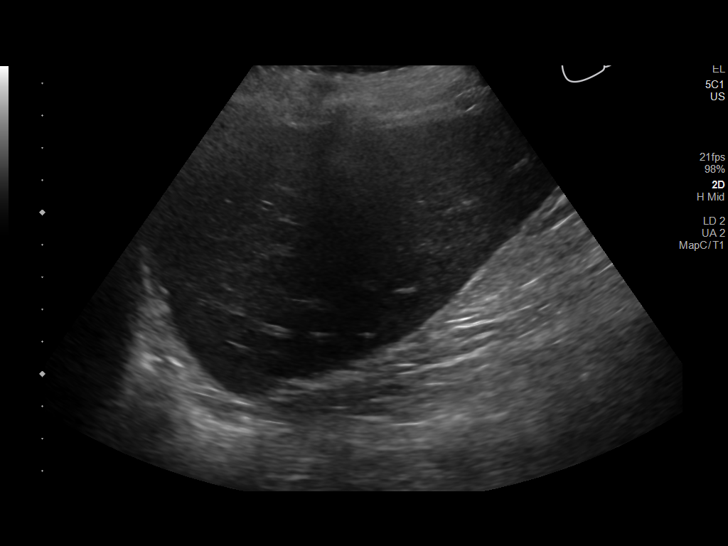
[im 27/30]
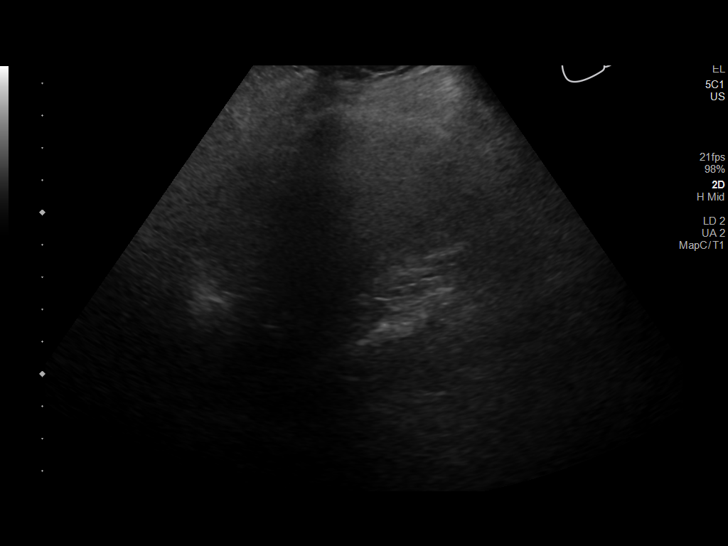
[im 30/30]
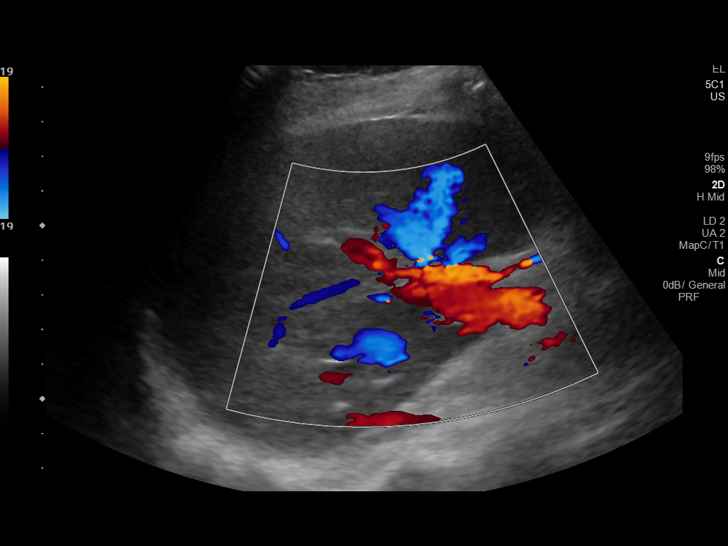

[14 of 25 positions shown; findings below may reference images not displayed]

FINDINGS: Gallbladder:

No sonographically evident gallstones or biliary sludge. No
gallbladder wall thickening. Sonographic Murphy sign is reportedly
negative per sonographer. Prominent fold at the neck of the
gallbladder.

Common bile duct:

Diameter: 8.3 mm, dilated

Liver:

No focal lesion identified. Within normal limits in parenchymal
echogenicity. Portal vein is patent on color Doppler imaging with
normal direction of blood flow towards the liver.

Other: None.
IMPRESSION: Mild nonspecific extrahepatic biliary ductal dilatation without
visible choledocholithiasis or biliary sludge. Recommend correlation
with serologies and if there is persisting clinical concern for
choledocholithiasis, MRCP could be obtained.

## 2021-09-30 IMAGING — CT CT ABD-PELV W/O CM
2 of 4 series · 14 of 46 positions shown, 16 images · non-contrast
Comparison: None

CLINICAL DATA: Abdominal distension with 6 weeks of intermittent
nausea and vomiting

EXAM:
CT ABDOMEN AND PELVIS WITHOUT CONTRAST
TECHNIQUE: Multidetector CT imaging of the abdomen and pelvis was performed
following the standard protocol without IV contrast.

[Series 3: a/p w/o 5mm (person_name) · axial · non-contrast · 0.84mm/px · z∈[+724,+1089]mm · 11 of 87 slices shown, 13 images]
[im 7/87  soft-tissue]
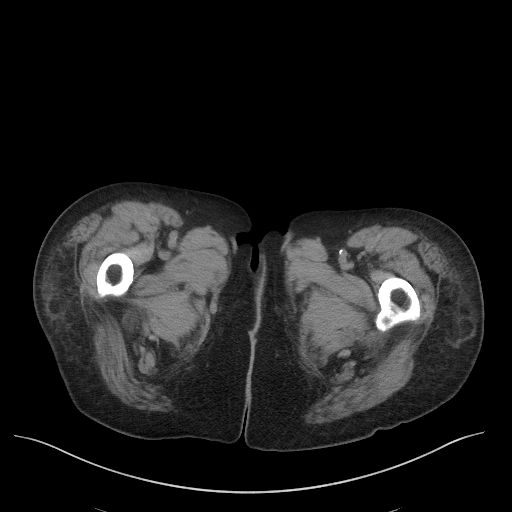
[im 7/87  bone]
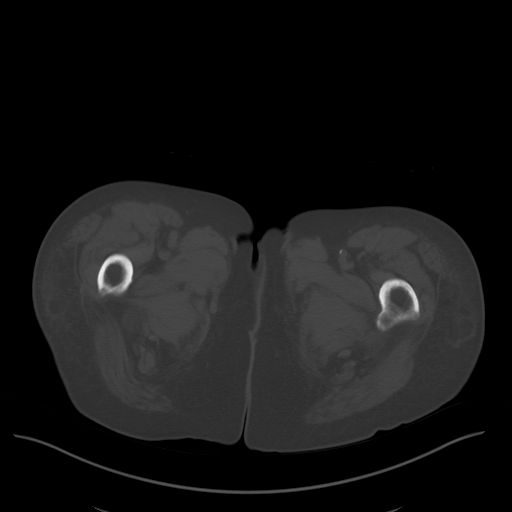
[im 14/87  soft-tissue]
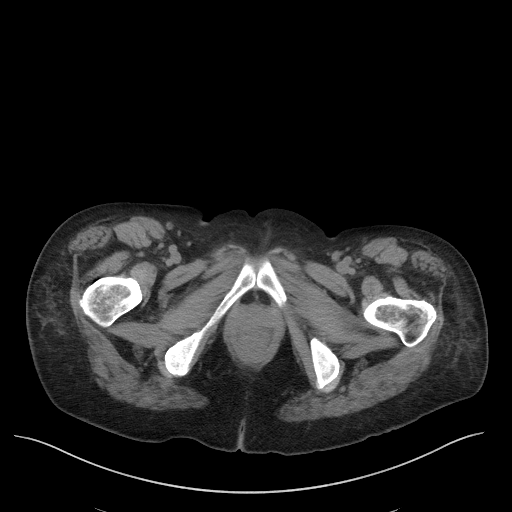
[im 21/87  soft-tissue]
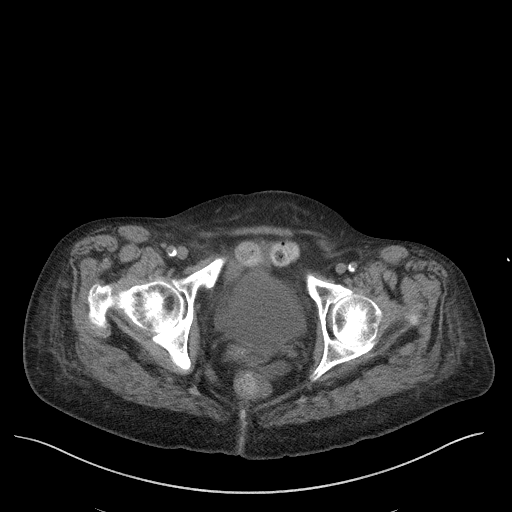
[im 28/87  soft-tissue]
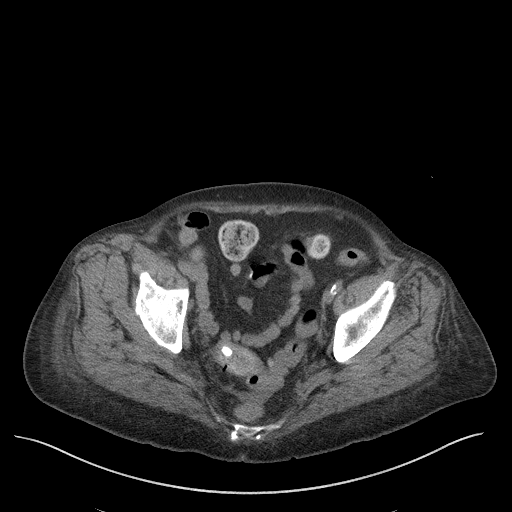
[im 35/87  soft-tissue]
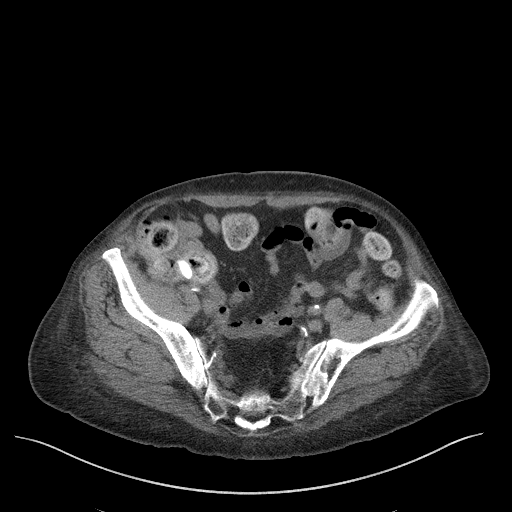
[im 45/87  soft-tissue]
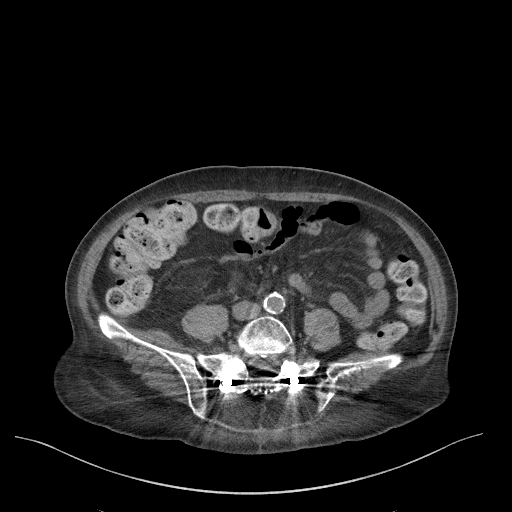
[im 52/87  soft-tissue]
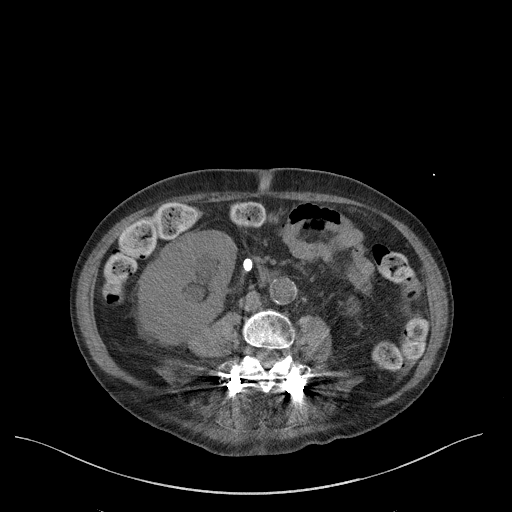
[im 59/87  soft-tissue]
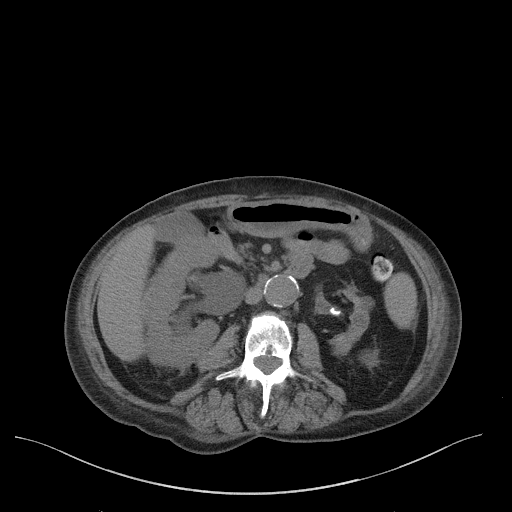
[im 66/87  soft-tissue]
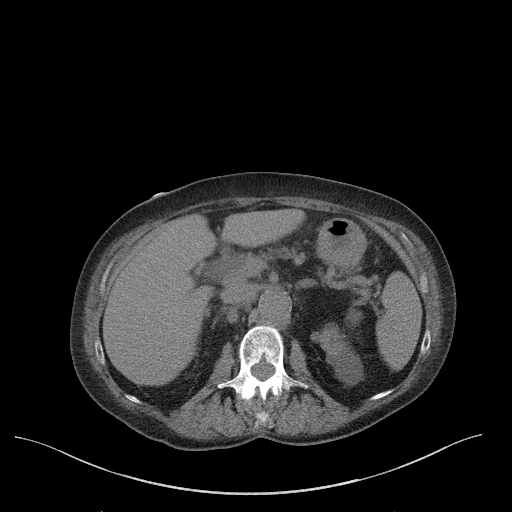
[im 66/87  bone]
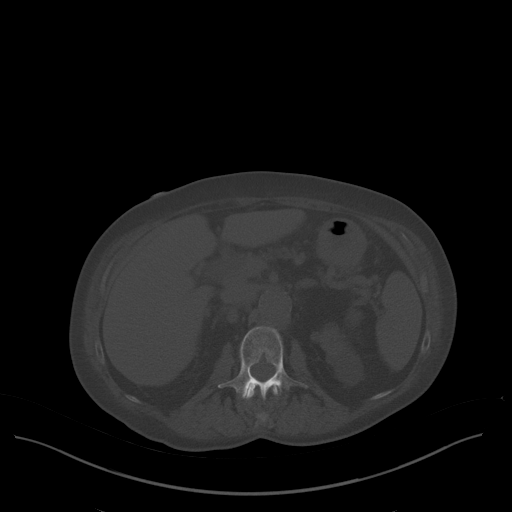
[im 73/87  soft-tissue]
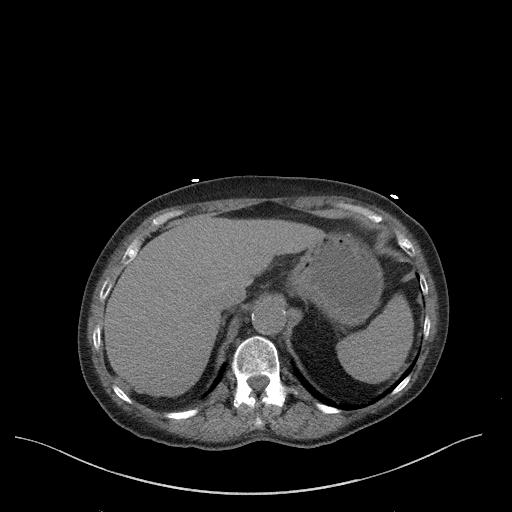
[im 80/87  soft-tissue]
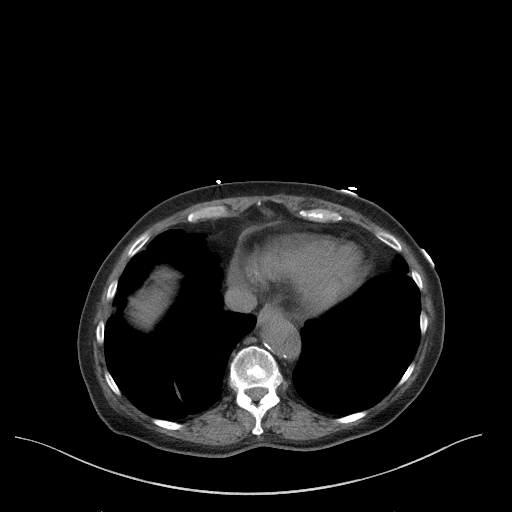

[Series 5: a/p w/o cor · coronal · non-contrast · 0.81mm/px · 3 of 145 slices shown]
[im 49/145  soft-tissue]
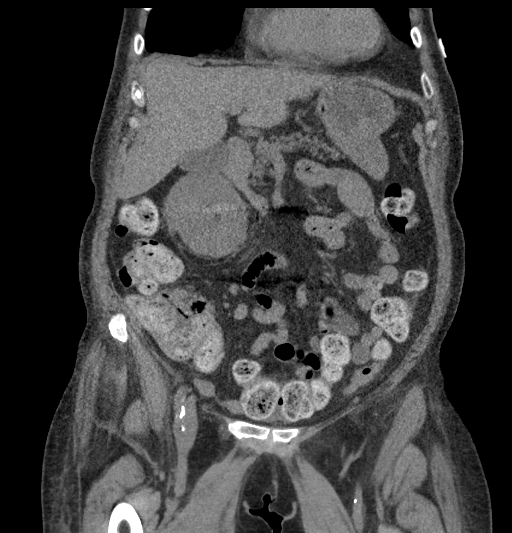
[im 65/145  soft-tissue]
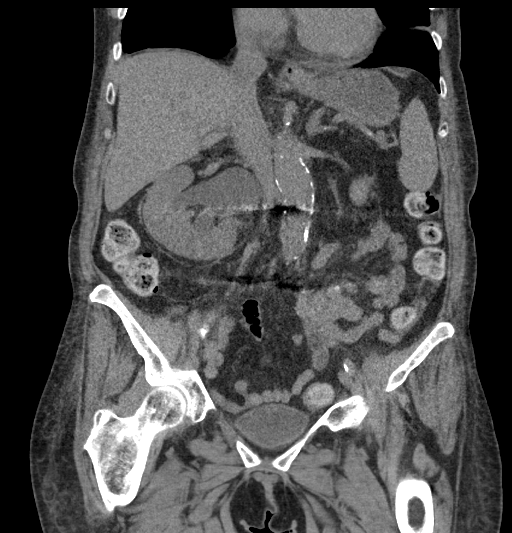
[im 81/145  soft-tissue]
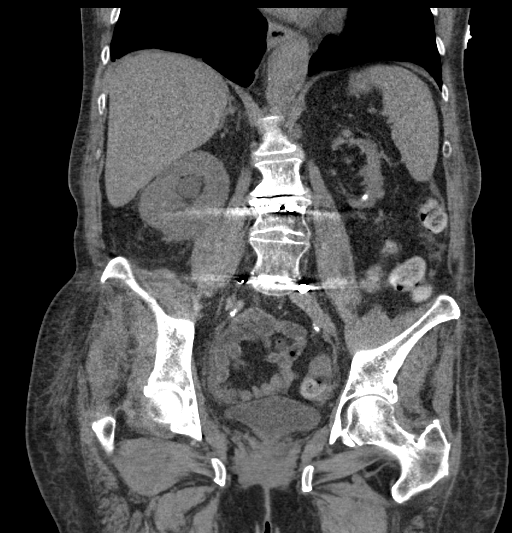

[14 of 46 positions shown; findings below may reference images not displayed]

FINDINGS: Lower chest: Bandlike areas of opacity in both lung bases likely
reflect subsegmental atelectasis and/or scarring.

Hepatobiliary: No focal liver abnormality is seen. Gradient density
within the gallbladder lumen likely reflects biliary sludge. No
visible calcified gallstones or gallbladder wall thickening. Common
bile duct is borderline dilated at 8 mm.

Pancreas: Fatty replacement of the pancreas. No pancreatic ductal
dilatation or surrounding inflammatory changes.

Spleen: Normal in size without focal abnormality.

Adrenals/Urinary Tract: Normal adrenal glands.

There is atrophy and extensive vascular calcification of the left
kidney with a several fluid attenuation cysts. Additional
hypoattenuating foci in the left kidney are too small to fully
characterize on CT imaging but statistically likely benign.

The right kidney is markedly enlarged, some of which may be
compensatory hypertrophy however there is severe right
hydronephrosis to the level of the 11 mm calculus at the right
ureteropelvic junction. Distal ureter is more normal caliber.

Urinary bladder is largely decompressed at the time of exam and
therefore poorly evaluated by CT imaging.

Stomach/Bowel: Distal esophagus, stomach and duodenal sweep are
unremarkable. No small bowel wall thickening or dilatation. No
evidence of obstruction. There are postsurgical changes at the tip
of the cecum with several surgical clips however a short tubular
structure extends from the cecal tip with a large 15 mm
calcification at the base suggesting a dilated, hyperemic
appendiceal stump. Minimal stranding is seen adjacent to this
location. Remainder of the colon has a more normal appearance. No
colonic dilatation or wall thickening.

Vascular/Lymphatic: Atherosclerotic plaque is seen throughout the
aorta and branch vessels. Mild fusiform ectasia of the infrarenal
abdominal aorta to 2.6 cm.

Reproductive: Uterus is surgically absent. No concerning adnexal
lesions.

Other: Mild circumferential body wall edema. No bowel containing
hernia. Small volume low-attenuation free fluid tracking inferiorly
to the deep pelvis, likely reactive. No free intraperitoneal air.

Musculoskeletal: Postsurgical changes from prior L4-S1 posterior
spinal fusion with transpedicular screws at the L4 and S1 levels and
bony fusion across the posterior elements. Multilevel degenerative
changes are present in the imaged portions of the spine. Adjacent
segment disease noted at L3-4. Mild levocurvature of the lumbar
spine.
IMPRESSION: 1. Severe right hydronephrosis to the level of a 11 mm calculus at
the right ureteropelvic junction. Extensive adjacent retroperitoneal
stranding and inflammation.
2. Postsurgical changes at the tip of the cecum compatible with
reported history of appendectomy however a short tubular structure
extending from the cecal tip measuring 2.7 cm in diameter with a
large 15 mm calcification at the base suggesting a dilated,
hyperemic appendiceal stump. Minimal stranding is seen adjacent to
this location. Findings are concerning for acute appendicitis.
Recommend surgical consultation. Additionally given size of this
dilation, a mucocele is not fully excluded.
3. Gradient density within the gallbladder lumen likely reflects
biliary sludge. No visible calcified gallstones or gallbladder wall
thickening. Borderline biliary ductal dilatation. If there is
clinical concern for choledocholithiasis or acute cholecystitis,
consider further evaluation with right upper quadrant ultrasound.
4. Mild fusiform ectasia of the infrarenal abdominal aorta to
cm. Ectatic abdominal aorta at risk for aneurysm development.
Recommend followup by ultrasound in 5 years. This recommendation
follows ACR consensus guidelines: White Paper of the ACR Incidental
Findings Committee II on Vascular Findings. [HOSPITAL] 0160;
[DATE]. Aortic aneurysm NOS (7DMXU-661.9)
5. Circumferential body wall edema, trace pericardial fluid, and
small volume of fluid in the deep pelvis. Features suggesting mild
anasarca/volume third-spacing.
6. Aortic Atherosclerosis (7DMXU-BIJ.J).
7. Prior L4-S1 posterior spinal fusion without hardware failure or
loosening.

These results were called by telephone at the time of interpretation
on 12/13/2018 at [DATE] to provider UTA HAGA , who
verbally acknowledged these results.

## 2021-10-11 DIAGNOSIS — M19011 Primary osteoarthritis, right shoulder: Secondary | ICD-10-CM | POA: Diagnosis not present

## 2021-10-11 DIAGNOSIS — M961 Postlaminectomy syndrome, not elsewhere classified: Secondary | ICD-10-CM | POA: Diagnosis not present

## 2021-10-11 DIAGNOSIS — M898X1 Other specified disorders of bone, shoulder: Secondary | ICD-10-CM | POA: Diagnosis not present

## 2021-10-11 DIAGNOSIS — G4486 Cervicogenic headache: Secondary | ICD-10-CM | POA: Diagnosis not present

## 2021-10-11 DIAGNOSIS — M47812 Spondylosis without myelopathy or radiculopathy, cervical region: Secondary | ICD-10-CM | POA: Diagnosis not present

## 2021-10-11 DIAGNOSIS — M7918 Myalgia, other site: Secondary | ICD-10-CM | POA: Diagnosis not present

## 2021-10-11 DIAGNOSIS — M5030 Other cervical disc degeneration, unspecified cervical region: Secondary | ICD-10-CM | POA: Diagnosis not present

## 2021-10-11 DIAGNOSIS — M25511 Pain in right shoulder: Secondary | ICD-10-CM | POA: Diagnosis not present

## 2021-10-11 DIAGNOSIS — G894 Chronic pain syndrome: Secondary | ICD-10-CM | POA: Diagnosis not present

## 2021-11-17 DIAGNOSIS — E7800 Pure hypercholesterolemia, unspecified: Secondary | ICD-10-CM | POA: Diagnosis not present

## 2021-11-17 DIAGNOSIS — I10 Essential (primary) hypertension: Secondary | ICD-10-CM | POA: Diagnosis not present

## 2021-11-17 DIAGNOSIS — Z23 Encounter for immunization: Secondary | ICD-10-CM | POA: Diagnosis not present

## 2021-11-17 DIAGNOSIS — Z0000 Encounter for general adult medical examination without abnormal findings: Secondary | ICD-10-CM | POA: Diagnosis not present

## 2021-11-17 DIAGNOSIS — E559 Vitamin D deficiency, unspecified: Secondary | ICD-10-CM | POA: Diagnosis not present

## 2021-11-17 DIAGNOSIS — R110 Nausea: Secondary | ICD-10-CM | POA: Diagnosis not present

## 2021-11-17 DIAGNOSIS — G4700 Insomnia, unspecified: Secondary | ICD-10-CM | POA: Diagnosis not present

## 2021-11-19 DIAGNOSIS — E559 Vitamin D deficiency, unspecified: Secondary | ICD-10-CM | POA: Diagnosis not present

## 2021-11-19 DIAGNOSIS — E7800 Pure hypercholesterolemia, unspecified: Secondary | ICD-10-CM | POA: Diagnosis not present

## 2021-12-06 DIAGNOSIS — M898X1 Other specified disorders of bone, shoulder: Secondary | ICD-10-CM | POA: Diagnosis not present

## 2021-12-06 DIAGNOSIS — M25511 Pain in right shoulder: Secondary | ICD-10-CM | POA: Diagnosis not present

## 2021-12-06 DIAGNOSIS — M47812 Spondylosis without myelopathy or radiculopathy, cervical region: Secondary | ICD-10-CM | POA: Diagnosis not present

## 2021-12-06 DIAGNOSIS — M19011 Primary osteoarthritis, right shoulder: Secondary | ICD-10-CM | POA: Diagnosis not present

## 2021-12-06 DIAGNOSIS — M5030 Other cervical disc degeneration, unspecified cervical region: Secondary | ICD-10-CM | POA: Diagnosis not present

## 2021-12-06 DIAGNOSIS — G4486 Cervicogenic headache: Secondary | ICD-10-CM | POA: Diagnosis not present

## 2021-12-06 DIAGNOSIS — M961 Postlaminectomy syndrome, not elsewhere classified: Secondary | ICD-10-CM | POA: Diagnosis not present

## 2021-12-06 DIAGNOSIS — G894 Chronic pain syndrome: Secondary | ICD-10-CM | POA: Diagnosis not present

## 2021-12-06 DIAGNOSIS — M7918 Myalgia, other site: Secondary | ICD-10-CM | POA: Diagnosis not present

## 2021-12-16 DIAGNOSIS — M47816 Spondylosis without myelopathy or radiculopathy, lumbar region: Secondary | ICD-10-CM | POA: Diagnosis not present

## 2021-12-16 DIAGNOSIS — N2889 Other specified disorders of kidney and ureter: Secondary | ICD-10-CM | POA: Diagnosis not present

## 2021-12-16 DIAGNOSIS — M961 Postlaminectomy syndrome, not elsewhere classified: Secondary | ICD-10-CM | POA: Diagnosis not present

## 2021-12-16 DIAGNOSIS — M4185 Other forms of scoliosis, thoracolumbar region: Secondary | ICD-10-CM | POA: Diagnosis not present

## 2021-12-16 DIAGNOSIS — Z9889 Other specified postprocedural states: Secondary | ICD-10-CM | POA: Diagnosis not present

## 2021-12-24 DIAGNOSIS — Z1212 Encounter for screening for malignant neoplasm of rectum: Secondary | ICD-10-CM | POA: Diagnosis not present

## 2021-12-24 DIAGNOSIS — Z1211 Encounter for screening for malignant neoplasm of colon: Secondary | ICD-10-CM | POA: Diagnosis not present

## 2022-01-05 DIAGNOSIS — M47812 Spondylosis without myelopathy or radiculopathy, cervical region: Secondary | ICD-10-CM | POA: Diagnosis not present

## 2022-01-28 DIAGNOSIS — U071 COVID-19: Secondary | ICD-10-CM | POA: Diagnosis not present

## 2022-03-03 DIAGNOSIS — G894 Chronic pain syndrome: Secondary | ICD-10-CM | POA: Diagnosis not present

## 2022-03-03 DIAGNOSIS — M898X1 Other specified disorders of bone, shoulder: Secondary | ICD-10-CM | POA: Diagnosis not present

## 2022-03-03 DIAGNOSIS — M47812 Spondylosis without myelopathy or radiculopathy, cervical region: Secondary | ICD-10-CM | POA: Diagnosis not present

## 2022-03-03 DIAGNOSIS — Z5181 Encounter for therapeutic drug level monitoring: Secondary | ICD-10-CM | POA: Diagnosis not present

## 2022-03-03 DIAGNOSIS — M7918 Myalgia, other site: Secondary | ICD-10-CM | POA: Diagnosis not present

## 2022-03-03 DIAGNOSIS — Z79899 Other long term (current) drug therapy: Secondary | ICD-10-CM | POA: Diagnosis not present

## 2022-03-03 DIAGNOSIS — M19011 Primary osteoarthritis, right shoulder: Secondary | ICD-10-CM | POA: Diagnosis not present

## 2022-03-03 DIAGNOSIS — M5030 Other cervical disc degeneration, unspecified cervical region: Secondary | ICD-10-CM | POA: Diagnosis not present

## 2022-03-03 DIAGNOSIS — M961 Postlaminectomy syndrome, not elsewhere classified: Secondary | ICD-10-CM | POA: Diagnosis not present

## 2022-03-03 DIAGNOSIS — M25511 Pain in right shoulder: Secondary | ICD-10-CM | POA: Diagnosis not present

## 2022-03-03 DIAGNOSIS — G4486 Cervicogenic headache: Secondary | ICD-10-CM | POA: Diagnosis not present

## 2022-03-16 DIAGNOSIS — G4700 Insomnia, unspecified: Secondary | ICD-10-CM | POA: Diagnosis not present

## 2022-03-16 DIAGNOSIS — E559 Vitamin D deficiency, unspecified: Secondary | ICD-10-CM | POA: Diagnosis not present

## 2022-03-16 DIAGNOSIS — G894 Chronic pain syndrome: Secondary | ICD-10-CM | POA: Diagnosis not present

## 2022-03-16 DIAGNOSIS — Z79899 Other long term (current) drug therapy: Secondary | ICD-10-CM | POA: Diagnosis not present

## 2022-03-16 DIAGNOSIS — N200 Calculus of kidney: Secondary | ICD-10-CM | POA: Diagnosis not present

## 2022-03-16 DIAGNOSIS — I10 Essential (primary) hypertension: Secondary | ICD-10-CM | POA: Diagnosis not present

## 2022-03-16 DIAGNOSIS — G43911 Migraine, unspecified, intractable, with status migrainosus: Secondary | ICD-10-CM | POA: Diagnosis not present

## 2022-03-16 DIAGNOSIS — R011 Cardiac murmur, unspecified: Secondary | ICD-10-CM | POA: Diagnosis not present

## 2022-04-03 DIAGNOSIS — R5383 Other fatigue: Secondary | ICD-10-CM | POA: Diagnosis not present

## 2022-04-28 DIAGNOSIS — M7918 Myalgia, other site: Secondary | ICD-10-CM | POA: Diagnosis not present

## 2022-04-28 DIAGNOSIS — M898X1 Other specified disorders of bone, shoulder: Secondary | ICD-10-CM | POA: Diagnosis not present

## 2022-04-28 DIAGNOSIS — G4486 Cervicogenic headache: Secondary | ICD-10-CM | POA: Diagnosis not present

## 2022-04-28 DIAGNOSIS — M961 Postlaminectomy syndrome, not elsewhere classified: Secondary | ICD-10-CM | POA: Diagnosis not present

## 2022-04-28 DIAGNOSIS — G894 Chronic pain syndrome: Secondary | ICD-10-CM | POA: Diagnosis not present

## 2022-04-28 DIAGNOSIS — M25511 Pain in right shoulder: Secondary | ICD-10-CM | POA: Diagnosis not present

## 2022-04-28 DIAGNOSIS — M5030 Other cervical disc degeneration, unspecified cervical region: Secondary | ICD-10-CM | POA: Diagnosis not present

## 2022-04-28 DIAGNOSIS — M47812 Spondylosis without myelopathy or radiculopathy, cervical region: Secondary | ICD-10-CM | POA: Diagnosis not present

## 2022-04-28 DIAGNOSIS — M19011 Primary osteoarthritis, right shoulder: Secondary | ICD-10-CM | POA: Diagnosis not present

## 2022-06-20 DIAGNOSIS — N281 Cyst of kidney, acquired: Secondary | ICD-10-CM | POA: Diagnosis not present

## 2022-06-20 DIAGNOSIS — N2889 Other specified disorders of kidney and ureter: Secondary | ICD-10-CM | POA: Diagnosis not present

## 2022-06-20 DIAGNOSIS — N200 Calculus of kidney: Secondary | ICD-10-CM | POA: Diagnosis not present

## 2022-06-20 DIAGNOSIS — N261 Atrophy of kidney (terminal): Secondary | ICD-10-CM | POA: Diagnosis not present

## 2022-06-20 DIAGNOSIS — R590 Localized enlarged lymph nodes: Secondary | ICD-10-CM | POA: Diagnosis not present

## 2022-06-20 DIAGNOSIS — I77811 Abdominal aortic ectasia: Secondary | ICD-10-CM | POA: Diagnosis not present

## 2022-06-23 DIAGNOSIS — M25511 Pain in right shoulder: Secondary | ICD-10-CM | POA: Diagnosis not present

## 2022-06-23 DIAGNOSIS — G4486 Cervicogenic headache: Secondary | ICD-10-CM | POA: Diagnosis not present

## 2022-06-23 DIAGNOSIS — G894 Chronic pain syndrome: Secondary | ICD-10-CM | POA: Diagnosis not present

## 2022-06-23 DIAGNOSIS — M961 Postlaminectomy syndrome, not elsewhere classified: Secondary | ICD-10-CM | POA: Diagnosis not present

## 2022-06-23 DIAGNOSIS — M19011 Primary osteoarthritis, right shoulder: Secondary | ICD-10-CM | POA: Diagnosis not present

## 2022-06-23 DIAGNOSIS — M47812 Spondylosis without myelopathy or radiculopathy, cervical region: Secondary | ICD-10-CM | POA: Diagnosis not present

## 2022-07-11 DIAGNOSIS — N200 Calculus of kidney: Secondary | ICD-10-CM | POA: Diagnosis not present

## 2022-07-11 DIAGNOSIS — D4102 Neoplasm of uncertain behavior of left kidney: Secondary | ICD-10-CM | POA: Diagnosis not present

## 2022-07-11 DIAGNOSIS — R935 Abnormal findings on diagnostic imaging of other abdominal regions, including retroperitoneum: Secondary | ICD-10-CM | POA: Diagnosis not present

## 2022-07-29 DIAGNOSIS — R935 Abnormal findings on diagnostic imaging of other abdominal regions, including retroperitoneum: Secondary | ICD-10-CM | POA: Diagnosis not present

## 2022-07-29 DIAGNOSIS — R590 Localized enlarged lymph nodes: Secondary | ICD-10-CM | POA: Diagnosis not present

## 2022-07-29 DIAGNOSIS — D4102 Neoplasm of uncertain behavior of left kidney: Secondary | ICD-10-CM | POA: Diagnosis not present

## 2022-08-02 DIAGNOSIS — D4102 Neoplasm of uncertain behavior of left kidney: Secondary | ICD-10-CM | POA: Diagnosis not present

## 2022-08-02 DIAGNOSIS — I2510 Atherosclerotic heart disease of native coronary artery without angina pectoris: Secondary | ICD-10-CM | POA: Diagnosis not present

## 2022-08-02 DIAGNOSIS — R918 Other nonspecific abnormal finding of lung field: Secondary | ICD-10-CM | POA: Diagnosis not present

## 2022-08-16 DIAGNOSIS — R590 Localized enlarged lymph nodes: Secondary | ICD-10-CM | POA: Diagnosis not present

## 2022-08-16 DIAGNOSIS — N281 Cyst of kidney, acquired: Secondary | ICD-10-CM | POA: Diagnosis not present

## 2022-08-16 DIAGNOSIS — E278 Other specified disorders of adrenal gland: Secondary | ICD-10-CM | POA: Diagnosis not present

## 2022-08-16 DIAGNOSIS — I700 Atherosclerosis of aorta: Secondary | ICD-10-CM | POA: Diagnosis not present

## 2022-08-19 DIAGNOSIS — R590 Localized enlarged lymph nodes: Secondary | ICD-10-CM | POA: Diagnosis not present

## 2022-08-26 DIAGNOSIS — Z881 Allergy status to other antibiotic agents status: Secondary | ICD-10-CM | POA: Diagnosis not present

## 2022-08-26 DIAGNOSIS — Z981 Arthrodesis status: Secondary | ICD-10-CM | POA: Diagnosis not present

## 2022-08-26 DIAGNOSIS — D3501 Benign neoplasm of right adrenal gland: Secondary | ICD-10-CM | POA: Diagnosis not present

## 2022-08-26 DIAGNOSIS — G894 Chronic pain syndrome: Secondary | ICD-10-CM | POA: Diagnosis not present

## 2022-08-26 DIAGNOSIS — Z79899 Other long term (current) drug therapy: Secondary | ICD-10-CM | POA: Diagnosis not present

## 2022-08-26 DIAGNOSIS — Z882 Allergy status to sulfonamides status: Secondary | ICD-10-CM | POA: Diagnosis not present

## 2022-08-26 DIAGNOSIS — Z9889 Other specified postprocedural states: Secondary | ICD-10-CM | POA: Diagnosis not present

## 2022-08-26 DIAGNOSIS — E278 Other specified disorders of adrenal gland: Secondary | ICD-10-CM | POA: Diagnosis not present

## 2022-08-26 DIAGNOSIS — Z91048 Other nonmedicinal substance allergy status: Secondary | ICD-10-CM | POA: Diagnosis not present

## 2022-08-26 DIAGNOSIS — M797 Fibromyalgia: Secondary | ICD-10-CM | POA: Diagnosis not present

## 2022-08-26 DIAGNOSIS — E559 Vitamin D deficiency, unspecified: Secondary | ICD-10-CM | POA: Diagnosis not present

## 2022-08-26 DIAGNOSIS — I10 Essential (primary) hypertension: Secondary | ICD-10-CM | POA: Diagnosis not present

## 2022-08-26 DIAGNOSIS — R590 Localized enlarged lymph nodes: Secondary | ICD-10-CM | POA: Diagnosis not present

## 2022-08-26 DIAGNOSIS — M8580 Other specified disorders of bone density and structure, unspecified site: Secondary | ICD-10-CM | POA: Diagnosis not present

## 2022-08-26 DIAGNOSIS — Z888 Allergy status to other drugs, medicaments and biological substances status: Secondary | ICD-10-CM | POA: Diagnosis not present

## 2022-08-26 DIAGNOSIS — Z880 Allergy status to penicillin: Secondary | ICD-10-CM | POA: Diagnosis not present

## 2022-08-26 DIAGNOSIS — E7800 Pure hypercholesterolemia, unspecified: Secondary | ICD-10-CM | POA: Diagnosis not present

## 2022-08-26 DIAGNOSIS — Z886 Allergy status to analgesic agent status: Secondary | ICD-10-CM | POA: Diagnosis not present

## 2022-08-26 DIAGNOSIS — M3500 Sicca syndrome, unspecified: Secondary | ICD-10-CM | POA: Diagnosis not present
